# Patient Record
Sex: Female | Born: 1990 | Race: Black or African American | Hispanic: No | Marital: Single | Smoking: Former smoker
Health system: Southern US, Community
[De-identification: ages and names within clinical notes are randomized; demographics above are authoritative.]

## PROBLEM LIST (undated history)

## (undated) ENCOUNTER — Inpatient Hospital Stay (HOSPITAL_COMMUNITY): Payer: Self-pay

## (undated) DIAGNOSIS — F419 Anxiety disorder, unspecified: Secondary | ICD-10-CM

## (undated) DIAGNOSIS — J45909 Unspecified asthma, uncomplicated: Secondary | ICD-10-CM

## (undated) DIAGNOSIS — B999 Unspecified infectious disease: Secondary | ICD-10-CM

## (undated) DIAGNOSIS — J069 Acute upper respiratory infection, unspecified: Secondary | ICD-10-CM

## (undated) DIAGNOSIS — B029 Zoster without complications: Secondary | ICD-10-CM

## (undated) DIAGNOSIS — O24419 Gestational diabetes mellitus in pregnancy, unspecified control: Secondary | ICD-10-CM

## (undated) DIAGNOSIS — F32A Depression, unspecified: Secondary | ICD-10-CM

## (undated) DIAGNOSIS — D649 Anemia, unspecified: Secondary | ICD-10-CM

## (undated) DIAGNOSIS — F329 Major depressive disorder, single episode, unspecified: Secondary | ICD-10-CM

## (undated) HISTORY — PX: HAND TENDON SURGERY: SHX663

## (undated) HISTORY — PX: DILATION AND CURETTAGE OF UTERUS: SHX78

## (undated) HISTORY — PX: WISDOM TOOTH EXTRACTION: SHX21

---
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
## 2013-01-28 ENCOUNTER — Inpatient Hospital Stay (HOSPITAL_COMMUNITY): Payer: Self-pay

## 2013-01-28 ENCOUNTER — Inpatient Hospital Stay (HOSPITAL_COMMUNITY)
Admission: AD | Admit: 2013-01-28 | Discharge: 2013-01-28 | Disposition: A | Discharge status: Home / Self Care | Payer: Self-pay | Source: Ambulatory Visit | Attending: Obstetrics & Gynecology | Admitting: Obstetrics & Gynecology

## 2013-01-28 ENCOUNTER — Encounter (HOSPITAL_COMMUNITY): Payer: Self-pay

## 2013-01-28 DIAGNOSIS — R079 Chest pain, unspecified: Secondary | ICD-10-CM | POA: Insufficient documentation

## 2013-01-28 DIAGNOSIS — R109 Unspecified abdominal pain: Secondary | ICD-10-CM | POA: Insufficient documentation

## 2013-01-28 DIAGNOSIS — M549 Dorsalgia, unspecified: Secondary | ICD-10-CM | POA: Insufficient documentation

## 2013-01-28 HISTORY — DX: Zoster without complications: B02.9

## 2013-01-28 LAB — COMPREHENSIVE METABOLIC PANEL
AST: 12 U/L (ref 0–37)
BUN: 10 mg/dL (ref 6–23)
CO2: 26 mEq/L (ref 19–32)
Calcium: 9.3 mg/dL (ref 8.4–10.5)
Creatinine, Ser: 0.69 mg/dL (ref 0.50–1.10)
GFR calc Af Amer: >90 mL/min (ref >90)
GFR calc non Af Amer: >90 mL/min (ref >90)
Glucose, Bld: 98 mg/dL (ref 70–99)

## 2013-01-28 LAB — LIPASE, BLOOD: Lipase: 35 U/L (ref 11–59)

## 2013-01-28 LAB — AMYLASE: Amylase: 51 U/L (ref 0–105)

## 2013-01-28 LAB — CBC
HCT: 34.8 % — ABNORMAL LOW (ref 36.0–46.0)
MCH: 28.1 pg (ref 26.0–34.0)
MCV: 85.1 fL (ref 78.0–100.0)
Platelets: 204 10*3/uL (ref 150–400)
RBC: 4.09 MIL/uL (ref 3.87–5.11)

## 2013-01-28 MED ORDER — CYCLOBENZAPRINE HCL 10 MG PO TABS: 10.0000 mg | ORAL_TABLET | Freq: Three times a day (TID) | ORAL | Status: DC | PRN

## 2013-01-28 MED ORDER — HYDROMORPHONE HCL PF 2 MG/ML IJ SOLN: 2.0000 mg | Freq: Once | INTRAMUSCULAR | Status: DC

## 2013-01-28 MED ORDER — KETOROLAC TROMETHAMINE 60 MG/2ML IM SOLN
60.0000 mg | Freq: Once | INTRAMUSCULAR | Status: AC
  Administered 2013-01-28: 60 mg via INTRAMUSCULAR
  Filled 2013-01-28: qty 2

## 2013-01-28 NOTE — MAU Note (Signed)
During current visit when asked pt states she is homeless. Recently came here from [place] and has been to shelter in [place]. Told nurse she does not have way to get back to [facility]. Pt works at [employer] on [address] and walked here [employer]. When asked if she has anywhere to go she states does not. Threasa Heads CNM made aware. Will keep pt here until am when can get social work consult.

## 2013-01-28 NOTE — MAU Note (Signed)
Social worker to see patient  Pt discharged home with resources for housing Pt given 2 bus passes and seemed comfortable leaving given the resources.

## 2013-01-28 NOTE — MAU Note (Signed)
Patient states that she has been having upper abdominal pain radiating downward for about a week, has had some N/V as well.

## 2013-01-28 NOTE — MAU Provider Note (Signed)
Attestation of Attending Supervision of Advanced Practitioner (PA/CNM/NP): Evaluation and management procedures were performed by the Advanced Practitioner under my supervision and collaboration.  I have reviewed the Advanced Practitioner's note and chart, and I agree with the management and plan.  Tarrence Enck, MD, FACOG Attending Obstetrician & Gynecologist Faculty Practice, [facility]  

## 2013-01-28 NOTE — MAU Provider Note (Signed)
History     CSN: [id number]  Arrival date and time: 01/28/13 0056   None     Chief Complaint  Patient presents with  . Chest Pain  . Abdominal Pain   HPI Patient reports upper abdominal pain that started 1.5 wks ago.  Pain is reported as an intense dull pain that shoots thru to back.  +nausea and vomiting x 1 today.  No report of fever, body aches, or chills.  Last meal reported was 24 hours ago.  Also inquires if it could possibly be back spasms.      Past Medical History  Diagnosis Date  . Shingles     History reviewed. No pertinent past surgical history.  No family history on file.  History  Substance Use Topics  . Smoking status: Not on file  . Smokeless tobacco: Not on file  . Alcohol Use: No    Allergies: No Known Allergies  No prescriptions prior to admission    Review of Systems  Constitutional: Negative.   Gastrointestinal: Positive for nausea, vomiting and abdominal pain. Negative for diarrhea and constipation.  Genitourinary: Negative.    Physical Exam   Blood pressure 109/58, pulse 78, temperature 98.2 F (36.8 C), resp. rate 18, height 5\' 7"  (1.702 m), weight 78.109 kg (172 lb 3.2 oz), last menstrual period 01/24/2013, SpO2 100.00%.  Physical Exam  Constitutional: She is oriented to person, place, and time. She appears well-developed and well-nourished. No distress.  Appears fatigued; carries bag with lots of personal belongings  HENT:  Head: Normocephalic.  Neck: Normal range of motion. Neck supple.  Cardiovascular: Normal rate, regular rhythm and normal heart sounds.   Respiratory: Effort normal and breath sounds normal.  GI: Soft. Bowel sounds are normal.  Musculoskeletal: Normal range of motion. She exhibits no edema.  Neurological: She is alert and oriented to person, place, and time. She has normal reflexes.  Skin: Skin is warm and dry.    MAU Course  Procedures  Results for orders placed during the hospital encounter of 01/28/13  (from the past 24 hour(s))  CBC     Status: Abnormal   Collection Time    01/28/13  1:56 AM      Result Value Range   WBC 3.9 (*) 4.0 - 10.5 K/uL   RBC 4.09  3.87 - 5.11 MIL/uL   Hemoglobin 11.5 (*) 12.0 - 15.0 g/dL   HCT 40.9 (*) 81.1 - 91.4 %   MCV 85.1  78.0 - 100.0 fL   MCH 28.1  26.0 - 34.0 pg   MCHC 33.0  30.0 - 36.0 g/dL   RDW 78.2  95.6 - 21.3 %   Platelets 204  150 - 400 K/uL  COMPREHENSIVE METABOLIC PANEL     Status: None   Collection Time    01/28/13  1:56 AM      Result Value Range   Sodium 138  135 - 145 mEq/L   Potassium 3.5  3.5 - 5.1 mEq/L   Chloride 103  96 - 112 mEq/L   CO2 26  19 - 32 mEq/L   Glucose, Bld 98  70 - 99 mg/dL   BUN 10  6 - 23 mg/dL   Creatinine, Ser 0.86  0.50 - 1.10 mg/dL   Calcium 9.3  8.4 - 57.8 mg/dL   Total Protein 6.5  6.0 - 8.3 g/dL   Albumin 3.6  3.5 - 5.2 g/dL   AST 12  0 - 37 U/L   ALT 7  0 - 35 U/L   Alkaline Phosphatase 64  39 - 117 U/L   Total Bilirubin 0.3  0.3 - 1.2 mg/dL   GFR calc non Af Amer >90  >90 mL/min   GFR calc Af Amer >90  >90 mL/min  HCG, QUANTITATIVE, PREGNANCY     Status: None   Collection Time    01/28/13  1:56 AM      Result Value Range   hCG, Beta Chain, Quant, S <1  <5 mIU/mL   Abdominal Ultrasound: Normal  Pain relieved with IM Toradol  Assessment and Plan  Back Pain  Plan: DC to home RX Flexeril Follow-up prn  [facility] 01/28/2013, 1:38 AM

## 2013-01-28 NOTE — MAU Note (Signed)
Been having pains in upper stomach and chest for a week. Goes around to back. Feels like something sitting on my chest.

## 2013-01-28 NOTE — MAU Note (Signed)
Social worker called to assess patient

## 2013-01-28 NOTE — Progress Notes (Signed)
Phyllis Phillips CNM in to discuss lab and u/s results and plan of care.

## 2013-01-31 LAB — DRUG SCREEN PANEL (SERUM)

## 2013-02-19 ENCOUNTER — Encounter (HOSPITAL_COMMUNITY): Payer: Self-pay | Admitting: Emergency Medicine

## 2013-02-19 ENCOUNTER — Emergency Department (HOSPITAL_COMMUNITY)
Admission: EM | Admit: 2013-02-19 | Discharge: 2013-02-19 | Disposition: A | Discharge status: Home / Self Care | Payer: Self-pay | Attending: Emergency Medicine | Admitting: Emergency Medicine

## 2013-02-19 ENCOUNTER — Emergency Department (HOSPITAL_COMMUNITY): Payer: Self-pay

## 2013-02-19 DIAGNOSIS — N72 Inflammatory disease of cervix uteri: Secondary | ICD-10-CM

## 2013-02-19 DIAGNOSIS — Y939 Activity, unspecified: Secondary | ICD-10-CM | POA: Insufficient documentation

## 2013-02-19 DIAGNOSIS — N949 Unspecified condition associated with female genital organs and menstrual cycle: Secondary | ICD-10-CM | POA: Insufficient documentation

## 2013-02-19 DIAGNOSIS — S93609A Unspecified sprain of unspecified foot, initial encounter: Secondary | ICD-10-CM | POA: Insufficient documentation

## 2013-02-19 DIAGNOSIS — Z87898 Personal history of other specified conditions: Secondary | ICD-10-CM | POA: Insufficient documentation

## 2013-02-19 DIAGNOSIS — X500XXA Overexertion from strenuous movement or load, initial encounter: Secondary | ICD-10-CM | POA: Insufficient documentation

## 2013-02-19 DIAGNOSIS — A5609 Other chlamydial infection of lower genitourinary tract: Secondary | ICD-10-CM | POA: Insufficient documentation

## 2013-02-19 DIAGNOSIS — N898 Other specified noninflammatory disorders of vagina: Secondary | ICD-10-CM | POA: Insufficient documentation

## 2013-02-19 DIAGNOSIS — S93602A Unspecified sprain of left foot, initial encounter: Secondary | ICD-10-CM

## 2013-02-19 DIAGNOSIS — Y929 Unspecified place or not applicable: Secondary | ICD-10-CM | POA: Insufficient documentation

## 2013-02-19 MED ORDER — AZITHROMYCIN 250 MG PO TABS
1000.0000 mg | ORAL_TABLET | Freq: Once | ORAL | Status: AC
  Administered 2013-02-19: 1000 mg via ORAL
  Filled 2013-02-19: qty 4

## 2013-02-19 MED ORDER — IBUPROFEN 600 MG PO TABS: 600.0000 mg | ORAL_TABLET | Freq: Four times a day (QID) | ORAL | Status: DC | PRN

## 2013-02-19 MED ORDER — CEFTRIAXONE SODIUM 250 MG IJ SOLR
250.0000 mg | Freq: Once | INTRAMUSCULAR | Status: AC
  Administered 2013-02-19: 250 mg via INTRAMUSCULAR
  Filled 2013-02-19: qty 250

## 2013-02-19 NOTE — ED Notes (Signed)
Pt c/o L foot pain after tripping on concrete 2 weeks ago. Pt also would like vagina checked, pt states she had intercourse 4 night ago continues to have pain. Denies bleeding or discharge.

## 2013-02-19 NOTE — ED Provider Notes (Signed)
History     CSN: [id number]  Arrival date & time 02/19/13  0330   First MD Initiated Contact with Patient 02/19/13 0344      Chief Complaint  Patient presents with  . Foot Pain    (Consider location/radiation/quality/duration/timing/severity/associated sxs/prior treatment) HPI Comments: Patient states she tripped over concrete barrier 2 weeks twisting her foot it has been painful since, also had painful intercourse 4 days ago and has ahd vaginal pain since.  States it was so painful that she had to stop, states was using not using condom denies vaginal bleeding or discharge   Patient is a 22 y.o. female presenting with lower extremity pain. The history is provided by the patient.  Foot Pain This is a new problem. The current episode started 1 to 4 weeks ago. The problem occurs constantly. The problem has been unchanged. The symptoms are aggravated by exertion. She has tried acetaminophen for the symptoms. The treatment provided mild relief.    Past Medical History  Diagnosis Date  . Shingles     Past Surgical History  Procedure Laterality Date  . Wisdom tooth extraction      No family history on file.  History  Substance Use Topics  . Smoking status: Never Smoker   . Smokeless tobacco: Not on file  . Alcohol Use: Yes     Comment: social    OB History   Grav Para Term Preterm Abortions TAB SAB Ect Mult Living   2 1   1 1    1       Review of Systems  Allergies  Review of patient's allergies indicates no known allergies.  Home Medications   Current Outpatient Rx  Name  Route  Sig  Dispense  Refill  . acetaminophen (TYLENOL) 500 MG tablet   Oral   Take 500 mg by mouth every 6 (six) hours as needed for pain (pain).           BP 118/88  Pulse 77  Temp(Src) 98.3 F (36.8 C) (Oral)  Resp 16  Ht 5\' 7"  (1.702 m)  Wt 175 lb (79.379 kg)  BMI 27.4 kg/m2  SpO2 100%  LMP 01/24/2013  Physical Exam  Constitutional: She appears well-developed and  well-nourished.  HENT:  Head: Normocephalic.  Eyes: Pupils are equal, round, and reactive to light.  Neck: Normal range of motion. Neck supple.  Abdominal: Soft.  Genitourinary: There is no rash, tenderness or injury on the right labia. There is no rash, tenderness or injury on the left labia. There is erythema and tenderness around the vagina. Vaginal discharge found.  copious whit thick discharge with tender swollen vaginal  walls   Musculoskeletal: She exhibits tenderness. She exhibits no edema.    ED Course  Procedures (including critical care time)  Labs Reviewed  WET PREP, GENITAL - Abnormal; Notable for the following:    Clue Cells Wet Prep HPF POC RARE (*)    WBC, Wet Prep HPF POC FEW (*)    All other components within normal limits  GC/CHLAMYDIA PROBE AMP   Dg Foot Complete Left  02/19/2013   *RADIOLOGY REPORT*  Clinical Data: Twisting injury to the left foot, with pain across the metatarsals.  LEFT FOOT - COMPLETE 3+ VIEW  Comparison: None.  Findings: There is no evidence of fracture or dislocation.  The joint spaces are preserved.  There is no evidence of talar subluxation; the subtalar joint is unremarkable in appearance.  No significant soft tissue abnormalities are seen.  IMPRESSION: No evidence of fracture or dislocation.   Original Report Authenticated By: Tonia Ghent, M.D.     1. Foot sprain, left, initial encounter   2. Cervicitis       MDM   Will place an ACE for support of the foot patient has been instructed to FU at [facility] clinic         Arman Filter, NP 02/19/13 0530

## 2013-02-19 NOTE — ED Provider Notes (Signed)
Medical screening examination/treatment/procedure(s) were performed by non-physician practitioner and as supervising physician I was immediately available for consultation/collaboration.   Lyanne Co, MD 02/19/13 0530

## 2013-02-21 LAB — GC/CHLAMYDIA PROBE AMP
CT Probe RNA: POSITIVE — AB
GC Probe RNA: NEGATIVE

## 2013-02-23 ENCOUNTER — Telehealth (HOSPITAL_COMMUNITY): Payer: Self-pay | Admitting: Emergency Medicine

## 2013-02-23 NOTE — ED Notes (Signed)
+   Chlamydia Patient treated with Rocephin And Zithromax-[facility] faxed 

## 2014-07-29 ENCOUNTER — Encounter (HOSPITAL_COMMUNITY): Payer: Self-pay | Admitting: Emergency Medicine

## 2016-09-27 NOTE — L&D Delivery Note (Signed)
Patient was started on IOL 13 hours ago due to a BPP of 2/8 from routine post-date exam. Patient had reactive NST all day during labor, progressed to complete with pitocin, with moderate meconium stained fluid on SROM, pushed well during 6 contractions and delivered uncomplicated NSVD.   26 y.o. [id number] at 4275w0d delivered a viable female infant in cephalic, OA position. No nuchal cord. Left anterior shoulder delivered with ease. 60 sec delayed cord clamping. Cord clamped x2 and cut. Arterial cord gas collected. Placenta delivered spontaneously intact, with 3VC. Fundus firm on exam with massage and pitocin. Good hemostasis noted.  Anesthesia: Epidural Laceration: 1st degree perineal; right labial Suture: 4-0 Monocryl Good hemostasis noted. EBL: 150cc  Mom and baby recovering in LDR.    Apgars: APGAR (1 MIN): 8   APGAR (5 MINS): 9   Weight: Pending skin to skin  Arterial cord pH: 7.17  Sponge and instrument count were correct x2. Placenta sent to pathology.  Jen MowElizabeth Mumaw, DO OB Fellow Center [facility], [facility] 03/22/2017, 1:22 AM

## 2016-09-28 ENCOUNTER — Encounter (HOSPITAL_COMMUNITY): Payer: Self-pay

## 2016-09-28 ENCOUNTER — Inpatient Hospital Stay (HOSPITAL_COMMUNITY)
Admission: AD | Admit: 2016-09-28 | Discharge: 2016-09-28 | Disposition: A | Discharge status: Home / Self Care | Payer: Medicaid Other | Source: Ambulatory Visit | Attending: Obstetrics & Gynecology | Admitting: Obstetrics & Gynecology

## 2016-09-28 DIAGNOSIS — Z79899 Other long term (current) drug therapy: Secondary | ICD-10-CM | POA: Insufficient documentation

## 2016-09-28 DIAGNOSIS — M545 Low back pain: Secondary | ICD-10-CM | POA: Diagnosis not present

## 2016-09-28 DIAGNOSIS — O99891 Other specified diseases and conditions complicating pregnancy: Secondary | ICD-10-CM

## 2016-09-28 DIAGNOSIS — Z3A16 16 weeks gestation of pregnancy: Secondary | ICD-10-CM | POA: Insufficient documentation

## 2016-09-28 DIAGNOSIS — R102 Pelvic and perineal pain: Secondary | ICD-10-CM | POA: Diagnosis not present

## 2016-09-28 DIAGNOSIS — O26892 Other specified pregnancy related conditions, second trimester: Secondary | ICD-10-CM | POA: Diagnosis not present

## 2016-09-28 DIAGNOSIS — O99512 Diseases of the respiratory system complicating pregnancy, second trimester: Secondary | ICD-10-CM | POA: Diagnosis not present

## 2016-09-28 DIAGNOSIS — R109 Unspecified abdominal pain: Secondary | ICD-10-CM | POA: Diagnosis present

## 2016-09-28 DIAGNOSIS — J45909 Unspecified asthma, uncomplicated: Secondary | ICD-10-CM | POA: Insufficient documentation

## 2016-09-28 DIAGNOSIS — M549 Dorsalgia, unspecified: Secondary | ICD-10-CM

## 2016-09-28 DIAGNOSIS — O9989 Other specified diseases and conditions complicating pregnancy, childbirth and the puerperium: Secondary | ICD-10-CM

## 2016-09-28 DIAGNOSIS — N949 Unspecified condition associated with female genital organs and menstrual cycle: Secondary | ICD-10-CM

## 2016-09-28 HISTORY — DX: Major depressive disorder, single episode, unspecified: F32.9

## 2016-09-28 HISTORY — DX: Depression, unspecified: F32.A

## 2016-09-28 HISTORY — DX: Unspecified asthma, uncomplicated: J45.909

## 2016-09-28 HISTORY — DX: Anxiety disorder, unspecified: F41.9

## 2016-09-28 LAB — OB RESULTS CONSOLE GBS: STREP GROUP B AG: POSITIVE

## 2016-09-28 LAB — POCT PREGNANCY, URINE: Preg Test, Ur: POSITIVE — AB

## 2016-09-28 LAB — CBC
HCT: 30.1 % — ABNORMAL LOW (ref 36.0–46.0)
Hemoglobin: 10.2 g/dL — ABNORMAL LOW (ref 12.0–15.0)
MCH: 27.6 pg (ref 26.0–34.0)
MCHC: 33.9 g/dL (ref 30.0–36.0)
MCV: 81.4 fL (ref 78.0–100.0)
PLATELETS: 231 10*3/uL (ref 150–400)
RBC: 3.7 MIL/uL — AB (ref 3.87–5.11)
RDW: 14.4 % (ref 11.5–15.5)
WBC: 5.7 10*3/uL (ref 4.0–10.5)

## 2016-09-28 LAB — WET PREP, GENITAL
CLUE CELLS WET PREP: NONE SEEN
SPERM: NONE SEEN
TRICH WET PREP: NONE SEEN
YEAST WET PREP: NONE SEEN

## 2016-09-28 LAB — URINALYSIS, ROUTINE W REFLEX MICROSCOPIC
Bilirubin Urine: NEGATIVE
GLUCOSE, UA: NEGATIVE mg/dL
Hgb urine dipstick: NEGATIVE
Ketones, ur: 80 mg/dL — AB
NITRITE: NEGATIVE
Protein, ur: 30 mg/dL — AB
Specific Gravity, Urine: 1.029 (ref 1.005–1.030)
pH: 5 (ref 5.0–8.0)

## 2016-09-28 MED ORDER — CYCLOBENZAPRINE HCL 10 MG PO TABS: 10.0000 mg | ORAL_TABLET | Freq: Three times a day (TID) | ORAL | 0 refills | Status: DC | PRN
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
## 2016-09-28 NOTE — Discharge Instructions (Signed)
Back Pain in Pregnancy Introduction Back pain during pregnancy is common. Back pain may be caused by several factors that are related to changes during your pregnancy. Follow these instructions at home: Managing pain, stiffness, and swelling  If directed, apply ice for sudden (acute) back pain.  Put ice in a plastic bag.  Place a towel between your skin and the bag.  Leave the ice on for 20 minutes, 2-3 times per day.  If directed, apply heat to the affected area before you exercise:  Place a towel between your skin and the heat pack or heating pad.  Leave the heat on for 20-30 minutes.  Remove the heat if your skin turns bright red. This is especially important if you are unable to feel pain, heat, or cold. You may have a greater risk of getting burned. Activity  Exercise as told by your health care provider. Exercising is the best way to prevent or manage back pain.  Listen to your body when lifting. If lifting hurts, ask for help or bend your knees. This uses your leg muscles instead of your back muscles.  Squat down when picking up something from the floor. Do not bend over.  Only use bed rest as told by your health care provider. Bed rest should only be used for the most severe episodes of back pain. Standing, Sitting, and Lying Down  Do not stand in one place for long periods of time.  Use good posture when sitting. Make sure your head rests over your shoulders and is not hanging forward. Use a pillow on your lower back if necessary.  Try sleeping on your side, preferably the left side, with a pillow or two between your legs. If you are sore after a night's rest, your bed may be too soft. A firm mattress may provide more support for your back during pregnancy. General instructions  Do not wear high heels.  Eat a healthy diet. Try to gain weight within your health care provider's recommendations.  Use a maternity girdle, elastic sling, or back brace as told by your  health care provider.  Take over-the-counter and prescription medicines only as told by your health care provider.  Keep all follow-up visits as told by your health care provider. This is important. This includes any visits with any specialists, such as a physical therapist. Contact a health care provider if:  Your back pain interferes with your daily activities.  You have increasing pain in other parts of your body. Get help right away if:  You develop numbness, tingling, weakness, or problems with the use of your arms or legs.  You develop severe back pain that is not controlled with medicine.  You have a sudden change in bowel or bladder control.  You develop shortness of breath, dizziness, or you faint.  You develop nausea, vomiting, or sweating.  You have back pain that is a rhythmic, cramping pain similar to labor pains. Labor pain is usually 1-2 minutes apart, lasts for about 1 minute, and involves a bearing down feeling or pressure in your pelvis.  You have back pain and your water breaks or you have vaginal bleeding.  You have back pain or numbness that travels down your leg.  Your back pain developed after you fell.  You develop pain on one side of your back.  You see blood in your urine.  You develop skin blisters in the area of your back pain. This information is not intended to replace advice given to  you by your health care provider. Make sure you discuss any questions you have with your health care provider. °Document Released: 12/22/2005 Document Revised: 02/19/2016 Document Reviewed: 05/28/2015 °© 2017 Elsevier °Round Ligament Pain °Introduction °The round ligament is a cord of muscle and tissue that helps to support the uterus. It can become a source of pain during pregnancy if it becomes stretched or twisted as the baby grows. The pain usually begins in the second trimester of pregnancy, and it can come and go until the baby is delivered. It is not a serious  problem, and it does not cause harm to the baby. °Round ligament pain is usually a short, sharp, and pinching pain, but it can also be a dull, lingering, and aching pain. The pain is felt in the lower side of the abdomen or in the groin. It usually starts deep in the groin and moves up to the outside of the hip area. Pain can occur with: °· A sudden change in position. °· Rolling over in bed. °· Coughing or sneezing. °· Physical activity. °Follow these instructions at home: °Watch your condition for any changes. Take these steps to help with your pain: °· When the pain starts, relax. Then try: °¨ Sitting down. °¨ Flexing your knees up to your abdomen. °¨ Lying on your side with one pillow under your abdomen and another pillow between your legs. °¨ Sitting in a warm bath for 15-20 minutes or until the pain goes away. °· Take over-the-counter and prescription medicines only as told by your health care provider. °· Move slowly when you sit and stand. °· Avoid long walks if they cause pain. °· Stop or lessen your physical activities if they cause pain. °Contact a health care provider if: °· Your pain does not go away with treatment. °· You feel pain in your back that you did not have before. °· Your medicine is not helping. °Get help right away if: °· You develop a fever or chills. °· You develop uterine contractions. °· You develop vaginal bleeding. °· You develop nausea or vomiting. °· You develop diarrhea. °· You have pain when you urinate. °This information is not intended to replace advice given to you by your health care provider. Make sure you discuss any questions you have with your health care provider. °Document Released: 06/22/2008 Document Revised: 02/19/2016 Document Reviewed: 11/20/2014 °© 2017 Elsevier ° °

## 2016-09-28 NOTE — MAU Note (Signed)
Pt presents complaining of hip pain and cramping in her back and side that started about an hour ago. States some days she has the pain but it goes away and today it's not going away. Tried tylenol but it didn't help. Denies vaginal bleeding or discharge. LMP 06/08/16.

## 2016-09-29 LAB — GC/CHLAMYDIA PROBE AMP (~~LOC~~) NOT AT ARMC
Chlamydia: NEGATIVE
NEISSERIA GONORRHEA: NEGATIVE

## 2016-10-06 NOTE — Congregational Nurse Program (Signed)
Congregational Nurse Program Note  Date of Encounter: 09/29/2016  Past Medical History: Past Medical History:  Diagnosis Date  . Anxiety   . Asthma   . Depression   . Shingles     Encounter Details:     CNP Questionnaire - 10/06/16 1711      Patient Demographics   Is this a new or existing patient? New   Patient is considered a/an Not Applicable   Race African-American/Black     Patient Assistance   Location of Patient Assistance Not Applicable   Patient's financial/insurance status Medicaid   Uninsured Patient ([place] Card/Care Connects) No   Patient referred to apply for the following financial assistance Not Applicable   Food insecurities addressed Not Applicable   Transportation assistance No   Assistance securing medications No   Product/process development scientistducational health offerings Navigating the healthcare system;Safety;Interpersonal relationships     Encounter Details   Primary purpose of visit Navigating the Healthcare System;Spiritual Care/Support Visit   Was an Emergency Department visit averted? Not Applicable   Does patient have a medical provider? No   Patient referred to Establish PCP;Nutritionist;Area Agency   Was a mental health screening completed? (GAINS tool) No   Does patient have dental issues? No   Does patient have vision issues? No   Does your patient have an abnormal blood pressure today? No   Since previous encounter, have you referred patient for abnormal blood pressure that resulted in a new diagnosis or medication change? No   Does your patient have an abnormal blood glucose today? No   Since previous encounter, have you referred patient for abnormal blood glucose that resulted in a new diagnosis or medication change? No   Was there a life-saving intervention made? No     New  Client to  [facility]  And was referred  By  Case manager . Client is  About  [redacted] weeks  Pregnant  And has a 651 year old son ,[place]  States  Due to n domestic  Violence  situation, has no  Family  Here states  Her  Mother  Died  When she was young  ,dad  Came into  Her life  Ata late  Stage. Looking  For a new beginning here. [facility]  For  Muscle  Pains ,had  Ultra sound  And was told  Baby  Was active  And that  She was  [redacted] weeks pregnant .States I cant deal with  This  Pregnancy  I  Have to  Give the  Baby up.Doesnt want to  Know the  Sex  Because she is sure she wants to  Give the baby  Up . Marland Kitchen. Hasn't had  Prenatal care to  This  Point . Nurse will  Make  Contact to  Get her into  Care ,will call n Health  Dept . States she  Has  Medicaid. Will meet  With  Client weekly and connect her with  Services to assure she makes the best decision for her family unit.

## 2016-10-07 NOTE — Congregational Nurse Program (Signed)
Congregational Nurse Program Note  Date of Encounter: 10/06/2016  Past Medical History: Past Medical History:  Diagnosis Date  . Anxiety   . Asthma   . Depression   . Shingles     Encounter Details:     CNP Questionnaire - 10/07/16 0051      Patient Demographics   Is this a new or existing patient? Existing   Patient is considered a/an Not Applicable   Race African-American/Black     Patient Assistance   Location of Patient Assistance Not Applicable   Patient's financial/insurance status Medicaid   Uninsured Patient ([place] Card/Care Connects) No   Patient referred to apply for the following financial assistance Medicaid   Food insecurities addressed Not Applicable   Transportation assistance No   Assistance securing medications No   Educational Programmer, systemshealth offerings Navigating the healthcare system     Encounter Details   Primary purpose of visit Navigating the Healthcare System;Spiritual Care/Support Visit;Education/Health Concerns   Was an Emergency Department visit averted? Not Applicable   Does patient have a medical provider? No   Patient referred to Establish PCP;Area Agency   Was a mental health screening completed? (GAINS tool) No   Does patient have dental issues? No   Does patient have vision issues? No   Does your patient have an abnormal blood pressure today? No   Since previous encounter, have you referred patient for abnormal blood pressure that resulted in a new diagnosis or medication change? No   Does your patient have an abnormal blood glucose today? No   Since previous encounter, have you referred patient for abnormal blood glucose that resulted in a new diagnosis or medication change? No   Was there a life-saving intervention made? No    the case manager and get  clarityMade contact  With  DSS [place]  And  [place] on the  [employer]half  Of  Client and was able to identify case manager  And leave a message to  [employer] [place]  [place]  So  [place] . This  Has taken many  callls a  Waiting on the phone  Line  For both client and nurse. Systems are very frustrating . Marland Kitchen. Left messages  And will follow  Up  To see if if  Follow  Up is needed. Case manager at DSS [place]  Felecia  Elliott. [phone].. Nurse made  Referral  To student SW. Mother will need support  And  Counseling  As today  She  Stated if I could  Sign a paper today  To  Give the  Baby up  I would . Nurse counseled  And was supportive of  Mothers decision and will assure she  Gets  Resources in place to carry out her wishes .Coming down with a cold  Or  Has allergies . Will follow  weekly

## 2016-10-07 NOTE — Congregational Nurse Program (Signed)
Congregational Nurse Program Note  Date of Encounter: 10/05/2016  Past Medical History: Past Medical History:  Diagnosis Date  . Anxiety   . Asthma   . Depression   . Shingles     Encounter Details:     CNP Questionnaire - 10/07/16 0019      Patient Demographics   Is this a new or existing patient? Existing   Patient is considered a/an Not Applicable   Race African-American/Black     Patient Assistance   Location of Patient Assistance Not Applicable   Patient's financial/insurance status Medicaid   Uninsured Patient ([place] Card/Care Connects) No   Patient referred to apply for the following financial assistance Medicaid   Food insecurities addressed Not Applicable   Transportation assistance No   Assistance securing medications No   Educational Programmer, systemshealth offerings Navigating the healthcare system     Encounter Details   Primary purpose of visit Education/Health Concerns;Navigating the Healthcare System;Spiritual Care/Support Visit   Was an Emergency Department visit averted? Not Applicable   Does patient have a medical provider? No   Patient referred to Establish PCP;Area Agency   Was a mental health screening completed? (GAINS tool) No   Does patient have dental issues? No   Does patient have vision issues? No   Does your patient have an abnormal blood pressure today? No   Since previous encounter, have you referred patient for abnormal blood pressure that resulted in a new diagnosis or medication change? No   Does your patient have an abnormal blood glucose today? No   Since previous encounter, have you referred patient for abnormal blood glucose that resulted in a new diagnosis or medication change? No   Was there a life-saving intervention made? No    Nurse called  For  Client to  [place] to get  Medicaid  Card  [place]  Was told  It  Could  Be  Done  At  [place]  [place] , nurse will try to call for  Client. On tomorrow  As offices   Were now  closed Today  Client gives  Nurse more  Information on her  History . States she had had a total of  4 pregnancies , 1 terminated , one  Miscarriage  And now  One  She just  Cant handle now. Client talked  About  her childhood  And how  She was treated during her  Childhood. States  She called the  DSS numbers  But  Got  No where ,very frustrating. Has not called to get her son a PCP yet  Trying to get medicaid  Straight . Still sure she wants to give this  Child  Up . Nurse listened and gave support and counseling regarding making the right decision for her and her family .Nurse will monitor closely . Appointment for her prenatal care has been made .contact with Maternity coordinated   Made left a message for her to call now that appointment has been made.Will follow up with client tomorrow as we try to get  Medicaid card at  [place]SS [place]switched .

## 2016-10-07 NOTE — Congregational Nurse Program (Signed)
Congregational Nurse Program Note  Date of Encounter: 10/05/2016  Past Medical History: Past Medical History:  Diagnosis Date  . Anxiety   . Asthma   . Depression   . Shingles     Encounter Details:     CNP Questionnaire - 10/07/16 0019      Patient Demographics   Is this a new or existing patient? Existing   Patient is considered a/an Not Applicable   Race African-American/Black     Patient Assistance   Location of Patient Assistance Not Applicable   Patient's financial/insurance status Medicaid   Uninsured Patient ([place] Card/Care Connects) No   Patient referred to apply for the following financial assistance Medicaid   Food insecurities addressed Not Applicable   Transportation assistance No   Assistance securing medications No   Educational Programmer, systemshealth offerings Navigating the healthcare system     Encounter Details   Primary purpose of visit Education/Health Concerns;Navigating the Healthcare System;Spiritual Care/Support Visit   Was an Emergency Department visit averted? Not Applicable   Does patient have a medical provider? No   Patient referred to Establish PCP;Area Agency   Was a mental health screening completed? (GAINS tool) No   Does patient have dental issues? No   Does patient have vision issues? No   Does your patient have an abnormal blood pressure today? No   Since previous encounter, have you referred patient for abnormal blood pressure that resulted in a new diagnosis or medication change? No   Does your patient have an abnormal blood glucose today? No   Since previous encounter, have you referred patient for abnormal blood glucose that resulted in a new diagnosis or medication change? No   Was there a life-saving intervention made? No

## 2016-10-11 ENCOUNTER — Encounter (HOSPITAL_COMMUNITY): Payer: Self-pay | Admitting: Emergency Medicine

## 2016-10-11 ENCOUNTER — Emergency Department (HOSPITAL_COMMUNITY)
Admission: EM | Admit: 2016-10-11 | Discharge: 2016-10-11 | Disposition: A | Discharge status: Home / Self Care | Payer: Medicaid Other | Attending: Emergency Medicine | Admitting: Emergency Medicine

## 2016-10-11 ENCOUNTER — Emergency Department (HOSPITAL_COMMUNITY): Payer: Medicaid Other

## 2016-10-11 DIAGNOSIS — R109 Unspecified abdominal pain: Secondary | ICD-10-CM

## 2016-10-11 DIAGNOSIS — J45909 Unspecified asthma, uncomplicated: Secondary | ICD-10-CM | POA: Insufficient documentation

## 2016-10-11 DIAGNOSIS — K219 Gastro-esophageal reflux disease without esophagitis: Secondary | ICD-10-CM

## 2016-10-11 DIAGNOSIS — Z79899 Other long term (current) drug therapy: Secondary | ICD-10-CM | POA: Diagnosis not present

## 2016-10-11 DIAGNOSIS — Z3A18 18 weeks gestation of pregnancy: Secondary | ICD-10-CM | POA: Insufficient documentation

## 2016-10-11 DIAGNOSIS — O210 Mild hyperemesis gravidarum: Secondary | ICD-10-CM | POA: Insufficient documentation

## 2016-10-11 DIAGNOSIS — R197 Diarrhea, unspecified: Secondary | ICD-10-CM

## 2016-10-11 DIAGNOSIS — R112 Nausea with vomiting, unspecified: Secondary | ICD-10-CM

## 2016-10-11 LAB — URINALYSIS, ROUTINE W REFLEX MICROSCOPIC
BILIRUBIN URINE: NEGATIVE
Glucose, UA: NEGATIVE mg/dL
HGB URINE DIPSTICK: NEGATIVE
Ketones, ur: 20 mg/dL — AB
Leukocytes, UA: NEGATIVE
Nitrite: NEGATIVE
Protein, ur: NEGATIVE mg/dL
SPECIFIC GRAVITY, URINE: 1.018 (ref 1.005–1.030)
pH: 7 (ref 5.0–8.0)

## 2016-10-11 LAB — COMPREHENSIVE METABOLIC PANEL
ALBUMIN: 3.1 g/dL — AB (ref 3.5–5.0)
ALK PHOS: 67 U/L (ref 38–126)
ALT: 17 U/L (ref 14–54)
AST: 19 U/L (ref 15–41)
Anion gap: 10 (ref 5–15)
BUN: 11 mg/dL (ref 6–20)
CALCIUM: 8.7 mg/dL — AB (ref 8.9–10.3)
CO2: 21 mmol/L — AB (ref 22–32)
CREATININE: 0.54 mg/dL (ref 0.44–1.00)
Chloride: 108 mmol/L (ref 101–111)
GFR calc Af Amer: >60 mL/min (ref >60)
GFR calc non Af Amer: >60 mL/min (ref >60)
GLUCOSE: 99 mg/dL (ref 65–99)
Potassium: 3.3 mmol/L — ABNORMAL LOW (ref 3.5–5.1)
SODIUM: 139 mmol/L (ref 135–145)
Total Bilirubin: 0.2 mg/dL — ABNORMAL LOW (ref 0.3–1.2)
Total Protein: 6.6 g/dL (ref 6.5–8.1)

## 2016-10-11 LAB — CBC
HCT: 32.3 % — ABNORMAL LOW (ref 36.0–46.0)
HEMOGLOBIN: 10.8 g/dL — AB (ref 12.0–15.0)
MCH: 26.9 pg (ref 26.0–34.0)
MCHC: 33.4 g/dL (ref 30.0–36.0)
MCV: 80.3 fL (ref 78.0–100.0)
PLATELETS: 226 10*3/uL (ref 150–400)
RBC: 4.02 MIL/uL (ref 3.87–5.11)
RDW: 14.1 % (ref 11.5–15.5)
WBC: 6.6 10*3/uL (ref 4.0–10.5)

## 2016-10-11 LAB — I-STAT BETA HCG BLOOD, ED (MC, WL, AP ONLY)

## 2016-10-11 LAB — LIPASE, BLOOD: Lipase: 27 U/L (ref 11–51)

## 2016-10-11 MED ORDER — SODIUM CHLORIDE 0.9 % IV BOLUS (SEPSIS)
1000.0000 mL | Freq: Once | INTRAVENOUS | Status: AC
  Administered 2016-10-11: 1000 mL via INTRAVENOUS

## 2016-10-11 MED ORDER — FAMOTIDINE IN NACL 20-0.9 MG/50ML-% IV SOLN
20.0000 mg | Freq: Once | INTRAVENOUS | Status: AC
  Administered 2016-10-11: 20 mg via INTRAVENOUS
  Filled 2016-10-11: qty 50

## 2016-10-11 MED ORDER — ONDANSETRON HCL 4 MG/2ML IJ SOLN
4.0000 mg | Freq: Once | INTRAMUSCULAR | Status: AC
  Administered 2016-10-11: 4 mg via INTRAVENOUS
  Filled 2016-10-11: qty 2

## 2016-10-11 MED ORDER — HYDROMORPHONE HCL 1 MG/ML IJ SOLN
1.0000 mg | Freq: Once | INTRAMUSCULAR | Status: AC
  Administered 2016-10-11: 1 mg via INTRAVENOUS
  Filled 2016-10-11: qty 1

## 2016-10-11 MED ORDER — ONDANSETRON 4 MG PO TBDP: 4.0000 mg | ORAL_TABLET | Freq: Three times a day (TID) | ORAL | 0 refills | Status: DC | PRN

## 2016-10-11 MED ORDER — ONDANSETRON 4 MG PO TBDP
4.0000 mg | ORAL_TABLET | Freq: Once | ORAL | Status: AC | PRN
  Administered 2016-10-11: 4 mg via ORAL
  Filled 2016-10-11: qty 1

## 2016-10-11 NOTE — ED Notes (Signed)
Pt is still not able to urinate. Running 2nd bag of fluids and pt will try again.

## 2016-10-11 NOTE — ED Notes (Signed)
ED Provider at bedside. 

## 2016-10-11 NOTE — ED Notes (Signed)
Gave pt water and crackers for PO challenge. 

## 2016-10-11 NOTE — ED Notes (Signed)
US at bedside

## 2016-10-11 NOTE — ED Notes (Signed)
Bed: ZO10WA10 Expected date:  Expected time:  Means of arrival:  Comments: 26 yo F-NVD

## 2016-10-11 NOTE — ED Provider Notes (Signed)
[facility]-EMERGENCY DEPT Provider Note   CSN: [id number] Arrival date & time: 10/11/16  0441     History   Chief Complaint Chief Complaint  Patient presents with  . Emesis  . Diarrhea  . Abdominal Pain    HPI Phyllis Phillips is a 26 y.o. female.  Patient presents from [employer], currently pregnant fourth pregnancy, history of miscarriage, no ectopic history presents with suprapubic tenderness intermittent spasming. No vaginal discharge or bleeding. No kidney stone history. No fevers or systemic symptoms.    Emesis   Associated symptoms include abdominal pain and diarrhea. Pertinent negatives include no arthralgias, no chills, no cough and no fever.  Diarrhea   Associated symptoms include abdominal pain and vomiting. Pertinent negatives include no chills, no arthralgias and no cough.  Abdominal Pain   Associated symptoms include diarrhea and vomiting. Pertinent negatives include fever, dysuria, hematuria and arthralgias.    Past Medical History:  Diagnosis Date  . Anxiety   . Asthma   . Depression   . Shingles     There are no active problems to display for this patient.   Past Surgical History:  Procedure Laterality Date  . HAND TENDON SURGERY    . WISDOM TOOTH EXTRACTION      OB History    Gravida Para Term Preterm AB Living   4 1     2 1    SAB TAB Ectopic Multiple Live Births   1 1     1        Home Medications    Prior to Admission medications   Medication Sig Start Date End Date Taking? Authorizing Provider  acetaminophen (TYLENOL) 500 MG tablet Take 500-1,000 mg by mouth every 6 (six) hours as needed for mild pain, moderate pain or headache.    Yes Historical Provider, MD  albuterol (PROVENTIL HFA;VENTOLIN HFA) 108 (90 Base) MCG/ACT inhaler Inhale 2 puffs into the lungs every 6 (six) hours as needed for wheezing or shortness of breath.   Yes Historical Provider, MD  FLUoxetine (PROZAC) 20 MG capsule Take 40 mg by mouth daily.    Yes Historical  Provider, MD    Family History No family history on file.  Social History Social History  Substance Use Topics  . Smoking status: Never Smoker  . Smokeless tobacco: Never Used  . Alcohol use Yes     Comment: social     Allergies   Patient has no known allergies.   Review of Systems Review of Systems  Constitutional: Negative for chills and fever.  HENT: Negative for ear pain and sore throat.   Eyes: Negative for pain and visual disturbance.  Respiratory: Negative for cough and shortness of breath.   Cardiovascular: Negative for chest pain and palpitations.  Gastrointestinal: Positive for abdominal pain, diarrhea and vomiting.  Genitourinary: Negative for dysuria and hematuria.  Musculoskeletal: Negative for arthralgias and back pain.  Skin: Negative for color change and rash.  Neurological: Negative for seizures and syncope.  All other systems reviewed and are negative.    Physical Exam Updated Vital Signs BP (!) 94/47   Pulse 73   Temp 98.3 F (36.8 C) (Oral)   Resp 20   LMP 06/08/2016 (Within Weeks)   SpO2 97%   Physical Exam  Constitutional: She appears well-developed and well-nourished. No distress.  HENT:  Head: Normocephalic and atraumatic.  Eyes: Conjunctivae are normal.  Neck: Neck supple.  Cardiovascular: Normal rate.   Pulmonary/Chest: Effort normal.  Abdominal: Soft. There is tenderness (suprapubic).  Musculoskeletal: She exhibits no edema.  Neurological: She is alert.  Skin: Skin is warm and dry.  Psychiatric: She has a normal mood and affect.  Nursing note and vitals reviewed.    ED Treatments / Results  Labs (all labs ordered are listed, but only abnormal results are displayed) Labs Reviewed  COMPREHENSIVE METABOLIC PANEL - Abnormal; Notable for the following:       Result Value   Potassium 3.3 (*)    CO2 21 (*)    Calcium 8.7 (*)    Albumin 3.1 (*)    Total Bilirubin 0.2 (*)    All other components within normal limits  CBC -  Abnormal; Notable for the following:    Hemoglobin 10.8 (*)    HCT 32.3 (*)    All other components within normal limits  I-STAT BETA HCG BLOOD, ED ([facility], [facility], [facility] ONLY) - Abnormal; Notable for the following:    I-stat hCG, quantitative >2,000.0 (*)    All other components within normal limits  LIPASE, BLOOD  URINALYSIS, ROUTINE W REFLEX MICROSCOPIC    EKG  EKG Interpretation None       Radiology No results found.  Procedures Procedures (including critical care time) EMERGENCY DEPARTMENT US PREGNANCY "Study: Limited Ultrasound of the Pelvis for Pregnancy"  INDICATIONS:Pregnancy(required) Multiple views of the uterus and pelvic cavity were obtained in real-time with a multi-frequency probe.  APPROACH:Transabdominal   PERFORMED BY: Myself  IMAGES ARCHIVED?: Yes  LIMITATIONS: Body habitus  PREGNANCY FREE FLUID: None PREGNANCY FINDINGS: Intrauterine gestational sac noted, Fetal pole present and Fetal heart activity seen  GESTATIONAL AGE, ESTIMATE: 18 wk  FETAL HEART RATE: 130s  CPT Codes:  586-611-173676815-26 (transabdominal OB)  768-26-52 (transvaginal OB, Reduced level of service for incomplete exam)     Medications Ordered in ED Medications  ondansetron (ZOFRAN-ODT) disintegrating tablet 4 mg (4 mg Oral Given 10/11/16 0555)  HYDROmorphone (DILAUDID) injection 1 mg (1 mg Intravenous Given 10/11/16 0555)  sodium chloride 0.9 % bolus 1,000 mL (1,000 mLs Intravenous New Bag/Given 10/11/16 0555)     Initial Impression / Assessment and Plan / ED Course  I have reviewed the triage vital signs and the nursing notes.  Pertinent labs & imaging results that were available during my care of the patient were reviewed by me and considered in my medical decision making (see chart for details).  Clinical Course    Pregnant patient presents with suprapubic pain and spasming. Discussed possibility for urine infection, urinalysis pending. No history of kidney stones, no flank pain.  Discussed may also be kidney stone that is passing through the bladder. Patient overall well-appearing improved on reassessment. Abdominal exam otherwise benign no guarding. No indication for emergent CT scan especially with pregnancy status. Bedside ultrasound showed live intrauterine pregnancy 18 weeks. Patient will need very close follow-up with OB/GYN, patient's care be signed at follow-up urinalysis and reassess.  Results and differential diagnosis were discussed with the patient/parent/guardian.   Medications  ondansetron (ZOFRAN-ODT) disintegrating tablet 4 mg (4 mg Oral Given 10/11/16 0555)  HYDROmorphone (DILAUDID) injection 1 mg (1 mg Intravenous Given 10/11/16 0555)  sodium chloride 0.9 % bolus 1,000 mL (1,000 mLs Intravenous New Bag/Given 10/11/16 0555)    Vitals:   10/11/16 0454 10/11/16 0630 10/11/16 0700  BP: 117/58 (!) 103/51 (!) 94/47  Pulse: 90 78 73  Resp: 20    Temp: 98.3 F (36.8 C)    TempSrc: Oral    SpO2: 100% 92% 97%    Final diagnoses:  Nausea vomiting and diarrhea  [redacted] weeks gestation of pregnancy     Final Clinical Impressions(s) / ED Diagnoses   Final diagnoses:  Nausea vomiting and diarrhea  [redacted] weeks gestation of pregnancy    New Prescriptions New Prescriptions   No medications on file     Blane Ohara, MD 10/11/16 [phone]

## 2016-10-11 NOTE — Discharge Instructions (Signed)
Follow-up with OB doctor. If you were given medicines take as directed.  If you are on coumadin or contraceptives realize their levels and effectiveness is altered by many different medicines.  If you have any reaction (rash, tongues swelling, other) to the medicines stop taking and see a physician.    If your blood pressure was elevated in the ER make sure you follow up for management with a primary doctor or return for chest pain, shortness of breath or stroke symptoms.  Please follow up as directed and return to the ER or see a physician for new or worsening symptoms.  Thank you. Vitals:   10/11/16 0454 10/11/16 0630 10/11/16 0700  BP: 117/58 (!) 103/51 (!) 94/47  Pulse: 90 78 73  Resp: 20    Temp: 98.3 F (36.8 C)    TempSrc: Oral    SpO2: 100% 92% 97%

## 2016-10-11 NOTE — ED Triage Notes (Addendum)
[facility]  Reports that around midnight she started having N/V/D.  Reports lower abd pain that radiates to groin, pressure with urination.  Pt states that she's [redacted] wks pregnant but denies vag pain/discharge/cramping. Vs: 124/60, 61 bpm, 18RR

## 2016-10-11 NOTE — ED Provider Notes (Addendum)
  Physical Exam  BP (!) 98/54 Comment: pt is laying on side resting  Pulse 77   Temp 98.3 F (36.8 C) (Oral)   Resp 15   LMP 06/08/2016 (Within Weeks)   SpO2 100%   Physical Exam  ED Course  Procedures  MDM  Pt is pregnant. She is having lower abd spasms. POCUS shows IUP.  UA is pending, but it appears that pt has UTI / cystitis clinically. PO challenge completed.    Derwood KaplanAnkit Hooper Petteway, MD 10/11/16 [phone]    Results from the ER workup discussed with the patient face to face and all questions answered to the best of my ability.    Derwood KaplanAnkit Ornella Coderre, MD 10/11/16 1253

## 2016-10-11 NOTE — Progress Notes (Signed)
CSW provided bus pass for patient to DC.   Stacy GardnerErin Sheyli Horwitz, LCSWA Clinical Social Worker [phone]

## 2016-10-13 LAB — URINE CULTURE: Culture: 10000 — AB

## 2016-10-13 NOTE — ED Notes (Signed)
Lab called with result of 10,000 colonies of group B strep.  Reviewed with Haywood LassoK. Campos, MD.  No new orders.

## 2016-10-14 ENCOUNTER — Telehealth (HOSPITAL_BASED_OUTPATIENT_CLINIC_OR_DEPARTMENT_OTHER): Payer: Self-pay

## 2016-10-14 NOTE — Telephone Encounter (Signed)
Post ED Visit - Positive Culture Follow-up: Successful Patient Follow-Up  Culture assessed and recommendations reviewed by: []  Enzo BiNathan Batchelder, Pharm.D. []  Celedonio MiyamotoJeremy Frens, Pharm.D., BCPS []  Garvin FilaMike Maccia, Pharm.D. []  Georgina PillionElizabeth Martin, Pharm.D., BCPS []  Northview[place]h Pham, [place].D., BCPS, AAHIVP []  Estella HuskMichelle Turner, Pharm.D., BCPS, AAHIVP []  Tennis Mustassie Stewart, [address]Ph[address].D. []  Sherle Poeob Vincent, [address]Estella Husk. X  Joseph Arminger, Pharm.D.  Positive urine culture, 10,000 colonies -> Group B Strep.  [x]  Patient discharged without antimicrobial prescription and treatment is now indicated []  Organism is resistant to prescribed ED discharge antimicrobial []  Patient with positive blood cultures  Changes discussed with ED provider: Trixie DredgeEmily West PA New antibiotic prescription "Amoxil 500 mg po BID x 5 days" Called to [facility] [phone] and given to RPh  Contacted patient, date 10/14/16, time 1227 Pt informed.  Rx called to [facility] [phone].   Arvid RightClark, Robynne Roat Dorn 10/14/2016, 12:24 PM

## 2016-10-14 NOTE — Progress Notes (Signed)
ED Antimicrobial Stewardship Positive Culture Follow Up   Phyllis Phillips is an 26 y.o. female who [facility] with a chief complaint of  Chief Complaint  Patient presents with  . Emesis  . Diarrhea  . Abdominal Pain    Recent Results (from the past 720 hour(s))  Wet prep, genital     Status: Abnormal   Collection Time: 09/28/16  8:34 PM  Result Value Ref Range Status   Yeast Wet Prep HPF POC NONE SEEN NONE SEEN Final   Trich, Wet Prep NONE SEEN NONE SEEN Final   Clue Cells Wet Prep HPF POC NONE SEEN NONE SEEN Final   WBC, Wet Prep HPF POC FEW (A) NONE SEEN Final    Comment: MODERATE BACTERIA SEEN   Sperm NONE SEEN  Final  Urine culture     Status: Abnormal   Collection Time: 10/11/16  9:10 AM  Result Value Ref Range Status   Specimen Description URINE, CLEAN CATCH  Final   Special Requests NONE  Final   Culture (A)  Final    10,000 COLONIES/mL GROUP B STREP(S.AGALACTIAE)ISOLATED MULTIPLE SPECIES PRESENT, SUGGEST RECOLLECTION CRITICAL RESULT CALLED TO, READ BACK BY AND VERIFIED WITH: RN Quinn AxeJ HAMILTON [phone] MLM Performed at [facility], [address] [place], [place] [address]    Report Status 10/13/2016 FINAL  Final    [x]  Patient discharged originally without antimicrobial agent and treatment is now indicated  New antibiotic prescription: Amoxicillin 500mg  capsules - Take one capsule by mouth twice daily for 5 days   ED Provider: Trixie DredgeEmily West, PA-C  Alfredo BachJoseph Oceania Noori, BS, PharmD Clinical Pharmacy Resident [phone] (Pager) 10/14/2016 9:33 AM

## 2016-10-18 NOTE — Congregational Nurse Program (Signed)
Congregational Nurse Program Note  Date of Encounter: 10/12/2016  Past Medical History: Past Medical History:  Diagnosis Date  . Anxiety   . Asthma   . Depression   . Shingles     Encounter Details:     CNP Questionnaire - 10/18/16 1850      Patient Demographics   Is this a new or existing patient? Existing   Patient is considered a/an Not Applicable   Race African-American/Black     Patient Assistance   Location of Patient Assistance Not Applicable   Patient's financial/insurance status Medicaid   Uninsured Patient ([place] Card/Care Connects) No   Patient referred to apply for the following financial assistance Medicaid   Food insecurities addressed Not Applicable   Transportation assistance No   Assistance securing medications Yes   Type of Patent attorneyAssistance [facility]   Educational health offerings Navigating the healthcare system;Medications;Interpersonal relationships     Encounter Details   Primary purpose of visit Acute Illness/Condition Visit;Navigating the Healthcare System;Spiritual Care/Support Visit;Education/Health Concerns   Was an Emergency Department visit averted? No   Does patient have a medical provider? No   Patient referred to Area Agency;Establish PCP   Was a mental health screening completed? (GAINS tool) No   Does patient have dental issues? No   Does patient have vision issues? No   Does your patient have an abnormal blood pressure today? No   Since previous encounter, have you referred patient for abnormal blood pressure that resulted in a new diagnosis or medication change? No   Does your patient have an abnormal blood glucose today? No   Since previous encounter, have you referred patient for abnormal blood glucose that resulted in a new diagnosis or medication change? No   Was there a life-saving intervention made? No     Client in today  States she and son had to go to emergency room not feeling  Bad. States she was dehydrated  And was  given fluids  And  Zofran for N/V. Son was given child's  Ibuprofen. Nurse will help with medications since we are still working [place]. States she went to  [facility] today  And talked with her case Administrator, artsmanager's [place] get [place] and [place]. This has been very frustrating as we together have talked with both counties trying to work this out for [employer].  Client has also been [place] other [place] and several calls where made to get medications .[facility]  [facility] . Referral sent . Supervisor  At  [facility] [place] [facility] de[facility]  Returned call to gather information regarding up coming on 11-01-16. Client was given the opportunity to talk with  case management and start her appointment journey.Her pregnancy case manager is BeechwoodMonica  [phone] cell [phone]. They will work with  Client to support her decision to give the baby up  For  Adoption and assure that is what she really wants to do.  Also talked about [facility] at [facility] application process ,will check on this. . Client also reports she worked at a [employer] company and was diagnosed with COPD??  Nurse checked with  [facility]  To see if Prozac was okay to take during pregnancy ,ok . Nurse referred to SW for counseling appointment kept..Follow weekly

## 2016-11-03 ENCOUNTER — Telehealth: Payer: Self-pay

## 2016-11-03 NOTE — Telephone Encounter (Signed)
TC from case manager at  [facility]  that is  assigned to client as she is pregnant and had her first appointment 11-01-16. Client did keep appointment and will return for her first complete visit on 11-04-16. [facility] but  Nurse had called to assure client kept  Appointment and was called  By case manager confirming that her appointment was kept. Will work to get her placed at  [facility].

## 2016-11-29 ENCOUNTER — Telehealth: Payer: Self-pay

## 2016-11-29 NOTE — Telephone Encounter (Signed)
TC to case Production designer, theatre/television/filmmanager .Clients  Next appointment   Is  12-02-16. Client being seen and referrals to appropriate agencies  f on going or care. Nurse has  Mail  For  Client and was given a number to contact  Client  ,unable to  Reach  Client .

## 2016-12-24 ENCOUNTER — Encounter (HOSPITAL_COMMUNITY): Payer: Self-pay | Admitting: Emergency Medicine

## 2016-12-24 ENCOUNTER — Emergency Department (HOSPITAL_COMMUNITY): Payer: Medicaid Other

## 2016-12-24 ENCOUNTER — Inpatient Hospital Stay (HOSPITAL_COMMUNITY)
Admission: EM | Admit: 2016-12-24 | Discharge: 2016-12-25 | Disposition: A | Discharge status: Home / Self Care | Payer: Medicaid Other | Attending: Obstetrics and Gynecology | Admitting: Obstetrics and Gynecology

## 2016-12-24 DIAGNOSIS — O26899 Other specified pregnancy related conditions, unspecified trimester: Secondary | ICD-10-CM

## 2016-12-24 DIAGNOSIS — R109 Unspecified abdominal pain: Secondary | ICD-10-CM

## 2016-12-24 DIAGNOSIS — Z79899 Other long term (current) drug therapy: Secondary | ICD-10-CM | POA: Insufficient documentation

## 2016-12-24 DIAGNOSIS — N94818 Other vulvodynia: Secondary | ICD-10-CM

## 2016-12-24 DIAGNOSIS — Z3A28 28 weeks gestation of pregnancy: Secondary | ICD-10-CM | POA: Insufficient documentation

## 2016-12-24 LAB — COMPREHENSIVE METABOLIC PANEL
ALT: 14 U/L (ref 14–54)
ANION GAP: 9 (ref 5–15)
AST: 17 U/L (ref 15–41)
Albumin: 2.6 g/dL — ABNORMAL LOW (ref 3.5–5.0)
Alkaline Phosphatase: 105 U/L (ref 38–126)
BUN: 8 mg/dL (ref 6–20)
CALCIUM: 9 mg/dL (ref 8.9–10.3)
CO2: 21 mmol/L — ABNORMAL LOW (ref 22–32)
Chloride: 105 mmol/L (ref 101–111)
Creatinine, Ser: 0.53 mg/dL (ref 0.44–1.00)
GFR calc Af Amer: >60 mL/min (ref >60)
Glucose, Bld: 120 mg/dL — ABNORMAL HIGH (ref 65–99)
POTASSIUM: 3.7 mmol/L (ref 3.5–5.1)
Sodium: 135 mmol/L (ref 135–145)
Total Bilirubin: 0.2 mg/dL — ABNORMAL LOW (ref 0.3–1.2)
Total Protein: 6.4 g/dL — ABNORMAL LOW (ref 6.5–8.1)

## 2016-12-24 LAB — RAPID URINE DRUG SCREEN, HOSP PERFORMED
Amphetamines: NOT DETECTED
BENZODIAZEPINES: NOT DETECTED
Barbiturates: NOT DETECTED
COCAINE: NOT DETECTED
Opiates: NOT DETECTED
Tetrahydrocannabinol: NOT DETECTED

## 2016-12-24 LAB — URINALYSIS, ROUTINE W REFLEX MICROSCOPIC
Bilirubin Urine: NEGATIVE
GLUCOSE, UA: 50 mg/dL — AB
HGB URINE DIPSTICK: NEGATIVE
Ketones, ur: 5 mg/dL — AB
Leukocytes, UA: NEGATIVE
Nitrite: NEGATIVE
Protein, ur: NEGATIVE mg/dL
SPECIFIC GRAVITY, URINE: 1.025 (ref 1.005–1.030)
pH: 6 (ref 5.0–8.0)

## 2016-12-24 LAB — CBC
HEMATOCRIT: 29.7 % — AB (ref 36.0–46.0)
Hemoglobin: 9.3 g/dL — ABNORMAL LOW (ref 12.0–15.0)
MCH: 25.5 pg — ABNORMAL LOW (ref 26.0–34.0)
MCHC: 31.3 g/dL (ref 30.0–36.0)
MCV: 81.6 fL (ref 78.0–100.0)
Platelets: 308 10*3/uL (ref 150–400)
RBC: 3.64 MIL/uL — ABNORMAL LOW (ref 3.87–5.11)
RDW: 14.8 % (ref 11.5–15.5)
WBC: 8 10*3/uL (ref 4.0–10.5)

## 2016-12-24 LAB — ABO/RH: ABO/RH(D): A POS

## 2016-12-24 NOTE — ED Notes (Signed)
Patient arrives via front door. Currently "7 or 8 months pregnant" per patient. Has had "some pre-natal care". Complaining low pelvic/vaginal pain and describes it as "ripping and tearing". MD Irick at bedside and performed vaginal exam. Noted cervix to be high and closed. No evidence of rupture of membranes or vaginal bleeding.

## 2016-12-24 NOTE — Progress Notes (Signed)
Dr Emelda Fear updated on patient. Dr Emelda Fear spoke with ER provider and patient to be transferred to MAU for further evaluation.

## 2016-12-24 NOTE — Progress Notes (Signed)
Olegario Messier, RN, MAU updated on patient and pending transfer

## 2016-12-24 NOTE — Progress Notes (Signed)
Called to see patient [redacted]weeks gestation, G4 P1,  with abd pain. No reports of vag bleeding or leaking of fluid. Vag exam: 1, thick, -3, vertetx presentation.  Bedside US being preformed on arrival. Pt c/o low abd pain and pressure. UDS and U/A obtained per I&O cath. EFM applied and assessing. Pt reports sudden pain starating about 6pm. C/O shooting pain down leg and into perineum for last few days. Now suddenly worse and constant in nature.

## 2016-12-24 NOTE — ED Notes (Signed)
Called OB

## 2016-12-24 NOTE — Progress Notes (Signed)
Updated Dr. Genevie Ann on patient status. Given results of  U/A. Notified that ER provider ordering Limited OB US to be done. If u/s clear, pt ok to be OB cleared.

## 2016-12-24 NOTE — ED Notes (Signed)
Patient G4P1

## 2016-12-25 ENCOUNTER — Encounter (HOSPITAL_COMMUNITY): Payer: Self-pay

## 2016-12-25 LAB — WET PREP, GENITAL
CLUE CELLS WET PREP: NONE SEEN
SPERM: NONE SEEN
TRICH WET PREP: NONE SEEN
Yeast Wet Prep HPF POC: NONE SEEN

## 2016-12-25 MED ORDER — FLUCONAZOLE 150 MG PO TABS: 150.0000 mg | ORAL_TABLET | Freq: Once | ORAL | 1 refills | Status: AC

## 2016-12-25 MED ORDER — FLUCONAZOLE 150 MG PO TABS: 150.0000 mg | ORAL_TABLET | Freq: Once | ORAL | Status: DC

## 2016-12-25 NOTE — Progress Notes (Signed)
Pt resting quietly, eyes closed.  Awaiting carelink for transport. EFM in place, contnued assessment.t

## 2016-12-25 NOTE — MAU Note (Signed)
Patient transferred from [facility] ED with complaints of vaginal pressure and lower back pain 7/10.   States she feels like "baby's head is in between my legs."  Episode began Thursday around lunchtime and pain is constant.  Describes as a ripping sensation.  No vb or d/c.  Reports good FM.  No hx of preterm labor.

## 2016-12-25 NOTE — ED Provider Notes (Signed)
[facility]EM[facility]ENCY DEPT Provider Note   CSN: [id number] Arrival date & time: 12/24/16  2137     History   Chief Complaint Chief Complaint  Patient presents with  . Contractions  . Vaginal Pain    HPI Phyllis Phillips is a 26 y.o. female.  HPI 26 year old G40P1 female, approximately [redacted] weeks pregnant, who presents with severe lower abdominal pain. Pain began yesterday. Was initially intermittent but not occurring at regular intervals. Has now been constant for the last hour. Described as a sharp, stabbing sensation over the suprapubic area. Patient endorses dysuria but denies frequency. Denies vaginal discharge or gush of fluid. No vaginal bleeding. She has been feeling fetal movement. She [facility]. Denies fevers or chills. Reports 2 prior spontaneous abortions and one live birth via vaginal delivery. Denies nausea, vomiting, chest pain, shortness of breath, diarrhea, or constipation. No aggravating or alleviating factors for her pain. Endorses a pressure sensation in her perineum.  Past Medical History:  Diagnosis Date  . Anxiety   . Asthma   . Depression   . Shingles     There are no active problems to display for this patient.   Past Surgical History:  Procedure Laterality Date  . HAND TENDON SURGERY    . WISDOM TOOTH EXTRACTION      OB History    Gravida Para Term Preterm AB Living   SAB TAB Ectopic Multiple Live Births   Home Medications    Prior to Admission medications   Medication Sig Start Date End Date Taking? Authorizing Provider  acetaminophen (TYLENOL) 500 MG tablet Take 500-1,000 mg by mouth every 6 (six) hours as needed for mild pain, moderate pain or headache.     Historical Provider, MD  albuterol (PROVENTIL HFA;VENTOLIN HFA) 108 (90 Base) MCG/ACT inhaler Inhale 2 puffs into the lungs every 6 (six) hours as needed for wheezing or shortness of breath.    Historical  Provider, MD  FLUoxetine (PROZAC) 20 MG capsule Take 40 mg by mouth daily.     Historical Provider, MD  ondansetron (ZOFRAN ODT) 4 MG disintegrating tablet Take 1 tablet (4 mg total) by mouth every 8 (eight) hours as needed for nausea or vomiting. 10/11/16   Derwood Kaplan, MD    Family History History reviewed. No pertinent family history.  Social History Social History  Substance Use Topics  . Smoking status: Never Smoker  . Smokeless tobacco: Never Used  . Alcohol use Yes     Comment: social     Allergies   Patient has no known allergies.   Review of Systems Review of Systems  Constitutional: Negative for chills and fever.  HENT: Negative for congestion.   Eyes: Negative for visual disturbance.  Respiratory: Negative for cough and shortness of breath.   Cardiovascular: Negative for chest pain.  Gastrointestinal: Positive for abdominal pain. Negative for blood in stool, constipation, diarrhea, nausea and vomiting.  Genitourinary: Positive for dysuria and pelvic pain. Negative for flank pain, frequency, genital sores, hematuria, vaginal bleeding and vaginal discharge.  Musculoskeletal: Negative for arthralgias, back pain and myalgias.  Skin: Negative for rash.  Neurological: Negative for dizziness, light-headedness and headaches.  Psychiatric/Behavioral: Negative for agitation, behavioral problems and confusion.     Physical Exam Updated Vital Signs BP 104/70   Pulse 85   Temp 98.1 F (36.7 C) (  Oral)   Ht  (1.676 m)   Wt 104.3 kg   LMP 06/08/2016 (Within Weeks)   SpO2 99%   BMI 37.12 kg/m   Physical Exam  Constitutional: She is oriented to person, place, and time. She appears well-developed and well-nourished.  HENT:  Head: Normocephalic and atraumatic.  Eyes: Conjunctivae are normal.  Neck: Normal range of motion. Neck supple.  Cardiovascular: Normal rate, regular rhythm, normal heart sounds and intact distal pulses.   No murmur heard. Pulmonary/Chest:  Effort normal and breath sounds normal. No respiratory distress. She has no wheezes.  Abdominal: There is no tenderness.  Gravid, abdominal/pelvic pain not reproduced with palpation  Genitourinary:  Genitourinary Comments: External vaginal exam normal. Cervix posterior, closed, thick, and high.   Musculoskeletal: She exhibits no edema.  Neurological: She is alert and oriented to person, place, and time. She exhibits normal muscle tone.  Skin: Skin is warm and dry.  Psychiatric: She has a normal mood and affect.  Nursing note and vitals reviewed.    ED Treatments / Results  Labs (all labs ordered are listed, but only abnormal results are displayed) Labs Reviewed  URINALYSIS, ROUTINE W REFLEX MICROSCOPIC - Abnormal; Notable for the following:       Result Value   Glucose, UA 50 (*)    Ketones, ur 5 (*)    All other components within normal limits  CBC - Abnormal; Notable for the following:    RBC 3.64 (*)    Hemoglobin 9.3 (*)    HCT 29.7 (*)    MCH 25.5 (*)    All other components within normal limits  COMPREHENSIVE METABOLIC PANEL - Abnormal; Notable for the following:    CO2 21 (*)    Glucose, Bld 120 (*)    Total Protein 6.4 (*)    Albumin 2.6 (*)    Total Bilirubin 0.2 (*)    All other components within normal limits  RAPID URINE DRUG SCREEN, HOSP PERFORMED  ABO/RH    EKG  EKG Interpretation None       Radiology US Ob Limited  Result Date: 12/24/2016 CLINICAL DATA:  Abdominal pain EXAM: LIMITED OBSTETRIC ULTRASOUND FINDINGS: Number of Fetuses: 1 Heart Rate:  121 bpm Movement: Yes Presentation: Cephalic Placental Location: Posterior Previa: No Amniotic Fluid (Subjective):  Within normal limits. BPD:  7.17cm 28w  5d MATERNAL FINDINGS: Cervix:  Appears closed. Uterus/Adnexae:  No abnormality visualized. IMPRESSION: 28 week 5 day single live intrauterine gestation. No evidence of placental abruption or previa. Cephalic presentation of the gestation is noted. This exam  is performed on an emergent basis and does not comprehensively evaluate fetal size, dating, or anatomy; follow-up complete OB US should be considered if further fetal assessment is warranted. Electronically Signed   By: Tollie Eth M.D.   On: 12/24/2016 23:25    Procedures Procedures (including critical care time)  Medications Ordered in ED Medications - No data to display   Initial Impression / Assessment and Plan / ED Course  I have reviewed the triage vital signs and the nursing notes.  Pertinent labs & imaging results that were available during my care of the patient were reviewed by me and considered in my medical decision making (see chart for details).     Patient appears to be in pain. Reports pressure and a sharp pain over her perineum and lower pelvis. Normotensive. Sterile cervical check performed, and cervix is closed, thick, and high. Fetus is head down. Patient hooked up to fetal  monitoring. Fetal heart rate between 140 and 160. No contractions seen on tocometer.  Patient's abdominal and pelvic pain is not reproduced with palpation. She denies nausea or vomiting, has no leukocytosis, and I have a low suspicion for acute cholecystitis or appendicitis. She endorses burning with urination, but UA is not consistent with UTI. She denies vaginal discharge, and there is no cervical motion tenderness on exam to suggest PID or sexually transmitted illness.  Limited OB ultrasound performed that shows a 28 week 5 day live intrauterine gestation with no evidence of placental abruption or previa.  Given severe persistent pain and lack of OB follow-up, will transfer the patient to [facility] for further management.  Care of patient overseen by my attending, Dr. Rosalia Hammers.  Final Clinical Impressions(s) / ED Diagnoses   Final diagnoses:  Abdominal pain in pregnancy, antepartum    New Prescriptions New Prescriptions   No medications on file     Charlie Pitter, MD 12/25/16  4098    Margarita Grizzle, MD 12/28/16 1191

## 2016-12-25 NOTE — Discharge Instructions (Signed)

## 2016-12-25 NOTE — MAU Provider Note (Signed)
History     CSN: [id number]  Arrival date and time: 12/24/16 2137   First Provider Initiated Contact with Patient 12/25/16 0158      Chief Complaint  Patient presents with  . Contractions  . Vaginal Pain   HPI Ms. Phyllis Phillips is a 26 y.o. [id number] at [redacted]w[redacted]d who presents to MAU today after transfer from [facility] ED, with complaint of vulvar pressure sx x 2 days , like the baby is coming out. Thorough workup at [facility] ED, including u/s showing closed cervix and high presenting part and long cervix. The pain is at the introitus. Prenatal care at [facility] has been uneventful. Pt had one term delivery.  No preterm issues in past. No bleeeding or srom, no increase in d.c Pain is specifically at introitus, not increased or decreased by bowel function, last BM was yesterday  Stool has been harder during pregnancy.  OB History    Gravida Para Term Preterm AB Living   SAB TAB Ectopic Multiple Live Births   Past Medical History:  Diagnosis Date  . Anxiety   . Asthma   . Depression   . Shingles     Past Surgical History:  Procedure Laterality Date  . HAND TENDON SURGERY    . WISDOM TOOTH EXTRACTION      History reviewed. No pertinent family history.  Social History  Substance Use Topics  . Smoking status: Never Smoker  . Smokeless tobacco: Never Used  . Alcohol use Yes     Comment: social    Allergies: No Known Allergies  Prescriptions Prior to Admission  Medication Sig Dispense Refill Last Dose  . acetaminophen (TYLENOL) 500 MG tablet Take 500-1,000 mg by mouth every 6 (six) hours as needed for mild pain, moderate pain or headache.    Past Month at Unknown time  . albuterol (PROVENTIL HFA;VENTOLIN HFA) 108 (90 Base) MCG/ACT inhaler Inhale 2 puffs into the lungs every 6 (six) hours as needed for wheezing or shortness of breath.   Past Month at Unknown time  . FLUoxetine (PROZAC) 20 MG capsule Take 40 mg by mouth daily.    Past Month at Unknown  time  . ondansetron (ZOFRAN ODT) 4 MG disintegrating tablet Take 1 tablet (4 mg total) by mouth every 8 (eight) hours as needed for nausea or vomiting. 20 tablet 0     Review of Systems Physical Exam   Blood pressure 109/64, pulse 85, temperature 98 F (36.7 C), temperature source Oral, resp. rate 20, height  (1.676 m), weight 226 lb (102.5 kg), last menstrual period 06/08/2016, SpO2 100 %.  Physical Exam  Constitutional: She is oriented to person, place, and time. She appears well-nourished.  Mild discomfort  HENT:  Head: Normocephalic.  Eyes: Pupils are equal, round, and reactive to light.  Respiratory: Effort normal.  GI: Soft.  Genitourinary: Vagina normal.  Genitourinary Comments: Introitus: no cysts, local swelling . D/c is white generous, wet prep and koh collected.  Musculoskeletal: Normal range of motion.  Neurological: She is alert and oriented to person, place, and time.  Skin: Skin is warm and dry.  Psychiatric: She has a normal mood and affect. Her behavior is normal. Thought content normal.    Results for orders placed or performed during the hospital encounter of 12/24/16 (from the past 24 hour(s))  CBC     Status: Abnormal  Collection Time: 12/24/16  9:45 PM  Result Value Ref Range   WBC 8.0 4.0 - 10.5 K/uL   RBC 3.64 (L) 3.87 - 5.11 MIL/uL   Hemoglobin 9.3 (L) 12.0 - 15.0 g/dL   HCT 96.0 (L) 45.4 - 09.8 %   MCV 81.6 78.0 - 100.0 fL   MCH 25.5 (L) 26.0 - 34.0 pg   MCHC 31.3 30.0 - 36.0 g/dL   RDW 11.9 14.7 - 82.9 %   Platelets 308 150 - 400 K/uL  Comprehensive metabolic panel     Status: Abnormal   Collection Time: 12/24/16  9:45 PM  Result Value Ref Range   Sodium 135 135 - 145 mmol/L   Potassium 3.7 3.5 - 5.1 mmol/L   Chloride 105 101 - 111 mmol/L   CO2 21 (L) 22 - 32 mmol/L   Glucose, Bld 120 (H) 65 - 99 mg/dL   BUN 8 6 - 20 mg/dL   Creatinine, Ser 5.62 0.44 - 1.00 mg/dL   Calcium 9.0 8.9 - 13.0 mg/dL   Total Protein 6.4 (L) 6.5 - 8.1 g/dL    Albumin 2.6 (L) 3.5 - 5.0 g/dL   AST 17 15 - 41 U/L   ALT 14 14 - 54 U/L   Alkaline Phosphatase 105 38 - 126 U/L   Total Bilirubin 0.2 (L) 0.3 - 1.2 mg/dL   GFR calc non Af Amer >60 >60 mL/min   GFR calc Af Amer >60 >60 mL/min   Anion gap 9 5 - 15  ABO/Rh     Status: None   Collection Time: 12/24/16  9:45 PM  Result Value Ref Range   ABO/RH(D) A POS    No rh immune globuloin NOT A RH IMMUNE GLOBULIN CANDIDATE, PT RH POSITIVE   Urine rapid drug screen (hosp performed)     Status: None   Collection Time: 12/24/16  9:56 PM  Result Value Ref Range   Opiates NONE DETECTED NONE DETECTED   Cocaine NONE DETECTED NONE DETECTED   Benzodiazepines NONE DETECTED NONE DETECTED   Amphetamines NONE DETECTED NONE DETECTED   Tetrahydrocannabinol NONE DETECTED NONE DETECTED   Barbiturates NONE DETECTED NONE DETECTED  Urinalysis, Routine w reflex microscopic     Status: Abnormal   Collection Time: 12/24/16  9:56 PM  Result Value Ref Range   Color, Urine YELLOW YELLOW   APPearance CLEAR CLEAR   Specific Gravity, Urine 1.025 1.005 - 1.030   pH 6.0 5.0 - 8.0   Glucose, UA 50 (A) NEGATIVE mg/dL   Hgb urine dipstick NEGATIVE NEGATIVE   Bilirubin Urine NEGATIVE NEGATIVE   Ketones, ur 5 (A) NEGATIVE mg/dL   Protein, ur NEGATIVE NEGATIVE mg/dL   Nitrite NEGATIVE NEGATIVE   Leukocytes, UA NEGATIVE NEGATIVE    MAU Course  Procedures None   MDM Reviewed labs and [place] from [facility] Fetal monitoring strips reviewed.  Assessment and Plan  A:Essentially normal exam  P:Will tx empirically for yeast    Short interval f/u at [facility]

## 2017-03-06 ENCOUNTER — Inpatient Hospital Stay (HOSPITAL_COMMUNITY)
Admission: AD | Admit: 2017-03-06 | Discharge: 2017-03-06 | Disposition: A | Discharge status: Home / Self Care | Payer: Medicaid Other | Source: Ambulatory Visit | Attending: Obstetrics & Gynecology | Admitting: Obstetrics & Gynecology

## 2017-03-06 ENCOUNTER — Encounter (HOSPITAL_COMMUNITY): Payer: Self-pay

## 2017-03-06 DIAGNOSIS — R109 Unspecified abdominal pain: Secondary | ICD-10-CM | POA: Insufficient documentation

## 2017-03-06 DIAGNOSIS — R300 Dysuria: Secondary | ICD-10-CM | POA: Diagnosis not present

## 2017-03-06 DIAGNOSIS — O9989 Other specified diseases and conditions complicating pregnancy, childbirth and the puerperium: Secondary | ICD-10-CM | POA: Insufficient documentation

## 2017-03-06 DIAGNOSIS — O26893 Other specified pregnancy related conditions, third trimester: Secondary | ICD-10-CM | POA: Diagnosis not present

## 2017-03-06 DIAGNOSIS — R197 Diarrhea, unspecified: Secondary | ICD-10-CM | POA: Diagnosis present

## 2017-03-06 DIAGNOSIS — Z3A38 38 weeks gestation of pregnancy: Secondary | ICD-10-CM | POA: Insufficient documentation

## 2017-03-06 DIAGNOSIS — O99891 Other specified diseases and conditions complicating pregnancy: Secondary | ICD-10-CM

## 2017-03-06 DIAGNOSIS — M549 Dorsalgia, unspecified: Secondary | ICD-10-CM | POA: Insufficient documentation

## 2017-03-06 HISTORY — DX: Acute upper respiratory infection, unspecified: J06.9

## 2017-03-06 LAB — URINALYSIS, ROUTINE W REFLEX MICROSCOPIC
Bilirubin Urine: NEGATIVE
Glucose, UA: NEGATIVE mg/dL
Hgb urine dipstick: NEGATIVE
KETONES UR: NEGATIVE mg/dL
LEUKOCYTES UA: NEGATIVE
Nitrite: NEGATIVE
PH: 6 (ref 5.0–8.0)
Protein, ur: NEGATIVE mg/dL
SPECIFIC GRAVITY, URINE: 1.015 (ref 1.005–1.030)

## 2017-03-06 NOTE — Discharge Instructions (Signed)

## 2017-03-06 NOTE — MAU Note (Signed)
Patient presents with diarrhea x 5 days, dysuria started last night around 6:00 pm, abdominal pain and low back pain.

## 2017-03-06 NOTE — MAU Provider Note (Signed)
History   G4P1021 @ 38.5 wks in with diarrhea x 5 days, unsure of how many times per day she goes. Also dysuria since last night. Denies ROM or vag bleeding.  CSN: [id number]  Arrival date & time 03/06/17  0920   None     Chief Complaint  Patient presents with  . Abdominal Pain  . Back Pain  . Dysuria  . Diarrhea    HPI  Past Medical History:  Diagnosis Date  . Anxiety   . Asthma   . Depression   . Recurrent upper respiratory infection (URI)   . Shingles     Past Surgical History:  Procedure Laterality Date  . HAND TENDON SURGERY    . WISDOM TOOTH EXTRACTION      History reviewed. No pertinent family history.  Social History  Substance Use Topics  . Smoking status: Never Smoker  . Smokeless tobacco: Never Used  . Alcohol use Yes     Comment: social    OB History    Gravida Para Term Preterm AB Living   4 1 1   2 1    SAB TAB Ectopic Multiple Live Births   1 1     1       Review of Systems  Constitutional: Negative.   HENT: Negative.   Eyes: Negative.   Respiratory: Negative.   Cardiovascular: Negative.   Gastrointestinal: Positive for diarrhea.  Endocrine: Negative.   Genitourinary: Positive for dysuria.  Musculoskeletal: Positive for back pain.  Skin: Negative.   Allergic/Immunologic: Negative.   Neurological: Negative.   Hematological: Negative.   Psychiatric/Behavioral: Negative.     Allergies  Latex  Home Medications    BP 121/74   Pulse (!) 101   Temp 97.4 F (36.3 C) (Oral)   Resp 18   Ht 5\' 6"  (1.676 m)   Wt 236 lb (107 kg)   LMP 06/08/2016 (Within Weeks)   BMI 38.09 kg/m   Physical Exam  Constitutional: She is oriented to person, place, and time. She appears well-developed and well-nourished.  HENT:  Head: Normocephalic.  Eyes: Pupils are equal, round, and reactive to light.  Neck: Normal range of motion.  Cardiovascular: Normal rate, regular rhythm, normal heart sounds and intact distal pulses.   Pulmonary/Chest:  Effort normal and breath sounds normal.  Abdominal: Soft. Bowel sounds are normal.  Genitourinary: Vagina normal and uterus normal.  Musculoskeletal: Normal range of motion.  Neurological: She is alert and oriented to person, place, and time. She has normal reflexes.  Skin: Skin is warm and dry.  Psychiatric: She has a normal mood and affect. Her behavior is normal. Judgment and thought content normal.    MAU Course  Procedures (including critical care time)  Labs Reviewed  URINALYSIS, ROUTINE W REFLEX MICROSCOPIC - Abnormal; Notable for the following:       Result Value   APPearance HAZY (*)    All other components within normal limits  CULTURE, OB URINE   No results found.   1. Back pain affecting pregnancy in third trimester   2. Dysuria       MDM  [id number] @ 38.5 wks in with diarrhea x 5 days, unsure of how many times per day she goes. Also dysuria since last night. Denies ROM or vag bleeding. VSS u/a WNL. FHR pattern reassuring, no contractions. Will d/c home

## 2017-03-07 LAB — CULTURE, OB URINE: Culture: <10000 — AB

## 2017-03-18 ENCOUNTER — Other Ambulatory Visit (HOSPITAL_COMMUNITY): Payer: Self-pay | Admitting: Nurse Practitioner

## 2017-03-18 DIAGNOSIS — O480 Post-term pregnancy: Secondary | ICD-10-CM

## 2017-03-21 ENCOUNTER — Inpatient Hospital Stay (HOSPITAL_COMMUNITY): Payer: Medicaid Other | Admitting: Anesthesiology

## 2017-03-21 ENCOUNTER — Inpatient Hospital Stay (HOSPITAL_COMMUNITY)
Admission: AD | Admit: 2017-03-21 | Discharge: 2017-03-24 | DRG: 775 | Disposition: A | Discharge status: Home / Self Care | Payer: Medicaid Other | Source: Ambulatory Visit | Attending: Obstetrics and Gynecology | Admitting: Obstetrics and Gynecology

## 2017-03-21 ENCOUNTER — Encounter (HOSPITAL_COMMUNITY): Payer: Self-pay | Admitting: Obstetrics and Gynecology

## 2017-03-21 ENCOUNTER — Ambulatory Visit (HOSPITAL_COMMUNITY)
Admission: RE | Admit: 2017-03-21 | Discharge: 2017-03-21 | Disposition: A | Discharge status: Home / Self Care | Payer: Medicaid Other | Source: Ambulatory Visit | Attending: Nurse Practitioner | Admitting: Nurse Practitioner

## 2017-03-21 DIAGNOSIS — Z3A41 41 weeks gestation of pregnancy: Secondary | ICD-10-CM | POA: Diagnosis not present

## 2017-03-21 DIAGNOSIS — O9952 Diseases of the respiratory system complicating childbirth: Secondary | ICD-10-CM | POA: Diagnosis present

## 2017-03-21 DIAGNOSIS — Z3A40 40 weeks gestation of pregnancy: Secondary | ICD-10-CM | POA: Insufficient documentation

## 2017-03-21 DIAGNOSIS — Z6838 Body mass index (BMI) 38.0-38.9, adult: Secondary | ICD-10-CM

## 2017-03-21 DIAGNOSIS — O99824 Streptococcus B carrier state complicating childbirth: Secondary | ICD-10-CM | POA: Diagnosis present

## 2017-03-21 DIAGNOSIS — J45909 Unspecified asthma, uncomplicated: Secondary | ICD-10-CM | POA: Diagnosis present

## 2017-03-21 DIAGNOSIS — Z3689 Encounter for other specified antenatal screening: Secondary | ICD-10-CM

## 2017-03-21 DIAGNOSIS — O368130 Decreased fetal movements, third trimester, not applicable or unspecified: Secondary | ICD-10-CM | POA: Diagnosis present

## 2017-03-21 DIAGNOSIS — O99214 Obesity complicating childbirth: Secondary | ICD-10-CM | POA: Diagnosis present

## 2017-03-21 DIAGNOSIS — O480 Post-term pregnancy: Secondary | ICD-10-CM | POA: Diagnosis not present

## 2017-03-21 DIAGNOSIS — Z91040 Latex allergy status: Secondary | ICD-10-CM

## 2017-03-21 LAB — TYPE AND SCREEN
ABO/RH(D): A POS
ANTIBODY SCREEN: NEGATIVE

## 2017-03-21 LAB — CBC
HEMATOCRIT: 31 % — AB (ref 36.0–46.0)
HEMOGLOBIN: 9.4 g/dL — AB (ref 12.0–15.0)
MCH: 23.7 pg — ABNORMAL LOW (ref 26.0–34.0)
MCHC: 30.3 g/dL (ref 30.0–36.0)
MCV: 78.3 fL (ref 78.0–100.0)
Platelets: 279 10*3/uL (ref 150–400)
RBC: 3.96 MIL/uL (ref 3.87–5.11)
RDW: 17.3 % — AB (ref 11.5–15.5)
WBC: 6.6 10*3/uL (ref 4.0–10.5)

## 2017-03-21 LAB — ABO/RH: ABO/RH(D): A POS

## 2017-03-21 LAB — OB RESULTS CONSOLE GC/CHLAMYDIA
CHLAMYDIA, DNA PROBE: NEGATIVE
GC PROBE AMP, GENITAL: NEGATIVE

## 2017-03-21 LAB — OB RESULTS CONSOLE ABO/RH: RH Type: POSITIVE

## 2017-03-21 LAB — OB RESULTS CONSOLE HEPATITIS B SURFACE ANTIGEN: Hepatitis B Surface Ag: NEGATIVE

## 2017-03-21 LAB — OB RESULTS CONSOLE HIV ANTIBODY (ROUTINE TESTING): HIV: NONREACTIVE

## 2017-03-21 LAB — OB RESULTS CONSOLE RPR: RPR: NONREACTIVE

## 2017-03-21 LAB — OB RESULTS CONSOLE RUBELLA ANTIBODY, IGM: Rubella: IMMUNE

## 2017-03-21 LAB — OB RESULTS CONSOLE ANTIBODY SCREEN: Antibody Screen: NEGATIVE

## 2017-03-21 MED ORDER — PHENYLEPHRINE 40 MCG/ML (10ML) SYRINGE FOR IV PUSH (FOR BLOOD PRESSURE SUPPORT)
80.0000 ug | PREFILLED_SYRINGE | INTRAVENOUS | Status: DC | PRN
  Administered 2017-03-21: 80 ug via INTRAVENOUS
  Filled 2017-03-21: qty 5

## 2017-03-21 MED ORDER — ONDANSETRON HCL 4 MG/2ML IJ SOLN: 4.0000 mg | Freq: Four times a day (QID) | INTRAMUSCULAR | Status: DC | PRN

## 2017-03-21 MED ORDER — SOD CITRATE-CITRIC ACID 500-334 MG/5ML PO SOLN: 30.0000 mL | ORAL | Status: DC | PRN

## 2017-03-21 MED ORDER — OXYCODONE-ACETAMINOPHEN 5-325 MG PO TABS: 2.0000 | ORAL_TABLET | ORAL | Status: DC | PRN

## 2017-03-21 MED ORDER — PENICILLIN G POT IN DEXTROSE 60000 UNIT/ML IV SOLN
3.0000 10*6.[IU] | INTRAVENOUS | Status: DC
  Filled 2017-03-21 (×3): qty 50

## 2017-03-21 MED ORDER — FENTANYL 2.5 MCG/ML BUPIVACAINE 1/10 % EPIDURAL INFUSION (WH - ANES)
14.0000 mL/h | INTRAMUSCULAR | Status: DC | PRN
  Administered 2017-03-21 – 2017-03-22 (×2): 14 mL/h via EPIDURAL
  Filled 2017-03-21 (×2): qty 100

## 2017-03-21 MED ORDER — OXYTOCIN BOLUS FROM INFUSION
500.0000 mL | Freq: Once | INTRAVENOUS | Status: AC
Start: 2017-03-21 — End: 2017-03-22
  Administered 2017-03-22: 500 mL via INTRAVENOUS

## 2017-03-21 MED ORDER — OXYTOCIN 40 UNITS IN LACTATED RINGERS INFUSION - SIMPLE MED
1.0000 m[IU]/min | INTRAVENOUS | Status: DC
  Administered 2017-03-21: 2 m[IU]/min via INTRAVENOUS
  Filled 2017-03-21: qty 1000

## 2017-03-21 MED ORDER — OXYCODONE-ACETAMINOPHEN 5-325 MG PO TABS: 1.0000 | ORAL_TABLET | ORAL | Status: DC | PRN

## 2017-03-21 MED ORDER — FENTANYL CITRATE (PF) 100 MCG/2ML IJ SOLN
100.0000 ug | INTRAMUSCULAR | Status: DC | PRN
  Administered 2017-03-21 (×2): 100 ug via INTRAVENOUS
  Filled 2017-03-21 (×2): qty 2

## 2017-03-21 MED ORDER — LIDOCAINE HCL (PF) 1 % IJ SOLN
30.0000 mL | INTRAMUSCULAR | Status: DC | PRN
  Filled 2017-03-21: qty 30

## 2017-03-21 MED ORDER — TERBUTALINE SULFATE 1 MG/ML IJ SOLN
0.2500 mg | Freq: Once | INTRAMUSCULAR | Status: DC | PRN
  Filled 2017-03-21: qty 1

## 2017-03-21 MED ORDER — PHENYLEPHRINE 40 MCG/ML (10ML) SYRINGE FOR IV PUSH (FOR BLOOD PRESSURE SUPPORT)
80.0000 ug | PREFILLED_SYRINGE | INTRAVENOUS | Status: DC | PRN
  Filled 2017-03-21: qty 10
  Filled 2017-03-21: qty 5

## 2017-03-21 MED ORDER — LACTATED RINGERS IV SOLN
INTRAVENOUS | Status: DC
  Administered 2017-03-21 (×2): via INTRAVENOUS

## 2017-03-21 MED ORDER — OXYTOCIN 40 UNITS IN LACTATED RINGERS INFUSION - SIMPLE MED: 2.5000 [IU]/h | INTRAVENOUS | Status: DC

## 2017-03-21 MED ORDER — FLEET ENEMA 7-19 GM/118ML RE ENEM: 1.0000 | ENEMA | RECTAL | Status: DC | PRN

## 2017-03-21 MED ORDER — PENICILLIN G POT IN DEXTROSE 60000 UNIT/ML IV SOLN
3.0000 10*6.[IU] | INTRAVENOUS | Status: DC
  Administered 2017-03-21: 3 10*6.[IU] via INTRAVENOUS
  Filled 2017-03-21 (×5): qty 50

## 2017-03-21 MED ORDER — PENICILLIN G POTASSIUM 5000000 UNITS IJ SOLR
5.0000 10*6.[IU] | Freq: Once | INTRAVENOUS | Status: DC
  Filled 2017-03-21: qty 5

## 2017-03-21 MED ORDER — EPHEDRINE 5 MG/ML INJ
10.0000 mg | INTRAVENOUS | Status: DC | PRN
Start: 2017-03-21 — End: 2017-03-22
  Filled 2017-03-21: qty 2

## 2017-03-21 MED ORDER — LIDOCAINE HCL (PF) 1 % IJ SOLN
INTRAMUSCULAR | Status: DC | PRN
  Administered 2017-03-21 (×2): 5 mL via EPIDURAL

## 2017-03-21 MED ORDER — LACTATED RINGERS IV SOLN: 500.0000 mL | Freq: Once | INTRAVENOUS | Status: DC

## 2017-03-21 MED ORDER — PENICILLIN G POTASSIUM 5000000 UNITS IJ SOLR
5.0000 10*6.[IU] | Freq: Once | INTRAVENOUS | Status: AC
  Administered 2017-03-21: 5 10*6.[IU] via INTRAVENOUS
  Filled 2017-03-21: qty 5

## 2017-03-21 MED ORDER — DIPHENHYDRAMINE HCL 50 MG/ML IJ SOLN: 12.5000 mg | INTRAMUSCULAR | Status: DC | PRN

## 2017-03-21 MED ORDER — EPHEDRINE 5 MG/ML INJ
10.0000 mg | INTRAVENOUS | Status: DC | PRN
  Filled 2017-03-21: qty 2

## 2017-03-21 MED ORDER — LACTATED RINGERS IV SOLN: 500.0000 mL | INTRAVENOUS | Status: DC | PRN

## 2017-03-21 MED ORDER — ACETAMINOPHEN 325 MG PO TABS: 650.0000 mg | ORAL_TABLET | ORAL | Status: DC | PRN

## 2017-03-21 NOTE — Anesthesia Preprocedure Evaluation (Addendum)
Anesthesia Evaluation  Patient identified by MRN, date of birth, ID band Patient awake    Reviewed: Allergy & Precautions, NPO status , Patient's Chart, lab work & pertinent test results  History of Anesthesia Complications Negative for: history of anesthetic complications  Airway Mallampati: II  TM Distance: >3 FB Neck ROM: Full    Dental no notable dental hx. (+) Dental Advisory Given   Pulmonary asthma ,    Pulmonary exam normal        Cardiovascular negative cardio ROS Normal cardiovascular exam     Neuro/Psych PSYCHIATRIC DISORDERS Anxiety Depression negative neurological ROS     GI/Hepatic negative GI ROS, Neg liver ROS,   Endo/Other  Morbid obesity  Renal/GU negative Renal ROS  negative genitourinary   Musculoskeletal negative musculoskeletal ROS (+)   Abdominal   Peds negative pediatric ROS (+)  Hematology negative hematology ROS (+)   Anesthesia Other Findings   Reproductive/Obstetrics negative OB ROS                            Anesthesia Physical Anesthesia Plan  ASA: II  Anesthesia Plan: Epidural   Post-op Pain Management:    Induction:   PONV Risk Score and Plan:   Airway Management Planned: Natural Airway  Additional Equipment:   Intra-op Plan:   Post-operative Plan:   Informed Consent: I have reviewed the patients History and Physical, chart, labs and discussed the procedure including the risks, benefits and alternatives for the proposed anesthesia with the patient or authorized representative who has indicated his/her understanding and acceptance.   Dental advisory given  Plan Discussed with: Anesthesiologist  Anesthesia Plan Comments:        Anesthesia Quick Evaluation

## 2017-03-21 NOTE — Anesthesia Procedure Notes (Signed)
Epidural Patient location during procedure: OB Start time: 03/21/2017 7:19 PM End time: 03/21/2017 7:30 PM  Staffing Anesthesiologist: Heather RobertsSINGER, Radwan Cowley Performed: anesthesiologist   Preanesthetic Checklist Completed: patient identified, site marked, pre-op evaluation, timeout performed, IV checked, risks and benefits discussed and monitors and equipment checked  Epidural Patient position: sitting Prep: DuraPrep Patient monitoring: heart rate, cardiac monitor, continuous pulse ox and blood pressure Approach: midline Location: L2-L3 Injection technique: LOR saline  Needle:  Needle type: Tuohy  Needle gauge: 17 G Needle length: 9 cm Needle insertion depth: 8 cm Catheter size: 20 Guage Catheter at skin depth: 12 cm Test dose: negative and Other  Assessment Events: blood not aspirated, injection not painful, no injection resistance and negative IV test  Additional Notes Informed consent obtained prior to proceeding including risk of failure, 1% risk of PDPH, risk of minor discomfort and bruising.  Discussed rare but serious complications including epidural abscess, permanent nerve injury, epidural hematoma.  Discussed alternatives to epidural analgesia and patient desires to proceed.  Timeout performed pre-procedure verifying patient name, procedure, and platelet count.  Patient tolerated procedure well.

## 2017-03-21 NOTE — Progress Notes (Signed)
Labor Progress Note  Phyllis Phillips is a 26 y.o. G4P1021 at 6969w6d admitted for induction of labor due to decreased fetal movement.  S: Patient reports she is doing well and doesn't have any complaints at the moment.  She has noticed some persistent lower abdominal pressure, but states it is not very painful.  She also has started feeling contractions and thinks they are getting more frequent.  She is unsure how much time is between her contractions at this time.  She denies any back pain, presence of fluid, or vaginal bleeding.   O:  BP 122/72   Pulse 83   Temp 98.3 F (36.8 C) (Oral)   Resp 16   Ht 5\' 6"  (1.676 m)   Wt 108.9 kg (240 lb)   LMP 06/08/2016 (Within Weeks)   BMI 38.74 kg/m   No intake/output data recorded.  FHT:  FHR: 140 bpm, variability: moderate,  accelerations:  Present,  decelerations:  Absent UC:   regular, every 2 minutes    Pitocin IV infusion @ 12 mu/min (Last dose at 2:40 PM, 03/21/17)     Labs: Lab Results  Component Value Date   WBC 6.6 03/21/2017   HGB 9.4 (L) 03/21/2017   HCT 31.0 (L) 03/21/2017   MCV 78.3 03/21/2017   PLT 279 03/21/2017    Assessment / Plan: 26 y.o. [id number] 4969w6d here for decreased fetal movement. Induction of labor due to decreased fetal movement,  progressing well on pitocin   GBS Positive - Penicillin G 5 million units IV given at 12:00 PM.  3 million unit doses scheduled for 4:00 PM and 8:00 PM  Labor: Progressing on Pitocin, will continue to increase then AROM Fetal Wellbeing:  Category I Pain Control:  Epidural Anticipated MOD:  NSVD  Expectant management   Shelbie ProctorMatt Wallman, PA-Student
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
## 2017-03-21 NOTE — Anesthesia Pain Management Evaluation Note (Signed)
  CRNA Pain Management Visit Note  Patient: Phyllis Phillips, 26 y.o., female  "Hello [facility] am [facility]. We have an anesthesia team available at all times to provide care throughout the hospital, including epidural management and anesthesia for C-section. I don't know your plan for the delivery whether it a natural birth, water birth, IV sedation, nitrous supplementation, doula or epidural, but we want to meet your pain goals."   1.Was your pain managed to your expectations on prior hospitalizations?   Yes   2.What is your expectation for pain management during this hospitalization?     Epidural  3.How can we help you reach that goal?   Record the patient's initial score and the patient's pain goal.   Pain: 0  Pain Goal: 7 [facility] wants you to be able to say your pain was always managed very well.  Laban EmperorMalinova,Loranzo Desha Hristova 03/21/2017

## 2017-03-21 NOTE — H&P (Signed)
LABOR AND DELIVERY ADMISSION HISTORY AND PHYSICAL NOTE  Phyllis Phillips is a 26 y.o. female [phone] with IUP at 126w6d by LMP presenting for decreased fetal movement. Initially with non-reactive NST 2/8 (2 for fluid). Tracing improved in MAU and patient noted increase in FM. She reports no LOF, no VB, no blurry vision headaches or peripheral edema, no RUQ pain.  She plans on breast and bottle feeding. She request IUD for birth control.  Prenatal History/Complications:  Past Medical History: Past Medical History:  Diagnosis Date  . Anxiety   . Asthma   . Depression   . Recurrent upper respiratory infection (URI)   . Shingles     Past Surgical History: Past Surgical History:  Procedure Laterality Date  . HAND TENDON SURGERY    . WISDOM TOOTH EXTRACTION      Obstetrical History: OB History    Gravida Para Term Preterm AB Living   4 1 1   2 1    SAB TAB Ectopic Multiple Live Births   1 1     1       Social History: Social History   Social History  . Marital status: Single    Spouse name: N/A  . Number of children: N/A  . Years of education: N/A   Social History Main Topics  . Smoking status: Never Smoker  . Smokeless tobacco: Never Used  . Alcohol use Yes     Comment: social  . Drug use: Yes    Types: Marijuana  . Sexual activity: Yes    Birth control/ protection: Condom   Other Topics Concern  . None   Social History Narrative  . None    Family History: No family history on file.  Allergies: Allergies  Allergen Reactions  . Latex Other (See Comments)    Causes burning.    Prescriptions Prior to Admission  Medication Sig Dispense Refill Last Dose  . acetaminophen (TYLENOL) 500 MG tablet Take 500-1,000 mg by mouth every 6 (six) hours as needed for mild pain, moderate pain or headache.    Past Week at Unknown time  . albuterol (PROVENTIL HFA;VENTOLIN HFA) 108 (90 Base) MCG/ACT inhaler Inhale 2 puffs into the lungs every 6 (six) hours as needed for wheezing  or shortness of breath.   Rescue  . cyclobenzaprine (FLEXERIL) 5 MG tablet Take 5 mg by mouth 2 (two) times daily as needed for muscle spasms.   03/05/2017 at Unknown time  . famotidine (PEPCID) 20 MG tablet Take 20 mg by mouth at bedtime.   03/05/2017 at Unknown time  . ondansetron (ZOFRAN ODT) 4 MG disintegrating tablet Take 1 tablet (4 mg total) by mouth every 8 (eight) hours as needed for nausea or vomiting. 20 tablet 0 Past Month at Unknown time  . Prenatal Vit-Fe Fumarate-FA (PRENATAL MULTIVITAMIN) TABS tablet Take 1 tablet by mouth daily at 12 noon.   03/05/2017 at Unknown time     Review of Systems   All systems reviewed and negative except as stated in HPI  Physical Exam Last menstrual period 06/08/2016. General appearance: alert, cooperative and appears stated age  Presentation: cephalic confirmed on U/S Fetal monitoring: 150 bmp, mod var, accels, no decels Uterine activity: irregular q2-726m     Prenatal labs: ABO, Rh: --/--/A POS (03/30 2145) Antibody:  neg Rubella: immune RPR:   negative HBsAg:   negative HIV:   nonreactive GBS: Positive (01/02 0000)  1 hr Glucola: WNL Genetic screening:  CF nl Anatomy US: unavailable  Prenatal Transfer  Tool  Maternal Diabetes: No Genetic Screening: Normal Maternal Ultrasounds/Referrals: Normal Fetal Ultrasounds or other Referrals:  None Maternal Substance Abuse:  Yes:  Type: Marijuana - with negative UDS 5/25 Significant Maternal Medications:  None Significant Maternal Lab Results: Lab values include: Group B Strep positive  No results found for this or any previous visit (from the past 24 hour(s)).  There are no active problems to display for this patient.   Assessment: Phyllis Phillips is a 26 y.o. G4P1021 at [redacted]w[redacted]d here for decreased fetal movement, with BPP 2/8 (2 for fluid) but with reactive NST Category I in MAU so proceeding with CST  #Labor: initiate pitocin and watch FHT to see if baby tolerates  contractions #Pain: Epidural on request #FWB: BPP 2/8 (2 points for fluid) - however noted to have reactive NST Category I in MAU so will proceed with CST on labor and delivery for induction #ID:  GBS pos #MOF: breast and bottle #MOC:IUD vs nexplanon #Circ:  N/A  Howard Pouch, MD PGY-1 [name] 03/21/2017, 10:46 AM  OB FELLOW HISTORY AND PHYSICAL ATTESTATION  I have seen and examined this patient; I agree with above documentation in the resident's note.    Jen Mow, DO [place] Fellow 03/21/2017

## 2017-03-21 NOTE — Progress Notes (Signed)
S: Patient seen & examined for progress of labor. 2.5/70%/-2. Attempted foley however it came out right away with very soft cervix. Cat 1 tracing. Continue to inc pit per protocol.   Continue expectant management.  Howard PouchLauren Delania Ferg, MD PGY-1 [facility]

## 2017-03-21 NOTE — Progress Notes (Signed)
Patient ID: Phyllis Phillips, female   DOB: 11-29-1990, 26 y.o.   MRN: [id number]  S: Patient seen & examined for progress of labor. Patient comfortable with epidural.    O:  Vitals:   03/21/17 2132 03/21/17 2200 03/21/17 2202 03/21/17 2238  BP: 133/81  94/77 124/81  Pulse: 95  (!) 107 82  Resp:      Temp:  98.3 F (36.8 C)    TempSrc:  Oral    SpO2:      Weight:      Height:        Dilation: 4.5 Effacement (%): 70 Station: -2 Presentation: Vertex Exam by:: amwalker,rn   FHT: 140bpm, mod var, +accels, no decels TOCO: q2-533min  SROM prior to arrival to room, moderate meconium stained fluid  A/P: SROM - mod meconium Continue pitocin Continue expectant management Anticipate SVD

## 2017-03-21 NOTE — Progress Notes (Addendum)
G4P1 @ 40.[redacted] wksga. [facility]. External monitors applied.   Provider at bs assessing pt. Pt being admitted for PD and 2/10 BPP.  FHR 145, 15x15 accels and lots of movements noted on tracing. Reactive and reassuring FHTracing. Pt also states feeling movements since being here in triage.   1057: Report given to birthing charge nurse. Room assigned to 170.   1102: Pt to birthing by birthing RN via wheelchair.

## 2017-03-21 NOTE — Progress Notes (Addendum)
S: Patient seen & examined for progress of labor. Admitted for BPP 2/8 but now with reassuring FHT. Patient feeling some pressure with contractions now on pitocin.   Dilation: Closed Effacement (%): Thick Station: -3 Presentation: Vertex Exam by:: Dr Mosetta PuttFeng   FHT: 150 bpm, mod var, +accels, no decels TOCO: q782min   A/P: Labor:  4h after previous check, plan to attempt speculum foley and start PCN Pain: epidural on request FWB: Category I Tracing   Continue expectant management Anticipate SVD  Phyllis PouchLauren Beola Vasallo, MD PGY-1 [facility]

## 2017-03-22 ENCOUNTER — Encounter (HOSPITAL_COMMUNITY): Payer: Self-pay | Admitting: *Deleted

## 2017-03-22 DIAGNOSIS — Z3A41 41 weeks gestation of pregnancy: Secondary | ICD-10-CM

## 2017-03-22 DIAGNOSIS — O480 Post-term pregnancy: Secondary | ICD-10-CM

## 2017-03-22 DIAGNOSIS — O99824 Streptococcus B carrier state complicating childbirth: Secondary | ICD-10-CM

## 2017-03-22 LAB — RPR: RPR: NONREACTIVE

## 2017-03-22 MED ORDER — WITCH HAZEL-GLYCERIN EX PADS: 1.0000 "application " | MEDICATED_PAD | CUTANEOUS | Status: DC | PRN

## 2017-03-22 MED ORDER — ZOLPIDEM TARTRATE 5 MG PO TABS: 5.0000 mg | ORAL_TABLET | Freq: Every evening | ORAL | Status: DC | PRN

## 2017-03-22 MED ORDER — ONDANSETRON HCL 4 MG/2ML IJ SOLN: 4.0000 mg | INTRAMUSCULAR | Status: DC | PRN

## 2017-03-22 MED ORDER — DIBUCAINE 1 % RE OINT: 1.0000 "application " | TOPICAL_OINTMENT | RECTAL | Status: DC | PRN

## 2017-03-22 MED ORDER — COCONUT OIL OIL
1.0000 "application " | TOPICAL_OIL | Status: DC | PRN
  Administered 2017-03-22: 1 via TOPICAL
  Filled 2017-03-22: qty 120

## 2017-03-22 MED ORDER — ONDANSETRON HCL 4 MG PO TABS: 4.0000 mg | ORAL_TABLET | ORAL | Status: DC | PRN

## 2017-03-22 MED ORDER — BENZOCAINE-MENTHOL 20-0.5 % EX AERO
1.0000 "application " | INHALATION_SPRAY | CUTANEOUS | Status: DC | PRN
  Filled 2017-03-22: qty 56

## 2017-03-22 MED ORDER — SODIUM CHLORIDE 0.9% FLUSH: 3.0000 mL | INTRAVENOUS | Status: DC | PRN

## 2017-03-22 MED ORDER — DIPHENHYDRAMINE HCL 25 MG PO CAPS: 25.0000 mg | ORAL_CAPSULE | Freq: Four times a day (QID) | ORAL | Status: DC | PRN

## 2017-03-22 MED ORDER — SODIUM CHLORIDE 0.9% FLUSH: 3.0000 mL | Freq: Two times a day (BID) | INTRAVENOUS | Status: DC

## 2017-03-22 MED ORDER — OXYTOCIN 40 UNITS IN LACTATED RINGERS INFUSION - SIMPLE MED: 2.5000 [IU]/h | INTRAVENOUS | Status: DC | PRN

## 2017-03-22 MED ORDER — SENNOSIDES-DOCUSATE SODIUM 8.6-50 MG PO TABS
2.0000 | ORAL_TABLET | ORAL | Status: DC
  Administered 2017-03-22 – 2017-03-23 (×2): 2 via ORAL
  Filled 2017-03-22: qty 2

## 2017-03-22 MED ORDER — TETANUS-DIPHTH-ACELL PERTUSSIS 5-2.5-18.5 LF-MCG/0.5 IM SUSP: 0.5000 mL | Freq: Once | INTRAMUSCULAR | Status: DC

## 2017-03-22 MED ORDER — PRENATAL MULTIVITAMIN CH
1.0000 | ORAL_TABLET | Freq: Every day | ORAL | Status: DC
  Administered 2017-03-22 – 2017-03-24 (×3): 1 via ORAL
  Filled 2017-03-22 (×3): qty 1

## 2017-03-22 MED ORDER — SODIUM CHLORIDE 0.9 % IV SOLN: 250.0000 mL | INTRAVENOUS | Status: DC | PRN

## 2017-03-22 MED ORDER — SIMETHICONE 80 MG PO CHEW: 80.0000 mg | CHEWABLE_TABLET | ORAL | Status: DC | PRN

## 2017-03-22 MED ORDER — ACETAMINOPHEN 325 MG PO TABS: 650.0000 mg | ORAL_TABLET | ORAL | Status: DC | PRN

## 2017-03-22 MED ORDER — IBUPROFEN 600 MG PO TABS
600.0000 mg | ORAL_TABLET | Freq: Four times a day (QID) | ORAL | Status: DC
  Administered 2017-03-22 – 2017-03-24 (×10): 600 mg via ORAL
  Filled 2017-03-22 (×9): qty 1

## 2017-03-22 NOTE — Progress Notes (Signed)
Offered follow up support to Ms Corliss BlackerMcNeill.  She was emotional, but was trying very hard to hold back her tears and her emotions because her son was in the room with her.  I brought her a prayer shawl and let her know that we were here for continued emotional and spiritual support.  Chaplain Dyanne CarrelKaty Onesty Clair, Bcc Pager, [phone] 4:16 PM    03/22/17 1600  Clinical Encounter Type  Visited With Patient and family together  Visit Type Spiritual support  Referral From Chaplain

## 2017-03-22 NOTE — Lactation Note (Signed)
This note was copied from a baby'Phillips chart. Lactation Consultation Note  [name] Date: 03/22/2017 Reason for consult: Initial assessment Breastfeeding consultation services and support information given to patient.  This is [name] second baby and she breastfed her first baby for 8 months.  Baby showing early feeding cues.  Mom can hand express good amounts of colostrum into baby'Phillips mouth.  Assisted with postioning baby in football hold.  Baby not interested in latching.  Instructed mom to watch for more cues.  Mom states baby has been latching well.  Encouraged to call out for assist prn.  Maternal Data Has patient been taught Hand Expression?: Yes Does the patient have breastfeeding experience prior to this delivery?: Yes  Feeding Feeding Type: Breast Fed Length of feed: 25 min  LATCH Score/Interventions Latch: Too sleepy or reluctant, no latch achieved, no sucking elicited. [name]: Skin to skin;Teach feeding cues;Waking techniques [name]: Breast compression;Breast massage;Assist with latch;Adjust position  Audible Swallowing: None [name]: Hand expression;Alternate breast massage  Type of Nipple: Everted at rest and after stimulation  Comfort (Breast/Nipple): Soft / non-tender     Hold (Positioning): Assistance needed to correctly position infant at breast and maintain latch. [name]: Breastfeeding basics reviewed;Support Pillows;Position options;Skin to skin  LATCH Score: 5  Lactation Tools Discussed/Used     Consult Status Consult Status: Follow-up Date: 03/23/17 Follow-up type: In-patient    [name], Phyllis Phillips 03/22/2017, 12:28 PM

## 2017-03-22 NOTE — Anesthesia Postprocedure Evaluation (Signed)
Anesthesia Post Note  Patient: Phyllis Phillips  Procedure(s) Performed: * No procedures listed *     Patient location during evaluation: Mother Baby Anesthesia Type: Epidural Level of consciousness: awake and alert Pain management: pain level controlled Vital Signs Assessment: post-procedure vital signs reviewed and stable Respiratory status: spontaneous breathing, nonlabored ventilation and respiratory function stable Cardiovascular status: stable Postop Assessment: no headache, no backache and epidural receding Anesthetic complications: no    Last Vitals:  Vitals:   03/22/17 0250 03/22/17 0350  BP: 128/74 127/69  Pulse: 85 83  Resp: 20 20  Temp: 37 C 37.1 C    Last Pain:  Vitals:   03/22/17 0532  TempSrc:   PainSc: 2    Pain Goal:                 Junious SilkGILBERT,Nasario Czerniak

## 2017-03-23 MED ORDER — SENNOSIDES-DOCUSATE SODIUM 8.6-50 MG PO TABS: 2.0000 | ORAL_TABLET | Freq: Every evening | ORAL | 0 refills | Status: DC | PRN

## 2017-03-23 MED ORDER — PRENATAL MULTIVITAMIN CH: 1.0000 | ORAL_TABLET | Freq: Every day | ORAL | 0 refills | Status: DC

## 2017-03-23 MED ORDER — IBUPROFEN 600 MG PO TABS: 600.0000 mg | ORAL_TABLET | Freq: Four times a day (QID) | ORAL | 0 refills | Status: DC

## 2017-03-23 NOTE — Progress Notes (Signed)
POSTPARTUM PROGRESS NOTE  Post Partum Day #2  Subjective:  Phyllis Phillips is a 26 y.o. [id number] 8242w0d s/p SVD.  No acute events overnight.  Pt denies problems with ambulating, voiding or po intake.  She denies nausea or vomiting.  Pain is well controlled.  She has had flatus. She has not had bowel movement.  Lochia Minimal.   Objective: Blood pressure 139/76, pulse 71, temperature 97.7 F (36.5 C), temperature source Oral, resp. rate 20, height 5\' 6"  (1.676 m), weight 108.9 kg (240 lb), last menstrual period 06/08/2016, SpO2 100 %, unknown if currently breastfeeding.  Physical Exam:  General: alert, cooperative and no distress Lochia:normal flow Uterine Fundus: firm DVT Evaluation: No calf swelling or tenderness Extremities: no LE edema   Recent Labs  03/21/17 1115  HGB 9.4*  HCT 31.0*    Assessment/Plan:  ASSESSMENT: Phyllis Phillips is a 26 y.o. L2G4010G4P2021 6642w0d s/p SVD  Plan for DC home tomorrow.   LOS: 2 days   Howard PouchLauren Feng, MD PGY-1 [facility]  03/23/2017, 7:32 AM   OB FELLOW POSTPARTUM PROGRESS NOTE ATTESTATION  I confirm that I have verified the information documented in the resident's note and that I have also personally reperformed the physical exam and all medical decision making activities.     Ernestina PennaNicholas Schenk, MD 9:31 AM

## 2017-03-23 NOTE — Discharge Instructions (Signed)

## 2017-03-23 NOTE — Clinical Social Work Maternal (Signed)
CLINICAL SOCIAL WORK MATERNAL/CHILD NOTE  Patient Details  Name: Phyllis Phillips MRN: [id number] Date of Birth: [birth year]  Date:  03/23/2017  Clinical Social Worker Initiating Note:  Phyllis Phillips Date/ Time Initiated:  03/23/17/1056     Child's Name:  Phyllis Phillips   Legal Guardian:  Mother (Per MOB, FOB will not be involved)   Need for Interpreter:  None   Date of Referral:  03/22/17     Reason for Referral:  Behavioral Health Issues, including SI , Current Substance Use/Substance Use During Pregnancy  (hx of THC use and MH hx)   Referral Source:  [facility]   Address:  [address] [place] [address]  Phone number:  [phone]   Household Members:  Self, Roommate, Minor Children (MOB's oldes child is Phyllis Phillips 04/01/10)   Natural Supports (not living in the home):  Immediate Family, Friends   Professional Supports: Case Manager/Social Worker (MOB's OBCM is Nature conservation officer [phone])   Employment: Unemployed   Type of Work:     Education:      Pensions consultant:  [employer]   Other Resources:  Theatre stage manager Considerations Which May Impact Care:  None reported  Strengths:  Ability to meet basic needs , Engineer, materials , Home prepared for child , Understanding of illness   Risk Factors/Current Problems:  Substance Use , Mental Health Concerns    Cognitive State:  Alert , Able to Concentrate , Linear Thinking    Mood/Affect:  Tearful , Calm , Relaxed , Interested , Comfortable    CSW Assessment: CSW met with MOB to complete an assessment for MH hx and hx of SA.  When CSW arrived, MOB was resting in bed and infant was asleep in bassinet. MOB was soft-spoken, reserved, but receptive to meeting with CSW. MOB was also attentive to infant's needs and responded appropriately to infant's cues. MOB reported that MOB pregnancy was not planned and FOB was not going to be involved.  When CSW inquired about FOB, MOB  refused to give any detailed information and communicated that "I don't want to talk about that".  CSW asked about MOB's support and MOB reported that MOB has a best friend, roommate, and aunt that will provide support.CSW offered MOB resources for other community supports and MOB declined.  However, MOB agreed to continue to receive service with MOB's OBCM, Nature conservation officer.    CSW inquired about MOB's MH hx and MOB acknowledged a hx of anxiety and depression. MOB reported medication management until MOB's pregnancy confirmation.  MOB stated that MOB received [place] services in [place], [place], but has not received any services since relocating to [place].  CSW offered MOB resources for outpatient [place] agency and MOB declined.  MOB reported that MOB has received a wealth of resources for the [employer] and is aware of local agencies that provide [facility] services. CSW educated MOB about PPD. CSW informed MOB of possible supports and interventions to decrease PPD.  CSW also encouraged MOB to seek medical attention if needed for increased signs and symptoms for PPD. CSW also provided MOB with a PPD checklist an encouraged MOB to utilize it weekly; MOB agreed.   MOB did not present with any acute mental health symptoms, and presents with insight and self-awareness related to her mental health needs. MOB denied SI, HI, and DV.   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  MOB communicated MOB's last use was 10/2016  and reported that MOB used to assist MOB with coping with MOB's anxiety and depression. MOB recognized that smoking marijuana was not an appropriate coping mechanism and was able to communicate other natural interventions.  CSW informed MOB of the hospital's SA policy and MOB was understanding.  CSW made MOB aware that the infant's UDS was negative and CSW will continue to monitor infant's CDS.  MOB understood that CSW will make a CPS report to [facility] CPS  if infant's UDS is positive without an explanation. CSW offered MOB SA resources and MOB declined.   CSW provided SIDS education and MOB reported having all necessary items for infant.    CSW contact Health Dept. Clinical Social Worker( Kim Hertzing), and requested a consultation during MOB's 6 week follow-up. MOB also left MOB's OBCM a voicemail message and requested a call back regarding follow-up with  MOB.  There are no barriers to d/c.    CSW Plan/Description:  Information/Referral to [employer] , [employer] , No Further Intervention Required/No Barriers to Discharge (CSW will follow infant's CDS and will make a report to [place] if warranted. )    Callahan Wild D BOYD-GILYARD, LCSW 03/23/2017, 11:06 AM

## 2017-03-23 NOTE — Progress Notes (Signed)
Pediatrician keeping infant overnight. Discharge summary in notes but no discharge order written yet. Notified Phyllis PouchLauren Feng, MD, who stated that mother could stay a patient as well. No discharge orders written for today. Phyllis Phillips, Phyllis Phillips

## 2017-03-23 NOTE — Lactation Note (Addendum)
This note was copied from a baby's chart. Lactation Consultation Note  Patient Name: Girl Jeoffrey Massedracy Javid KXFGH'WToday's Date: 03/23/2017 Reason for consult: Follow-up assessment;Breast/nipple pain (no stool in 35 hrs.)  Baby 35 hrs old, and has not had a stool, >10 voids since birth.  Baby has been exclusively breastfeeding baby on cue, >8 feedings last 24 hrs. Mom with large, heavy breasts, and semi-flat nipples.  Asked to see baby latched and feeding as Mom complaining of soreness.  Mom assisted with use of pillows for support.  Demonstrated hand expression, and transitional milk expressed and sprayed onto bed. Baby latches easily, but Mom sore at initial latch.  Encouraged Mom to support her breast and use alternate breast compression during feeding to increase swallowing.  Swallows identified.  As Mom relaxed, baby swallowed more.  Mom complaining of uterine cramping.  Talked about post breastfeeding pumping and feeding baby her EBM back to baby.  Baby fed 3 ml of formula this am, but baby would not take any more.   Set up DEBP at bedside and educated Mom on pumping, cleaning and storage of milk.   To ask for help prn. Lactation to follow up 6/28.   Consult Status Consult Status: Follow-up Date: 03/24/17 Follow-up type: In-patient    Judee ClaraSmith, Mardi Cannady E 03/23/2017, 12:32 PM

## 2017-03-23 NOTE — Plan of Care (Signed)
Problem: Pain Management: Goal: General experience of comfort will improve and pain level will decrease Discussed importance of ambulation and correct posture to decrease back discomfort and soreness. Provided patient with scheduled medication and discussed additional medication as needed. Patient states that ibuprofen seems to be working well for now.

## 2017-03-23 NOTE — Discharge Summary (Signed)
OB Discharge Summary     [name] DOB: Nov 09, 1990 MRN: [id number]  Date of admission: 03/21/2017 Delivering MD: Jen Mow [facility]   Date of discharge: 03/23/2017  Admitting diagnosis: POSTDATES Intrauterine pregnancy: [redacted]w[redacted]d     Secondary diagnosis:  Active Problems:   Normal labor  Additional problems:  Patient Active Problem List   Diagnosis Date Noted  . Normal labor 03/21/2017   BPP 2/8     Discharge diagnosis: Term Pregnancy Delivered                                   Augmentation: Pitocin  Complications: None  Hospital course:  Onset of Labor With Vaginal Delivery     26 y.o. yo [id number] at [redacted]w[redacted]d was admitted on 03/21/2017. Patient had an uncomplicated labor course as follows:  Membrane Rupture Time/Date: 2:20 AM ,03/21/2017   Intrapartum Procedures: Episiotomy: None [1]                                         Lacerations:  1st degree [2];Perineal [11];Labial [10]  Patient had a delivery of a Viable infant. 03/22/2017  Information for the patient's newborn:  Elfrida, Pixley [[id number]]  Delivery Method: Vaginal, Spontaneous Delivery (Filed from Delivery Summary)    Pateint had an uncomplicated postpartum course.  She is ambulating, tolerating a regular diet, passing flatus, and urinating well. Patient is discharged home in stable condition on 03/23/17.   Physical exam  Vitals:   03/22/17 0800 03/22/17 1608 03/22/17 1818 03/23/17 0550  BP: 116/69 128/67 (!) 132/55 139/76  Pulse: 80  92 71  Resp: 20 18 20 20   Temp: 98.2 F (36.8 C)  98.6 F (37 C) 97.7 F (36.5 C)  TempSrc:   Oral Oral  SpO2: 98%  100%   Weight:      Height:       General: alert, cooperative and no distress Lochia: appropriate Uterine Fundus: firm DVT Evaluation: No evidence of DVT seen on physical exam. Labs: Lab Results  Component Value Date   WBC 6.6 03/21/2017   HGB 9.4 (L) 03/21/2017   HCT 31.0 (L) 03/21/2017   MCV 78.3 03/21/2017   PLT 279  03/21/2017   CMP Latest Ref Rng & Units 12/24/2016  Glucose 65 - 99 mg/dL 130(Q)  BUN 6 - 20 mg/dL 8  Creatinine 6.57 - 8.46 mg/dL 9.62  Sodium 952 - 841 mmol/L 135  Potassium 3.5 - 5.1 mmol/L 3.7  Chloride 101 - 111 mmol/L 105  CO2 22 - 32 mmol/L 21(L)  Calcium 8.9 - 10.3 mg/dL 9.0  Total Protein 6.5 - 8.1 g/dL 6.4(L)  Total Bilirubin 0.3 - 1.2 mg/dL 3.2(G)  Alkaline Phos 38 - 126 U/L 105  AST 15 - 41 U/L 17  ALT 14 - 54 U/L 14    Discharge instruction: per After Visit Summary and "Baby and Me Booklet".  After visit meds:  Allergies as of 03/23/2017      Reactions   Latex Other (See Comments)   Causes burning.      Medication List    STOP taking these medications   ondansetron 4 MG disintegrating tablet Commonly known as:  ZOFRAN ODT     TAKE these medications   albuterol 108 (90 Base) MCG/ACT inhaler Commonly known as:  PROVENTIL HFA;VENTOLIN  HFA Inhale 2 puffs into the lungs every 6 (six) hours as needed for wheezing or shortness of breath.   cyclobenzaprine 5 MG tablet Commonly known as:  FLEXERIL Take 5 mg by mouth 2 (two) times daily as needed for muscle spasms.   ibuprofen 600 MG tablet Commonly known as:  ADVIL,MOTRIN Take 1 tablet (600 mg total) by mouth every 6 (six) hours.   prenatal multivitamin Tabs tablet Take 1 tablet by mouth daily at 12 noon.   senna-docusate 8.6-50 MG tablet Commonly known as:  Senokot-S Take 2 tablets by mouth at bedtime as needed for mild constipation.       Diet: routine diet  Activity: Advance as tolerated. Pelvic rest for 6 weeks.   Outpatient follow up:6 weeks Follow up Appt:No future appointments. Follow up Visit:No Follow-up on file.  Postpartum contraception: IUD Mirena  Newborn Data: Live born female  Birth Weight: 9 lb 5 oz (4224 g) APGAR: 8, 9  Baby Feeding: Bottle and Breast Disposition:home with mother   03/23/2017 Howard PouchLauren Feng, MD  OB FELLOW DISCHARGE ATTESTATION  I confirm that I have  verified the information documented in the resident's note and that I have also personally reperformed the physical exam and all medical decision making activities.     Ernestina PennaNicholas Schenk, MD 9:44 AM

## 2017-03-24 NOTE — Progress Notes (Signed)
Notified Cleone Slimaroline Neill, midwife student, about Phyllis Phillips Postnatal Depression Scale score of 21. Suggested frequent follow up for patient after discharge. Social work has already seen patient; however, consult was placed again after score was entered in computer. Rayfield CitizenCaroline to discuss and plan with doctors on call. Earl Galasborne, Linda HedgesStefanie Monroe NorthHudsp[place]

## 2017-03-24 NOTE — Lactation Note (Signed)
This note was copied from a baby's chart. Lactation Consultation Note  Patient Name: Girl Jeoffrey Massedracy Piet FAOZH'YToday's Date: 03/24/2017 Reason for consult: Follow-up assessment   Mom c/o nipple soreness. Her R nipple has a compression stripe on tip & a small crack at base of nipple. Infant observed latching; she is transferring milk (especially w/breast compressions), but Mom uncomfortable during latch. I assisted with flanging of lips and lowering mandible. Mom still with some discomfort. Mom is somewhat leaning over baby to nurse. I recommended that she sit more upright & use pillows to raise infant to level of nipple with next feeding. Specifics of an asymmetric latch shown via [employer] website animation.   Mom knows that if nipple soreness becomes too much, she can pump & give her EBM via bottle. Mom has a hand pump for home & has parts from her DEBP, if she is eligible for a pump from [facility]. Shells were provided to protect nipples from friction. Mom already has coconut oil. The website www.breastfeedingrose.org was also mentioned to mom. Mom also [facility] the [facility] in her phone to call us once she's home.   Lurline HareRichey, Jazzmyne Rasnick [facility] 03/24/2017, 11:15 AM

## 2017-03-24 NOTE — Progress Notes (Signed)
CSW acknowledged second consult and met with MOB in room 122.  With MOB's permission, CSW asked MOB's room guest to leave in order to meet with MOB in private.  MOB's mood appeared to have increased from yesterday (03/23/17) as evidence by increase smiles and eye to eye contact displayed by MOB.  CSW inquired about MOB's emotions and concerns regarding MOB's score (21) on the [place] Postnatal Screen.  MOB acknowledged consistent feelings of sadness and feelings of being overwhelmed. However, MOB was not receptive to outpatient resources.  CSW assessed for safety and MOB denied SI, HI, and DV. MOB had insight and awareness about her mental health needs and agreed to seek help after d/c.  CSW left a message for MOB's OBCM Landscape architect) providing her with MOB's Edinburgh score. CSW requested a call back.   Laurey Arrow, MSW, LCSW Clinical Social Work [phone]

## 2017-03-24 NOTE — Plan of Care (Signed)
Problem: Education: Goal: Knowledge of condition will improve Discharge education, safety and follow up reviewed with patient and family. Patient verbalizes understanding of information.

## 2017-03-24 NOTE — Discharge Summary (Signed)
OB Discharge Summary     [name] DOB: May 30, 1991 MRN: [id number]  Date of admission: 03/21/2017 Delivering MD: Jen Mow [facility]   Date of discharge: 03/24/2017  Admitting diagnosis: POSTDATES Intrauterine pregnancy: [redacted]w[redacted]d     Secondary diagnosis:  Active Problems:   Normal labor  Additional problems: n/a     Discharge diagnosis: Term Pregnancy Delivered                                                                                                Post partum procedures:n/a  Augmentation: Pitocin and Foley Balloon  Complications: None  Hospital course:  Onset of Labor With Vaginal Delivery     26 y.o. yo [id number] at [redacted]w[redacted]d was admitted in Latent Labor on 03/21/2017. Patient had an uncomplicated labor course as follows:  Membrane Rupture Time/Date: 2:20 AM ,03/21/2017   Intrapartum Procedures: Episiotomy: None [1]                                         Lacerations:  1st degree [2];Perineal [11];Labial [10]  Patient had a delivery of a Viable infant. 03/22/2017  Information for the patient's newborn:  Phyllis Phillips [[id number]]  Delivery Method: Vaginal, Spontaneous Delivery (Filed from Delivery Summary)    Pateint had an uncomplicated postpartum course.  She is ambulating, tolerating a regular diet, passing flatus, and urinating well. Patient is discharged home in stable condition on 03/24/17.   Physical exam  Vitals:   03/22/17 1608 03/22/17 1818 03/23/17 0550 03/23/17 1900  BP: 128/67 (!) 132/55 139/76 126/69  Pulse:  92 71 79  Resp: 18 20 20 20   Temp:  98.6 F (37 C) 97.7 F (36.5 C) 98 F (36.7 C)  TempSrc:  Oral Oral Oral  SpO2:  100%    Weight:      Height:       General: alert, cooperative and no distress Lochia: appropriate Uterine Fundus: firm Incision: N/A DVT Evaluation: No evidence of DVT seen on physical exam. Labs: Lab Results  Component Value Date   WBC 6.6 03/21/2017   HGB 9.4 (L) 03/21/2017   HCT 31.0 (L)  03/21/2017   MCV 78.3 03/21/2017   PLT 279 03/21/2017   CMP Latest Ref Rng & Units 12/24/2016  Glucose 65 - 99 mg/dL 725(D)  BUN 6 - 20 mg/dL 8  Creatinine 6.64 - 4.03 mg/dL 4.74  Sodium 259 - 563 mmol/L 135  Potassium 3.5 - 5.1 mmol/L 3.7  Chloride 101 - 111 mmol/L 105  CO2 22 - 32 mmol/L 21(L)  Calcium 8.9 - 10.3 mg/dL 9.0  Total Protein 6.5 - 8.1 g/dL 6.4(L)  Total Bilirubin 0.3 - 1.2 mg/dL 8.7(F)  Alkaline Phos 38 - 126 U/L 105  AST 15 - 41 U/L 17  ALT 14 - 54 U/L 14    Discharge instruction: per After Visit Summary and "Baby and Me Booklet".  After visit meds:  Allergies as of 03/24/2017      Reactions   Latex  Other (See Comments)   Causes burning.      Medication List    STOP taking these medications   ondansetron 4 MG disintegrating tablet Commonly known as:  ZOFRAN ODT     TAKE these medications   albuterol 108 (90 Base) MCG/ACT inhaler Commonly known as:  PROVENTIL HFA;VENTOLIN HFA Inhale 2 puffs into the lungs every 6 (six) hours as needed for wheezing or shortness of breath.   cyclobenzaprine 5 MG tablet Commonly known as:  FLEXERIL Take 5 mg by mouth 2 (two) times daily as needed for muscle spasms.   ibuprofen 600 MG tablet Commonly known as:  ADVIL,MOTRIN Take 1 tablet (600 mg total) by mouth every 6 (six) hours.   prenatal multivitamin Tabs tablet Take 1 tablet by mouth daily at 12 noon.   senna-docusate 8.6-50 MG tablet Commonly known as:  Senokot-S Take 2 tablets by mouth at bedtime as needed for mild constipation.       Diet: routine diet  Activity: Advance as tolerated. Pelvic rest for 6 weeks.   Outpatient follow up:6 weeks Follow up Appt:No future appointments. Follow up Visit:No Follow-up on file.  Postpartum contraception: Nexplanon  Newborn Data: Live born female  Birth Weight: 9 lb 5 oz (4224 g) APGAR: 8, 9  Baby Feeding: Bottle Disposition:home with mother   03/24/2017 Cleone Slimaroline Neill, Student-MidWife   I confirm  that I have verified the information documented in the nurse midwife student's note and that I have also personally reperformed the physical exam and all medical decision making activities.   Thressa ShellerHeather Hogan  5:54 AM 03/24/17

## 2017-03-31 ENCOUNTER — Institutional Professional Consult (permissible substitution): Payer: Medicaid Other

## 2017-03-31 NOTE — BH Specialist Note (Deleted)
Integrated Behavioral Health Initial Visit  MRN: [id number] Name: Jeoffrey Massedracy Seefeldt   Session Start time: *** Session End time: *** Total time: {IBH Total Time:21014050}  Type of Service: Integrated Behavioral Health- Individual/Family Interpretor:No. Interpretor Name and Language: n/a   Warm Hand Off Completed.       SUBJECTIVE: Jeoffrey Massedracy Riggins is a 26 y.o. female accompanied by {Persons; PED relatives w/patient:19415}. Patient was referred by Dr Vergie LivingPickens for depression Patient reports the following symptoms/concerns: *** Duration of problem: ***; Severity of problem: {Mild/Moderate/Severe:20260}  OBJECTIVE: Mood: {BHH MOOD:22306} and Affect: {BHH AFFECT:22307} Risk of harm to self or others: {CHL AMB BH Suicide Current Mental Status:21022748}   LIFE CONTEXT: Family and Social: *** School/Work: *** Self-Care: *** Life Changes: ***  GOALS ADDRESSED: Patient will reduce symptoms of: {IBH Symptoms:21014056} and increase knowledge and/or ability of: {IBH Patient Tools:21014057} and also: {IBH Goals:21014053}   INTERVENTIONS: {IBH Interventions:21014054}  Standardized Assessments completed: GAD-7 and PHQ 9  ASSESSMENT: Patient currently experiencing ***. Patient may benefit from ***.  PLAN: 1. Follow up with behavioral health clinician on : *** 2. Behavioral recommendations: *** 3. Referral(s): {IBH Referrals:21014055} 4. "From scale of 1-10, how likely are you to follow plan?": ***  Jamie C McMannes, LCSWA

## 2017-04-25 ENCOUNTER — Ambulatory Visit: Payer: Medicaid Other | Admitting: Obstetrics and Gynecology

## 2017-04-25 NOTE — Progress Notes (Signed)
Chart opened in error

## 2017-10-03 ENCOUNTER — Encounter (HOSPITAL_COMMUNITY): Payer: Self-pay | Admitting: Emergency Medicine

## 2017-10-03 ENCOUNTER — Emergency Department (HOSPITAL_COMMUNITY)
Admission: EM | Admit: 2017-10-03 | Discharge: 2017-10-03 | Disposition: A | Discharge status: Home / Self Care | Payer: Medicaid Other | Attending: Emergency Medicine | Admitting: Emergency Medicine

## 2017-10-03 ENCOUNTER — Other Ambulatory Visit: Payer: Self-pay

## 2017-10-03 DIAGNOSIS — K1370 Unspecified lesions of oral mucosa: Secondary | ICD-10-CM | POA: Diagnosis present

## 2017-10-03 DIAGNOSIS — J45909 Unspecified asthma, uncomplicated: Secondary | ICD-10-CM | POA: Insufficient documentation

## 2017-10-03 DIAGNOSIS — Z91040 Latex allergy status: Secondary | ICD-10-CM | POA: Diagnosis not present

## 2017-10-03 DIAGNOSIS — Z79899 Other long term (current) drug therapy: Secondary | ICD-10-CM | POA: Diagnosis not present

## 2017-10-03 DIAGNOSIS — K121 Other forms of stomatitis: Secondary | ICD-10-CM | POA: Insufficient documentation

## 2017-10-03 NOTE — ED Provider Notes (Signed)
[facility] EMERGENCY DEPARTMENT Provider Note   CSN: [id number] Arrival date & time: 10/03/17  4098     History   Chief Complaint Chief Complaint  Patient presents with  . Mouth Lesions    HPI Phyllis Phillips is a 27 y.o. female.  The history is provided by the patient. No language interpreter was used.  Mouth Lesions  Location:  Upper lip Severity:  Mild Duration:  1 week Chronicity:  New Context: not a change in diet   Relieved by:  Nothing Worsened by:  Nothing Ineffective treatments:  None tried Associated symptoms: no congestion and no fever     Past Medical History:  Diagnosis Date  . Anxiety   . Asthma    last used a month ago  . Depression   . Recurrent upper respiratory infection (URI)   . Shingles     Patient Active Problem List   Diagnosis Date Noted  . Normal labor 03/21/2017    Past Surgical History:  Procedure Laterality Date  . HAND TENDON SURGERY    . WISDOM TOOTH EXTRACTION      OB History    Gravida Para Term Preterm AB Living   4 2 2   2 1    SAB TAB Ectopic Multiple Live Births   1 1   0 1       Home Medications    Prior to Admission medications   Medication Sig Start Date End Date Taking? Authorizing Provider  albuterol (PROVENTIL HFA;VENTOLIN HFA) 108 (90 Base) MCG/ACT inhaler Inhale 2 puffs into the lungs every 6 (six) hours as needed for wheezing or shortness of breath.    [provider]  cyclobenzaprine (FLEXERIL) 5 MG tablet Take 5 mg by mouth 2 (two) times daily as needed for muscle spasms.    [provider]  ibuprofen (ADVIL,MOTRIN) 600 MG tablet Take 1 tablet (600 mg total) by mouth every 6 (six) hours. 03/23/17   Howard Pouch, MD  Prenatal Vit-Fe Fumarate-FA (PRENATAL MULTIVITAMIN) TABS tablet Take 1 tablet by mouth daily at 12 noon. 03/23/17   Howard Pouch, MD  senna-docusate (SENOKOT-S) 8.6-50 MG tablet Take 2 tablets by mouth at bedtime as needed for mild constipation. 03/23/17    Howard Pouch, MD    Family History Family History  Problem Relation Age of Onset  . Cancer Mother   . Early death Mother   . Arthritis Father   . Cancer Father   . Diabetes Father   . Hypertension Father   . Varicose Veins Father     Social History Social History   Tobacco Use  . Smoking status: Never Smoker  . Smokeless tobacco: Never Used  Substance Use Topics  . Alcohol use: Yes    Comment: social  . Drug use: Yes    Types: Marijuana     Allergies   Latex   Review of Systems Review of Systems  Constitutional: Negative for fever.  HENT: Positive for mouth sores. Negative for congestion.   All other systems reviewed and are negative.    Physical Exam Updated Vital Signs BP 127/78 (BP Location: Right Arm)   Pulse 65   Temp 98 F (36.7 C) (Oral)   Resp 16   LMP 09/27/2017   SpO2 100%   Physical Exam  Constitutional: She appears well-developed and well-nourished.  HENT:  Head: Normocephalic.  Right Ear: External ear normal.  Left Ear: External ear normal.  Mouth/Throat: Oropharynx is clear and moist.  Eyes: Pupils are  equal, round, and reactive to light.  Neck: Normal range of motion.  Cardiovascular: Normal rate.  Pulmonary/Chest: Effort normal.  Neurological: She is alert.  Skin: Skin is warm.  Psychiatric: She has a normal mood and affect.  Nursing note and vitals reviewed.    ED Treatments / Results  Labs (all labs ordered are listed, but only abnormal results are displayed) Labs Reviewed - No data to display  EKG  EKG Interpretation None      MDM Number of Diagnoses or Management Options Oral ulcer:  Radiology No results found.  Procedures Procedures (including critical care time)  Medications Ordered in ED Medications - No data to display   Initial Impression / Assessment and Plan / ED Course  I have reviewed the triage vital signs and the nursing notes.  Pertinent labs & imaging results that were available during my  care of the patient were reviewed by me and considered in my medical decision making (see chart for details).     MDM  Pt counseled on ulcers and need for follow up.    Final Clinical Impressions(s) / ED Diagnoses   Final diagnoses:  Oral ulcer    ED Discharge Orders    None    An After Visit Summary was printed and given to the patient.    Elson AreasSofia, Lucrezia Dehne K, [place] JerseyPA-C 10/03/17 1701    Linwood DibblesKnapp, Jon, MD 10/04/17 [phone]

## 2017-10-03 NOTE — Discharge Instructions (Signed)
Return if any problems.

## 2017-10-03 NOTE — ED Triage Notes (Signed)
Pt has small ulcer on right side of bottom lip. Pt states she bit her lip 1 week ago but sore will not heal.

## 2017-12-21 ENCOUNTER — Emergency Department (HOSPITAL_COMMUNITY)
Admission: EM | Admit: 2017-12-21 | Discharge: 2017-12-21 | Disposition: A | Discharge status: Home / Self Care | Payer: Medicaid Other | Attending: Emergency Medicine | Admitting: Emergency Medicine

## 2017-12-21 ENCOUNTER — Encounter (HOSPITAL_COMMUNITY): Payer: Self-pay

## 2017-12-21 ENCOUNTER — Other Ambulatory Visit: Payer: Self-pay

## 2017-12-21 DIAGNOSIS — J45909 Unspecified asthma, uncomplicated: Secondary | ICD-10-CM | POA: Insufficient documentation

## 2017-12-21 DIAGNOSIS — Z79899 Other long term (current) drug therapy: Secondary | ICD-10-CM | POA: Insufficient documentation

## 2017-12-21 DIAGNOSIS — Z91040 Latex allergy status: Secondary | ICD-10-CM | POA: Insufficient documentation

## 2017-12-21 DIAGNOSIS — N3001 Acute cystitis with hematuria: Secondary | ICD-10-CM

## 2017-12-21 DIAGNOSIS — R319 Hematuria, unspecified: Secondary | ICD-10-CM | POA: Diagnosis present

## 2017-12-21 LAB — URINALYSIS, ROUTINE W REFLEX MICROSCOPIC
BILIRUBIN URINE: NEGATIVE
Glucose, UA: NEGATIVE mg/dL
HGB URINE DIPSTICK: NEGATIVE
KETONES UR: NEGATIVE mg/dL
NITRITE: POSITIVE — AB
PROTEIN: NEGATIVE mg/dL
Specific Gravity, Urine: 1.025 (ref 1.005–1.030)
pH: 6 (ref 5.0–8.0)

## 2017-12-21 LAB — POC URINE PREG, ED: Preg Test, Ur: NEGATIVE

## 2017-12-21 MED ORDER — NORETHIN-ETH ESTRAD TRIPHASIC 0.5/0.75/1-35 MG-MCG PO TABS: 1.0000 | ORAL_TABLET | Freq: Every day | ORAL | 11 refills | Status: DC

## 2017-12-21 MED ORDER — CEPHALEXIN 500 MG PO CAPS: 500.0000 mg | ORAL_CAPSULE | Freq: Four times a day (QID) | ORAL | 0 refills | Status: DC

## 2017-12-21 NOTE — ED Triage Notes (Signed)
Patient complains of dysuria x 1 month with urinary frequency and urgency. Also developed flank pain with same

## 2017-12-21 NOTE — ED Provider Notes (Signed)
[facility] Provider Note   CSN: [id number] Arrival date & time: 12/21/17  0805     History   Chief Complaint No chief complaint on file.   HPI Phyllis Phillips is a 27 y.o. female.  HPI 27 year old female complaining of frequency of urination, urgency of urination, and dysuria for the past 3 weeks.  She states she had a termination of a pregnancy a month ago and this started after that.  She reports urinary tract infections in the past but not recently.  She denies any abnormal vaginal discharge but she has not had a normal menstrual cycle since the termination.  She is not not using birth control currently.  She denies fever, chills, nausea, or vomiting.   Past Medical History:  Diagnosis Date  . Anxiety   . Asthma    last used a month ago  . Depression   . Recurrent upper respiratory infection (URI)   . Shingles     Patient Active Problem List   Diagnosis Date Noted  . Normal labor 03/21/2017    Past Surgical History:  Procedure Laterality Date  . HAND TENDON SURGERY    . WISDOM TOOTH EXTRACTION       OB History    Gravida  4   Para  2   Term  2   Preterm      AB  2   Living  1     SAB  1   TAB  1   Ectopic      Multiple  0   Live Births  1            Home Medications    Prior to Admission medications   Medication Sig Start Date End Date Taking? Authorizing Provider  albuterol (PROVENTIL HFA;VENTOLIN HFA) 108 (90 Base) MCG/ACT inhaler Inhale 2 puffs into the lungs every 6 (six) hours as needed for wheezing or shortness of breath.    [provider]  cyclobenzaprine (FLEXERIL) 5 MG tablet Take 5 mg by mouth 2 (two) times daily as needed for muscle spasms.    [provider]  ibuprofen (ADVIL,MOTRIN) 600 MG tablet Take 1 tablet (600 mg total) by mouth every 6 (six) hours. 03/23/17   Howard PouchFeng, Lauren, MD  Prenatal Vit-Fe Fumarate-FA (PRENATAL MULTIVITAMIN) TABS tablet Take 1 tablet by mouth  daily at 12 noon. 03/23/17   Howard PouchFeng, Lauren, MD  senna-docusate (SENOKOT-S) 8.6-50 MG tablet Take 2 tablets by mouth at bedtime as needed for mild constipation. 03/23/17   Howard PouchFeng, Lauren, MD    Family History Family History  Problem Relation Age of Onset  . Cancer Mother   . Early death Mother   . Arthritis Father   . Cancer Father   . Diabetes Father   . Hypertension Father   . Varicose Veins Father     Social History Social History   Tobacco Use  . Smoking status: Never Smoker  . Smokeless tobacco: Never Used  Substance Use Topics  . Alcohol use: Yes    Comment: social  . Drug use: Yes    Types: Marijuana     Allergies   Latex   Review of Systems Review of Systems  All other systems reviewed and are negative.    Physical Exam Updated Vital Signs BP 126/78 (BP Location: Right Arm)   Pulse 66   Temp 97.7 F (36.5 C) (Oral)   Resp 15   SpO2 100%   Physical Exam  Constitutional: She  is oriented to person, place, and time. She appears well-developed and well-nourished.  HENT:  Head: Normocephalic and atraumatic.  Right Ear: External ear normal.  Left Ear: External ear normal.  Nose: Nose normal.  Eyes: Pupils are equal, round, and reactive to light.  Cardiovascular: Normal rate.  Pulmonary/Chest: Effort normal.  Abdominal: Soft.  Musculoskeletal: Normal range of motion. She exhibits no tenderness.  No CVA tenderness  Neurological: She is alert and oriented to person, place, and time.  Skin: Skin is warm. Capillary refill takes less than 2 seconds.  Psychiatric: She has a normal mood and affect.  Nursing note and vitals reviewed.    ED Treatments / Results  Labs (all labs ordered are listed, but only abnormal results are displayed) Labs Reviewed  URINALYSIS, ROUTINE W REFLEX MICROSCOPIC  POC URINE PREG, ED    EKG None  Radiology No results found.  Procedures Procedures (including critical care time)  Medications Ordered in ED Medications -  No data to display   Initial Impression / Assessment and Plan / ED Course  I have reviewed the triage vital signs and the nursing notes.  Pertinent labs & imaging results that were available during my care of the patient were reviewed by me and considered in my medical decision making (see chart for details).    1- uti- tx with keflex 2- will rx bcp- patient advised re need for follow up  Final Clinical Impressions(s) / ED Diagnoses   Final diagnoses:  Acute cystitis with hematuria    ED Discharge Orders    None       Margarita Grizzle, MD 12/21/17 [phone]

## 2018-05-31 ENCOUNTER — Encounter (HOSPITAL_COMMUNITY): Payer: Self-pay | Admitting: *Deleted

## 2018-05-31 ENCOUNTER — Other Ambulatory Visit: Payer: Self-pay

## 2018-05-31 ENCOUNTER — Inpatient Hospital Stay (HOSPITAL_COMMUNITY)
Admission: AD | Admit: 2018-05-31 | Discharge: 2018-05-31 | Disposition: A | Discharge status: Home / Self Care | Payer: Medicaid Other | Source: Ambulatory Visit | Attending: Obstetrics and Gynecology | Admitting: Obstetrics and Gynecology

## 2018-05-31 ENCOUNTER — Inpatient Hospital Stay (HOSPITAL_COMMUNITY): Payer: Medicaid Other

## 2018-05-31 DIAGNOSIS — R109 Unspecified abdominal pain: Secondary | ICD-10-CM | POA: Diagnosis not present

## 2018-05-31 DIAGNOSIS — Z3491 Encounter for supervision of normal pregnancy, unspecified, first trimester: Secondary | ICD-10-CM

## 2018-05-31 DIAGNOSIS — M549 Dorsalgia, unspecified: Secondary | ICD-10-CM | POA: Diagnosis not present

## 2018-05-31 DIAGNOSIS — O26891 Other specified pregnancy related conditions, first trimester: Secondary | ICD-10-CM | POA: Diagnosis not present

## 2018-05-31 DIAGNOSIS — R509 Fever, unspecified: Secondary | ICD-10-CM | POA: Insufficient documentation

## 2018-05-31 DIAGNOSIS — Z3A01 Less than 8 weeks gestation of pregnancy: Secondary | ICD-10-CM | POA: Insufficient documentation

## 2018-05-31 HISTORY — DX: Unspecified infectious disease: B99.9

## 2018-05-31 HISTORY — DX: Anemia, unspecified: D64.9

## 2018-05-31 LAB — COMPREHENSIVE METABOLIC PANEL
ALT: 12 U/L (ref 0–44)
AST: 19 U/L (ref 15–41)
Albumin: 3.2 g/dL — ABNORMAL LOW (ref 3.5–5.0)
Alkaline Phosphatase: 64 U/L (ref 38–126)
Anion gap: 12 (ref 5–15)
BUN: 12 mg/dL (ref 6–20)
CO2: 17 mmol/L — ABNORMAL LOW (ref 22–32)
Calcium: 8.7 mg/dL — ABNORMAL LOW (ref 8.9–10.3)
Chloride: 102 mmol/L (ref 98–111)
Creatinine, Ser: 0.8 mg/dL (ref 0.44–1.00)
GFR calc Af Amer: >60 mL/min (ref >60)
GFR calc non Af Amer: >60 mL/min (ref >60)
Glucose, Bld: 113 mg/dL — ABNORMAL HIGH (ref 70–99)
Potassium: 3.9 mmol/L (ref 3.5–5.1)
Sodium: 131 mmol/L — ABNORMAL LOW (ref 135–145)
Total Bilirubin: 0.7 mg/dL (ref 0.3–1.2)
Total Protein: 6.1 g/dL — ABNORMAL LOW (ref 6.5–8.1)

## 2018-05-31 LAB — CBC WITH DIFFERENTIAL/PLATELET
Basophils Absolute: 0 10*3/uL (ref 0.0–0.1)
Basophils Relative: 0 %
Eosinophils Absolute: 0 10*3/uL (ref 0.0–0.7)
Eosinophils Relative: 0 %
HCT: 36.5 % (ref 36.0–46.0)
Hemoglobin: 12 g/dL (ref 12.0–15.0)
Lymphocytes Relative: 12 %
Lymphs Abs: 1.4 10*3/uL (ref 0.7–4.0)
MCH: 27.8 pg (ref 26.0–34.0)
MCHC: 32.9 g/dL (ref 30.0–36.0)
MCV: 84.5 fL (ref 78.0–100.0)
Monocytes Absolute: 0.4 10*3/uL (ref 0.1–1.0)
Monocytes Relative: 3 %
Neutro Abs: 9.8 10*3/uL — ABNORMAL HIGH (ref 1.7–7.7)
Neutrophils Relative %: 85 %
Platelets: 220 10*3/uL (ref 150–400)
RBC: 4.32 MIL/uL (ref 3.87–5.11)
RDW: 14 % (ref 11.5–15.5)
WBC: 11.6 10*3/uL — ABNORMAL HIGH (ref 4.0–10.5)

## 2018-05-31 LAB — TYPE AND SCREEN
ABO/RH(D): A POS
Antibody Screen: NEGATIVE

## 2018-05-31 LAB — URINALYSIS, ROUTINE W REFLEX MICROSCOPIC
Bilirubin Urine: NEGATIVE
Glucose, UA: NEGATIVE mg/dL
Hgb urine dipstick: NEGATIVE
Ketones, ur: 5 mg/dL — AB
Leukocytes, UA: NEGATIVE
Nitrite: NEGATIVE
Protein, ur: NEGATIVE mg/dL
Specific Gravity, Urine: 1.026 (ref 1.005–1.030)
pH: 5 (ref 5.0–8.0)

## 2018-05-31 LAB — WET PREP, GENITAL
Clue Cells Wet Prep HPF POC: NONE SEEN
Sperm: NONE SEEN
Trich, Wet Prep: NONE SEEN
Yeast Wet Prep HPF POC: NONE SEEN

## 2018-05-31 LAB — POCT PREGNANCY, URINE: Preg Test, Ur: POSITIVE — AB

## 2018-05-31 LAB — GROUP A STREP BY PCR: Group A Strep by PCR: DETECTED — AB

## 2018-05-31 LAB — HCG, QUANTITATIVE, PREGNANCY: hCG, Beta Chain, Quant, S: 17263 m[IU]/mL — ABNORMAL HIGH (ref <5)

## 2018-05-31 MED ORDER — MENTHOL 3 MG MT LOZG
1.0000 | LOZENGE | OROMUCOSAL | Status: DC | PRN
  Administered 2018-05-31: 3 mg via ORAL
  Filled 2018-05-31: qty 9

## 2018-05-31 MED ORDER — ACETAMINOPHEN 500 MG PO TABS
1000.0000 mg | ORAL_TABLET | Freq: Once | ORAL | Status: AC
  Administered 2018-05-31: 1000 mg via ORAL
  Filled 2018-05-31: qty 2

## 2018-05-31 MED ORDER — PROMETHAZINE HCL 12.5 MG PO TABS: 12.5000 mg | ORAL_TABLET | Freq: Four times a day (QID) | ORAL | 1 refills | Status: DC | PRN

## 2018-05-31 MED ORDER — LACTATED RINGERS IV BOLUS
1000.0000 mL | Freq: Once | INTRAVENOUS | Status: AC
  Administered 2018-05-31: 1000 mL via INTRAVENOUS

## 2018-05-31 MED ORDER — PROMETHAZINE HCL 25 MG/ML IJ SOLN
12.5000 mg | Freq: Once | INTRAMUSCULAR | Status: AC
  Administered 2018-05-31: 12.5 mg via INTRAVENOUS
  Filled 2018-05-31: qty 1

## 2018-05-31 NOTE — MAU Note (Signed)
Pt shaking and unsteady, assisted to rm 7 via wc.  Providers Arita Miss and Aundria Rud) notified of arrival, v/s and complaints.

## 2018-05-31 NOTE — MAU Note (Signed)
Dizzy, throat hurts, stomach is cramping and has a fever she can't get rid of.  Took Motrin like 2 hrs ago and it is still high.  This all started yesterday. Found out she was preg yesterday, +HPT.

## 2018-05-31 NOTE — MAU Note (Signed)
Spoke with [place], alerting to order that is in, requesting bedside due to pt status

## 2018-05-31 NOTE — MAU Provider Note (Signed)
History     CSN: [id number]  Arrival date and time: 05/31/18 1558   First Provider Initiated Contact with Patient 05/31/18 1626      Chief Complaint  Patient presents with  . Abdominal Pain  . Back Pain  . Fever  . Nausea  . Possible Pregnancy   Phyllis Phillips is a 27 y.o. G6P2 at [redacted]w[redacted]d by LMP who presents to MAU with complains of abdominal pain, back pain, nausea and fever.  Abdominal Pain  This is a new problem. The current episode started yesterday. The onset quality is sudden. The problem occurs intermittently. The problem has been gradually worsening. The pain is located in the suprapubic region. The pain is at a severity of 6/10. The quality of the pain is aching, cramping and sharp. The abdominal pain radiates to the back. Associated symptoms include a fever, nausea and vomiting. Pertinent negatives include no constipation or diarrhea. The pain is aggravated by palpation and vomiting. The pain is relieved by nothing. Treatments tried: Motrin. The treatment provided no relief.  Back Pain  This is a new problem. The current episode started yesterday. The problem occurs constantly. The problem is unchanged. The quality of the pain is described as cramping. The pain is at a severity of 6/10. The pain is the same all the time. The symptoms are aggravated by position. Associated symptoms include abdominal pain and a fever. Risk factors include pregnancy. She has tried NSAIDs for the symptoms. The treatment provided no relief.  Fever   This is a new problem. The current episode started today. The maximum temperature noted was 100 to 100.9 F. The temperature was taken using an oral thermometer. Associated symptoms include abdominal pain, nausea, a sore throat and vomiting. Pertinent negatives include no congestion or diarrhea. She has tried NSAIDs for the symptoms. The treatment provided no relief.  Risk factors: no recent sickness, no recent travel and no sick contacts    [redacted]w[redacted]d by LMP  OB  History    Gravida  6   Para  2   Term  2   Preterm      AB  3   Living  1     SAB  1   TAB  2   Ectopic      Multiple  0   Live Births  1           Past Medical History:  Diagnosis Date  . Anemia   . Anxiety   . Asthma    last used a month ago  . Depression   . Infection    UTI  . Recurrent upper respiratory infection (URI)   . Shingles     Past Surgical History:  Procedure Laterality Date  . DILATION AND CURETTAGE OF UTERUS    . HAND TENDON SURGERY    . WISDOM TOOTH EXTRACTION      Family History  Problem Relation Age of Onset  . Cancer Mother   . Early death Mother   . Arthritis Father   . Diabetes Father   . Hypertension Father   . Varicose Veins Father     Social History   Tobacco Use  . Smoking status: Former Games developer  . Smokeless tobacco: Never Used  Substance Use Topics  . Alcohol use: Yes    Comment: rarely  . Drug use: Not Currently    Types: Marijuana    Comment: "long time ago"    Allergies:  Allergies  Allergen Reactions  . Latex  Other (See Comments)    Causes burning.    Medications Prior to Admission  Medication Sig Dispense Refill Last Dose  . albuterol (PROVENTIL HFA;VENTOLIN HFA) 108 (90 Base) MCG/ACT inhaler Inhale 2 puffs into the lungs every 6 (six) hours as needed for wheezing or shortness of breath.   rescue  . cephALEXin (KEFLEX) 500 MG capsule Take 1 capsule (500 mg total) by mouth 4 (four) times daily. 20 capsule 0   . cyclobenzaprine (FLEXERIL) 5 MG tablet Take 5 mg by mouth 2 (two) times daily as needed for muscle spasms.   03/20/2017 at Unknown time  . ibuprofen (ADVIL,MOTRIN) 600 MG tablet Take 1 tablet (600 mg total) by mouth every 6 (six) hours. 30 tablet 0   . norethindrone-ethinyl estradiol (ORTHO-NOVUM 7/7/7, 28,) 0.5/0.75/1-35 MG-MCG tablet Take 1 tablet by mouth daily. 1 Package 11   . Prenatal Vit-Fe Fumarate-FA (PRENATAL MULTIVITAMIN) TABS tablet Take 1 tablet by mouth daily at 12 noon. 30 tablet  0   . senna-docusate (SENOKOT-S) 8.6-50 MG tablet Take 2 tablets by mouth at bedtime as needed for mild constipation. 30 tablet 0     Review of Systems  Constitutional: Positive for fever. Negative for chills.  HENT: Positive for sore throat. Negative for congestion, sinus pressure, sinus pain and sneezing.   Respiratory: Negative.   Cardiovascular: Negative.   Gastrointestinal: Positive for abdominal pain, nausea and vomiting. Negative for constipation and diarrhea.  Genitourinary: Negative.   Musculoskeletal: Positive for back pain.  Neurological: Negative.    Physical Exam   Blood pressure 111/74, pulse 89, temperature 100.2 F (37.9 C), temperature source Oral, resp. rate 18, height 5\' 6"  (1.676 m), weight 90 kg, last menstrual period 05/11/2018, SpO2 100 %, unknown if currently breastfeeding.  Physical Exam  Nursing note and vitals reviewed. Constitutional: She is oriented to person, place, and time. She appears well-developed and well-nourished. She appears distressed.  HENT:  Head: Normocephalic.  Cardiovascular: Normal rate, regular rhythm and normal heart sounds.  Respiratory: Effort normal and breath sounds normal. No respiratory distress. She has no wheezes.  GI: Bowel sounds are normal. She exhibits no distension and no mass. There is tenderness. There is guarding. There is no rebound.  Genitourinary: No bleeding in the vagina. No vaginal discharge found.  Genitourinary Comments: Vaginal swabs obtained for culture, patient declined pelvic exam due to pain  Musculoskeletal: Normal range of motion. She exhibits no edema.  Neurological: She is alert and oriented to person, place, and time.  Skin: Skin is warm and dry.  Psychiatric: She has a normal mood and affect. Her behavior is normal. Thought content normal.    MAU Course  Procedures  MDM Orders Placed This Encounter  Procedures  . Wet prep, genital  . Group A Strep by PCR  . Culture, OB Urine  . US OB  Transvaginal  . CBC with Differential/Platelet  . hCG, quantitative, pregnancy  . Comprehensive metabolic panel  . Urinalysis, Routine w reflex microscopic  . HIV antibody (routine testing) (NOT for [facility])  . Pregnancy, urine POC  . Type and screen [facility]  . Insert peripheral IV   Meds ordered this encounter  Medications  . lactated ringers bolus 1,000 mL  . promethazine (PHENERGAN) injection 12.5 mg  . acetaminophen (TYLENOL) tablet 1,000 mg  . promethazine (PHENERGAN) 12.5 MG tablet    Sig: Take 1 tablet (12.5 mg total) by mouth every 6 (six) hours as needed for nausea or vomiting.    Dispense:  30 tablet    Refill:  1    Order Specific Question:   Supervising Provider    Answer:   Conan Bowens [[id number]]  . menthol-cetylpyridinium (CEPACOL) lozenge 3 mg   Patient presents to MAU distressed and writhing in pain. Ectopic vs IUP workup.    Treatments in MAU included IV LR bolus, phenergan 12.5mg  IV, Tylenol 1000mg  PO and cepacol lozenge. Patient reports slight decrease in abdominal pain and back pain with Tylenol. Patient drove to MAU and unable to be given Flexeril for pain- patient has Flexeril prescribed for home use.   Patient able to tolerate PO meds and water prior to discharge home- no vomiting occurrences while in MAU.   Labs reviewed:  Wet prep- negative  GC/C and HIV -pending  UPT- positive  CBC with diff- WNL  CMP- elevated glucose otherwise negative  Urine culture pending  Rapid strep- pending   Results for orders placed or performed during the hospital encounter of 05/31/18 (from the past 24 hour(s))  CBC with Differential/Platelet     Status: Abnormal   Collection Time: 05/31/18  4:20 PM  Result Value Ref Range   WBC 11.6 (H) 4.0 - 10.5 K/uL   RBC 4.32 3.87 - 5.11 MIL/uL   Hemoglobin 12.0 12.0 - 15.0 g/dL   HCT 14.7 82.9 - 56.2 %   MCV 84.5 78.0 - 100.0 fL   MCH 27.8 26.0 - 34.0 pg   MCHC 32.9 30.0 - 36.0 g/dL   RDW 13.0 86.5 -  78.4 %   Platelets 220 150 - 400 K/uL   Neutrophils Relative % 85 %   Neutro Abs 9.8 (H) 1.7 - 7.7 K/uL   Lymphocytes Relative 12 %   Lymphs Abs 1.4 0.7 - 4.0 K/uL   Monocytes Relative 3 %   Monocytes Absolute 0.4 0.1 - 1.0 K/uL   Eosinophils Relative 0 %   Eosinophils Absolute 0.0 0.0 - 0.7 K/uL   Basophils Relative 0 %   Basophils Absolute 0.0 0.0 - 0.1 K/uL  hCG, quantitative, pregnancy     Status: Abnormal   Collection Time: 05/31/18  4:20 PM  Result Value Ref Range   hCG, Beta Chain, Quant, S 17,263 (H) <5 mIU/mL  Comprehensive metabolic panel     Status: Abnormal   Collection Time: 05/31/18  4:20 PM  Result Value Ref Range   Sodium 131 (L) 135 - 145 mmol/L   Potassium 3.9 3.5 - 5.1 mmol/L   Chloride 102 98 - 111 mmol/L   CO2 17 (L) 22 - 32 mmol/L   Glucose, Bld 113 (H) 70 - 99 mg/dL   BUN 12 6 - 20 mg/dL   Creatinine, Ser 6.96 0.44 - 1.00 mg/dL   Calcium 8.7 (L) 8.9 - 10.3 mg/dL   Total Protein 6.1 (L) 6.5 - 8.1 g/dL   Albumin 3.2 (L) 3.5 - 5.0 g/dL   AST 19 15 - 41 U/L   ALT 12 0 - 44 U/L   Alkaline Phosphatase 64 38 - 126 U/L   Total Bilirubin 0.7 0.3 - 1.2 mg/dL   GFR calc non Af Amer >60 >60 mL/min   GFR calc Af Amer >60 >60 mL/min   Anion gap 12 5 - 15  Urinalysis, Routine w reflex microscopic     Status: Abnormal   Collection Time: 05/31/18  4:22 PM  Result Value Ref Range   Color, Urine YELLOW YELLOW   APPearance HAZY (A) CLEAR   Specific Gravity, Urine 1.026 1.005 -  1.030   pH 5.0 5.0 - 8.0   Glucose, UA NEGATIVE NEGATIVE mg/dL   Hgb urine dipstick NEGATIVE NEGATIVE   Bilirubin Urine NEGATIVE NEGATIVE   Ketones, ur 5 (A) NEGATIVE mg/dL   Protein, ur NEGATIVE NEGATIVE mg/dL   Nitrite NEGATIVE NEGATIVE   Leukocytes, UA NEGATIVE NEGATIVE  Wet prep, genital     Status: Abnormal   Collection Time: 05/31/18  4:36 PM  Result Value Ref Range   Yeast Wet Prep HPF POC NONE SEEN NONE SEEN   Trich, Wet Prep NONE SEEN NONE SEEN   Clue Cells Wet Prep HPF POC  NONE SEEN NONE SEEN   WBC, Wet Prep HPF POC FEW (A) NONE SEEN   Sperm NONE SEEN   Type and screen [facility] [place]     Status: None   Collection Time: 05/31/18  4:36 PM  Result Value Ref Range   ABO/RH(D) A POS    Antibody Screen NEG    Sample Expiration      06/03/2018 Performed at [facility], [address], [place], [place] [address]   Pregnancy, urine POC     Status: Abnormal   Collection Time: 05/31/18  4:36 PM  Result Value Ref Range   Preg Test, Ur POSITIVE (A) NEGATIVE   US Ob Transvaginal  Result Date: 05/31/2018 CLINICAL DATA:  Abdominal pain during first trimester of pregnancy EXAM: TRANSVAGINAL OB ULTRASOUND TECHNIQUE: Transvaginal ultrasound was performed for complete evaluation of the gestation as well as the maternal uterus, adnexal regions, and pelvic cul-de-sac. COMPARISON:  None for this gestation FINDINGS: Intrauterine gestational sac: Present, single Yolk sac:  Present Embryo:  Present Cardiac Activity: Present Heart Rate: 116 bpm CRL:   2.8 mm   5 w 5 d                  [place] EDC: 01/26/2019 Subchorionic hemorrhage:  None visualized. Maternal uterus/adnexae: LEFT ovary normal size and morphology, 2.9 x 1.9 x 1.9 cm. RIGHT ovary normal size and morphology, 4.1 x 2.1 x 2.4 cm. No adnexal masses or free pelvic fluid. IMPRESSION: Single live intrauterine gestation at 5 weeks 5 days EGA by crown-rump length as above. No acute abnormalities. Electronically Signed   By: Ulyses Southward M.D.   On: 05/31/2018 17:58   Consult with Dr Despina Hidden with assessment and management of patient due to continue temperature after Tylenol. Okay to discharge home. Most likely virus that is causing fever with normal CBC and CMP. Strep culture collected.   [place] and lab results reviewed with patient. US shows normal IUP measuring 5 weeks a 5 days, due date changed to match [place] dating. Patient reports feeling slightly better but complains of continued throat soreness- strep culture pending.  Educated and discussed reasons to return to MAU. Will call patient with results of Strep once it returns if positive. Patient verbalizes understanding. Rx for Phenergan sent to pharmacy of choice.   Assessment and Plan   1. Normal IUP (intrauterine pregnancy) on prenatal ultrasound, first trimester   2. Abdominal pain during pregnancy in first trimester   3. [redacted] weeks gestation of pregnancy   4. Back pain during pregnancy in first trimester   5. Fever in adult    Discharge home  Discussed reasons to return to MAU  Strep culture pending- will call patient with positive results and manage accordingly  List of [place] providers given to patient  Safe medications in pregnancy discussed- list given  Rx for Phenergan sent to  pharmacy of choice   Allergies as of 05/31/2018      Reactions   Latex Other (See Comments)   Causes burning.      Medication List    STOP taking these medications   cephALEXin 500 MG capsule Commonly known as:  KEFLEX   ibuprofen 600 MG tablet Commonly known as:  ADVIL,MOTRIN   norethindrone-ethinyl estradiol 0.5/0.75/1-35 MG-MCG tablet Commonly known as:  CYCLAFEM,ALYACEN   senna-docusate 8.6-50 MG tablet Commonly known as:  Senokot-S     TAKE these medications   albuterol 108 (90 Base) MCG/ACT inhaler Commonly known as:  PROVENTIL HFA;VENTOLIN HFA Inhale 2 puffs into the lungs every 6 (six) hours as needed for wheezing or shortness of breath.   cyclobenzaprine 5 MG tablet Commonly known as:  FLEXERIL Take 5 mg by mouth 2 (two) times daily as needed for muscle spasms.   prenatal multivitamin Tabs tablet Take 1 tablet by mouth daily at 12 noon.   promethazine 12.5 MG tablet Commonly known as:  PHENERGAN Take 1 tablet (12.5 mg total) by mouth every 6 (six) hours as needed for nausea or vomiting.       Sharyon Cable CNM 05/31/2018, 10:23 PM

## 2018-06-01 LAB — HIV ANTIBODY (ROUTINE TESTING W REFLEX): HIV Screen 4th Generation wRfx: NONREACTIVE

## 2018-06-01 LAB — GC/CHLAMYDIA PROBE AMP (~~LOC~~) NOT AT ARMC
Chlamydia: NEGATIVE
Neisseria Gonorrhea: NEGATIVE

## 2018-06-02 LAB — CULTURE, OB URINE: Culture: 30000 — AB

## 2018-06-03 ENCOUNTER — Encounter: Payer: Self-pay | Admitting: Student

## 2018-06-03 ENCOUNTER — Telehealth: Payer: Self-pay | Admitting: Student

## 2018-06-03 ENCOUNTER — Other Ambulatory Visit: Payer: Self-pay | Admitting: Student

## 2018-06-03 DIAGNOSIS — R8271 Bacteriuria: Secondary | ICD-10-CM

## 2018-06-03 MED ORDER — AMOXICILLIN 875 MG PO TABS: 875.0000 mg | ORAL_TABLET | Freq: Two times a day (BID) | ORAL | 0 refills | Status: AC

## 2018-06-03 NOTE — Telephone Encounter (Signed)
Talked to patient and informed her of her GBS infection and antibiotics waiting for her at the pharmacy. Reviewed importance of taking her antibiotics and finishing the course.

## 2018-07-05 ENCOUNTER — Telehealth: Payer: Self-pay | Admitting: Clinical

## 2018-07-05 ENCOUNTER — Ambulatory Visit (INDEPENDENT_AMBULATORY_CARE_PROVIDER_SITE_OTHER): Payer: Medicaid Other | Admitting: Clinical

## 2018-07-05 ENCOUNTER — Ambulatory Visit (INDEPENDENT_AMBULATORY_CARE_PROVIDER_SITE_OTHER): Payer: Medicaid Other | Admitting: Advanced Practice Midwife

## 2018-07-05 ENCOUNTER — Encounter: Payer: Self-pay | Admitting: Advanced Practice Midwife

## 2018-07-05 DIAGNOSIS — F332 Major depressive disorder, recurrent severe without psychotic features: Secondary | ICD-10-CM

## 2018-07-05 DIAGNOSIS — Z658 Other specified problems related to psychosocial circumstances: Secondary | ICD-10-CM | POA: Diagnosis not present

## 2018-07-05 DIAGNOSIS — Z3480 Encounter for supervision of other normal pregnancy, unspecified trimester: Secondary | ICD-10-CM | POA: Insufficient documentation

## 2018-07-05 LAB — POCT URINALYSIS DIP (DEVICE)
BILIRUBIN URINE: NEGATIVE
GLUCOSE, UA: NEGATIVE mg/dL
Hgb urine dipstick: NEGATIVE
Leukocytes, UA: NEGATIVE
Nitrite: NEGATIVE
PROTEIN: NEGATIVE mg/dL
SPECIFIC GRAVITY, URINE: 1.02 (ref 1.005–1.030)
Urobilinogen, UA: 0.2 mg/dL (ref 0.0–1.0)
pH: 7 (ref 5.0–8.0)

## 2018-07-05 NOTE — Progress Notes (Signed)
  Subjective:    Phyllis Phillips is being seen today for her first obstetrical visit.  This is not a planned pregnancy. Pregnancy is not desired. Pt is tearful. States she can barely take care of the kids that she has. She is at [redacted]w[redacted]d gestation by 5.5 wk [place]. Her obstetrical history is significant for macrosomia and obesity. Relationship with FOB: FOB not involved. Patient does not intend to breast feed. Pregnancy history fully reviewed.  Patient reports no complaints.  Review of Systems:   Review of Systems  Gastrointestinal: Negative for abdominal pain, nausea and vomiting.  Genitourinary: Negative for vaginal bleeding and vaginal discharge.  Psychiatric/Behavioral: Positive for dysphoric mood.    Objective:     BP 121/67   Pulse 68   Wt 198 lb 4.8 oz (89.9 kg)   LMP 05/11/2018   BMI 32.01 kg/m  Physical Exam  Nursing note and vitals reviewed. Constitutional: She is oriented to person, place, and time. She appears well-developed and well-nourished. No distress.  HENT:  Head: Normocephalic.  Eyes: No scleral icterus.  Cardiovascular: Normal rate.  Respiratory: Effort normal. No respiratory distress.  GI: Soft. She exhibits no distension. There is no tenderness.  Genitourinary:  Genitourinary Comments: Deferred due to recent exam  Musculoskeletal: She exhibits no edema.  Neurological: She is alert and oriented to person, place, and time.  Skin: Skin is warm and dry.  Psychiatric: She does not exhibit a depressed mood.    Maternal Exam:  Abdomen: Patient reports no abdominal tenderness. Fundal height is 10 cm.       Fetal Exam Fetal Monitor Review: Mode: ultrasound.   Baseline rate: 150.         Assessment:    Pregnancy: [id number] Patient Active Problem List   Diagnosis Date Noted  . Supervision of other normal pregnancy, antepartum 07/05/2018  . GBS bacteriuria 06/03/2018       Plan:  1. Supervision of other normal pregnancy, antepartum  - Culture, OB  Urine - Cystic fibrosis gene test - Cytology - PAP - Genetic Screening - Hemoglobinopathy Evaluation - Obstetric Panel, Including HIV - SMN1 COPY NUMBER ANALYSIS (SMA Carrier Screen) - [place] MFM OB COMP + 14 WK; Future  2. Undesired Pregnancy. Planning adoption. - Integrated Behavioral Health referral - Support given.    Initial labs drawn. Prenatal vitamins. Problem list reviewed and updated. Panorama: requested. Role of ultrasound in pregnancy discussed; fetal survey: requested. Amniocentesis discussed: not indicated. Follow up in 4 weeks. Wants salpingectomy for contraception. Explained that it cannot be done immediately PP.    Dorathy Kinsman 07/05/2018

## 2018-07-05 NOTE — BH Specialist Note (Signed)
Integrated Behavioral Health Initial Visit  MRN: [id number] Name: Phyllis Phillips  Number of Integrated Behavioral Health Clinician visits:: 1/6 Session Start time: 11:21  Session End time: 11:42 Total time: 20 minutes  Type of Service: Integrated Behavioral Health- Individual/Family Interpretor:No. Interpretor Name and Language: n/a   Warm Hand Off Completed.       SUBJECTIVE: Phyllis Phillips is a 27 y.o. female accompanied by n/a Patient was referred by Dorathy Kinsman, CNM for depression and anxiety. Patient reports the following symptoms/concerns: Pt primary symptoms today are depression, fatigue, anhedonia, sleeping all day, lack of appetite, lack of concentration, hopelessness, anxiety, worry, restlessness, irritability and fear. Pt copes with her overwhelming feelings by sleeping. Pt was previously on several BH medications (Abilify, Prozac, etc.)Pt has not switched PCP to local area; her long-term goal is to travel to all 50 states and to other countries.  Duration of problem: Increase in current pregnancy; Severity of problem: severe  OBJECTIVE: Mood: Depressed and Hopeless and Affect: Depressed Risk of harm to self or others: No plan to harm self or others  LIFE CONTEXT: Family and Social: Pt lives with her two children; inadequate social support School/Work: Unemployed Self-Care: - Life Changes: Current pregnancy; financial stress  GOALS ADDRESSED: Patient will: 1. Reduce symptoms of: anxiety, depression and stress 2. Increase knowledge and/or ability of: stress reduction  3. Demonstrate ability to: Increase healthy adjustment to current life circumstances and Increase adequate support systems for patient/family  INTERVENTIONS: Interventions utilized: Motivational Interviewing, Supportive Counseling, Psychoeducation and/or Health Education and Link to [employer]  Standardized Assessments completed: GAD-7 and PHQ 9  ASSESSMENT: Patient currently experiencing  Major depressive disorder, recurrent, severe, and Psychosocial stressors.   Patient may benefit from psychoeducation and brief therapeutic interventions regarding coping with symptoms of depression and anxiety .  PLAN: 1. Follow up with behavioral health clinician on : One month, or earlier, at patient or provider request 2. Behavioral recommendations:  -Go to scheduled appointment this week at [facility] -Continue taking prenatal vitamin, as recommended by medical provider -Walk-in clinic at either [facility] or [facility], to establish care prior to the next medical appointment -Call Medicaid worker this week to change PCP to [facility] area -Read educational materials regarding coping with symptoms of depression and anxiety  3. Referral(s): Integrated Art gallery manager (In Clinic), [facility] (LME/Outside Clinic) and [employer]:  [employer], Transportation 4. "From scale of 1-10, how likely are you to follow plan?": 6  Rae Lips, LCSW  Depression screen [facility] 2/9 07/05/2018  Decreased Interest 3  Down, Depressed, Hopeless 3  PHQ - 2 Score 6  Altered sleeping 3  Tired, decreased energy 3  Change in appetite 3  Feeling bad or failure about yourself  3  Trouble concentrating 3  Moving slowly or fidgety/restless 1  Suicidal thoughts 0  PHQ-9 Score 22   GAD 7 : Generalized Anxiety Score 07/05/2018  Nervous, Anxious, on Edge 3  Control/stop worrying 3  Worry too much - different things 3  Trouble relaxing 3  Restless 3  Easily annoyed or irritable 3  Afraid - awful might happen 3  Total GAD 7 Score 21

## 2018-07-05 NOTE — Telephone Encounter (Signed)
error 

## 2018-07-06 ENCOUNTER — Encounter: Payer: Self-pay | Admitting: *Deleted

## 2018-07-06 NOTE — Patient Instructions (Signed)
Salpingectomy Salpingectomy, also called tubectomy, is the surgical removal of one of the fallopian tubes. The fallopian tubes are where eggs travel from the ovaries to the uterus. Removing one fallopian tube does not prevent you from becoming pregnant. It also does not cause problems with your menstrual periods. You may need a salpingectomy if you:  Have a fertilized egg that attaches to the fallopian tube (ectopic pregnancy), especially one that causes the tube to burst or tear (rupture).  Have an infected fallopian tube.  Have cancer of the fallopian tube or nearby organs.  Have had an ovary removed due to a cyst or tumor.  Have had your uterus removed.  There are three different methods that can be used for a salpingectomy:  Open. This method involves making one large incision in your abdomen.  Laparoscopic. This method involves using a thin, lighted tube with a tiny camera on the end (laparoscope) to help perform the procedure. The laparoscope will allow your surgeon to make several small incisions in the abdomen instead of a large incision.  Robot-assisted: This method involves using a computer to control surgical instruments that are attached to robotic arms.  Tell a health care provider about:  Any allergies you have.  All medicines you are taking, including vitamins, herbs, eye drops, creams, and over-the-counter medicines.  Any problems you or family members have had with anesthetic medicines.  Any blood disorders you have.  Any surgeries you have had.  Any medical conditions you have.  Whether you are pregnant or may be pregnant. What are the risks? Generally, this is a safe procedure. However, problems may occur, including:  Infection.  Bleeding.  Allergic reactions to medicines.  Damage to other structures or organs.  Blood clots in the legs or lungs.  What happens before the procedure? Staying hydrated Follow instructions from your health care provider  about hydration, which may include:  Up to 2 hours before the procedure - you may continue to drink clear liquids, such as water, clear fruit juice, black coffee, and plain tea.  Eating and drinking restrictions Follow instructions from your health care provider about eating and drinking, which may include:  8 hours before the procedure - stop eating heavy meals or foods such as meat, fried foods, or fatty foods.  6 hours before the procedure - stop eating light meals or foods, such as toast or cereal.  6 hours before the procedure - stop drinking milk or drinks that contain milk.  2 hours before the procedure - stop drinking clear liquids.  Medicines  Ask your health care provider about: ? Changing or stopping your regular medicines. This is especially important if you are taking diabetes medicines or blood thinners. ? Taking medicines such as aspirin and ibuprofen. These medicines can thin your blood. Do not take these medicines before your procedure if your health care provider instructs you not to.  You may be given antibiotic medicine to help prevent infection. General instructions  Do not smoke for at least 2 weeks before your procedure. If you need help quitting, ask your health care provider.  You may have an exam or tests, such as an electrocardiogram (ECG).  You may have a blood or urine sample taken.  Ask your health care provider: ? Whether you should stop removing hair from your surgical area. ? How your surgical site will be marked or identified.  You may be asked to shower with a germ-killing soap.  Plan to have someone take you home   from the hospital or clinic.  If you will be going home right after the procedure, plan to have someone with you for 24 hours. What happens during the procedure?  To reduce your risk of infection: ? Your health care team will wash or sanitize their hands. ? Hair may be removed from the surgical area. ? Your skin will be washed  with soap.  An IV tube will be inserted into one of your veins.  You will be given a medicine to make you fall asleep (general anesthetic). You may also be given a medicine to help you relax (sedative).  A thin tube (catheter) may be inserted through your urethra and into your bladder to drain urine during your procedure.  Depending on the type of procedure you are having, one incision or several small incisions will be made in your abdomen.  Your fallopian tube will be cut and removed from where it attaches to your uterus.  Your blood vessels will be clamped and tied to prevent excess bleeding.  The incision(s) in your abdomen will be closed with stitches (sutures), staples, or skin glue.  A bandage (dressing) may be placed over your incision(s). The procedure may vary among health care providers and hospitals. What happens after the procedure?  Your blood pressure, heart rate, breathing rate, and blood oxygen level will be monitored until the medicines you were given have worn off.  You may continue to receive fluids and medicines through an IV tube.  You may continue to have a catheter draining your urine.  You may have to wear compression stockings. These stockings help to prevent blood clots and reduce swelling in your legs.  You will be given pain medicine as needed.  Do not drive for 24 hours if you received a sedative. Summary  Salpingectomy is a surgical procedure to remove one of the fallopian tubes.  The procedure may be done with an open incision, with a laparoscope, or with computer-controlled instruments.  Depending on the type of procedure you are having, one incision or several small incisions will be made in your abdomen.  Your blood pressure, heart rate, breathing rate, and blood oxygen level will be monitored until the medicines you were given have worn off.  Plan to have someone take you home from the hospital or clinic. This information is not intended  to replace advice given to you by your health care provider. Make sure you discuss any questions you have with your health care provider. Document Released: 01/30/2009 Document Revised: 04/30/2016 Document Reviewed: 03/07/2013 Elsevier Interactive Patient Education  2018 Elsevier Inc.  

## 2018-07-10 LAB — CULTURE, OB URINE

## 2018-07-10 LAB — URINE CULTURE, OB REFLEX

## 2018-07-13 LAB — HEMOGLOBINOPATHY EVALUATION
Ferritin: 11 ng/mL — ABNORMAL LOW (ref 15–150)
HGB A2 QUANT: 1.7 % — AB (ref 1.8–3.2)
HGB VARIANT: 0 %
Hgb A: 98.3 % (ref 96.4–98.8)
Hgb C: 0 %
Hgb F Quant: 0 % (ref 0.0–2.0)
Hgb S: 0 %
Hgb Solubility: NEGATIVE

## 2018-07-13 LAB — SMN1 COPY NUMBER ANALYSIS (SMA CARRIER SCREENING)

## 2018-07-13 LAB — OBSTETRIC PANEL, INCLUDING HIV
Antibody Screen: NEGATIVE
BASOS ABS: 0 10*3/uL (ref 0.0–0.2)
Basos: 1 %
EOS (ABSOLUTE): 0 10*3/uL (ref 0.0–0.4)
Eos: 1 %
HEP B S AG: NEGATIVE
HIV Screen 4th Generation wRfx: NONREACTIVE
Hematocrit: 35.9 % (ref 34.0–46.6)
Hemoglobin: 12.2 g/dL (ref 11.1–15.9)
IMMATURE GRANULOCYTES: 0 %
Immature Grans (Abs): 0 10*3/uL (ref 0.0–0.1)
LYMPHS ABS: 1.8 10*3/uL (ref 0.7–3.1)
Lymphs: 35 %
MCH: 28 pg (ref 26.6–33.0)
MCHC: 34 g/dL (ref 31.5–35.7)
MCV: 83 fL (ref 79–97)
MONOCYTES: 7 %
Monocytes Absolute: 0.4 10*3/uL (ref 0.1–0.9)
NEUTROS PCT: 56 %
Neutrophils Absolute: 2.9 10*3/uL (ref 1.4–7.0)
PLATELETS: 244 10*3/uL (ref 150–450)
RBC: 4.35 x10E6/uL (ref 3.77–5.28)
RDW: 14 % (ref 12.3–15.4)
RPR: NONREACTIVE
RUBELLA: 2.59 {index} (ref >0.99)
Rh Factor: POSITIVE
WBC: 5.2 10*3/uL (ref 3.4–10.8)

## 2018-07-13 LAB — CYSTIC FIBROSIS GENE TEST

## 2018-08-02 ENCOUNTER — Ambulatory Visit (INDEPENDENT_AMBULATORY_CARE_PROVIDER_SITE_OTHER): Payer: Medicaid Other | Admitting: Nurse Practitioner

## 2018-08-02 ENCOUNTER — Ambulatory Visit: Payer: Medicaid Other | Attending: Nurse Practitioner | Admitting: Nurse Practitioner

## 2018-08-02 ENCOUNTER — Encounter: Payer: Self-pay | Admitting: Nurse Practitioner

## 2018-08-02 ENCOUNTER — Telehealth: Payer: Self-pay

## 2018-08-02 VITALS — BP 107/66 | HR 81 | Wt 196.3 lb

## 2018-08-02 VITALS — BP 128/75 | HR 75 | Temp 98.7°F | Ht 66.0 in | Wt 196.0 lb

## 2018-08-02 DIAGNOSIS — Z3480 Encounter for supervision of other normal pregnancy, unspecified trimester: Secondary | ICD-10-CM

## 2018-08-02 DIAGNOSIS — Z79899 Other long term (current) drug therapy: Secondary | ICD-10-CM | POA: Insufficient documentation

## 2018-08-02 DIAGNOSIS — O219 Vomiting of pregnancy, unspecified: Secondary | ICD-10-CM

## 2018-08-02 DIAGNOSIS — J45909 Unspecified asthma, uncomplicated: Secondary | ICD-10-CM | POA: Insufficient documentation

## 2018-08-02 DIAGNOSIS — Z640 Problems related to unwanted pregnancy: Secondary | ICD-10-CM | POA: Diagnosis not present

## 2018-08-02 DIAGNOSIS — Z3482 Encounter for supervision of other normal pregnancy, second trimester: Secondary | ICD-10-CM

## 2018-08-02 DIAGNOSIS — Z8249 Family history of ischemic heart disease and other diseases of the circulatory system: Secondary | ICD-10-CM | POA: Diagnosis not present

## 2018-08-02 DIAGNOSIS — F332 Major depressive disorder, recurrent severe without psychotic features: Secondary | ICD-10-CM | POA: Diagnosis present

## 2018-08-02 MED ORDER — VITAFOL GUMMIES 3.33-0.333-34.8 MG PO CHEW: 3.0000 | CHEWABLE_TABLET | Freq: Every day | ORAL | 11 refills | Status: DC

## 2018-08-02 MED ORDER — PROMETHAZINE HCL 12.5 MG PO TABS: 12.5000 mg | ORAL_TABLET | Freq: Four times a day (QID) | ORAL | 1 refills | Status: DC | PRN

## 2018-08-02 MED ORDER — ONDANSETRON 8 MG PO TBDP: 8.0000 mg | ORAL_TABLET | Freq: Two times a day (BID) | ORAL | 0 refills | Status: DC | PRN

## 2018-08-02 MED ORDER — CYCLOBENZAPRINE HCL 5 MG PO TABS: 5.0000 mg | ORAL_TABLET | Freq: Two times a day (BID) | ORAL | 0 refills | Status: DC | PRN

## 2018-08-02 NOTE — Patient Instructions (Signed)
Behavioral Health Resources:  ? ?What if I or someone I know is in crisis? ? ?If you are thinking about harming yourself or having thoughts of suicide, or if you know someone who is, seek help right away. ? ?Call your doctor or mental health care provider. ? ?Call 911 or go to a hospital emergency room to get immediate help, or ask a friend or family member to help you do these things. ? ?Call the [place] National Suicide Prevention Lifeline?s toll-free, 24-hour hotline at 1-800-273-TALK (1-800-273-8255) or TTY: 1-800-799-4 TTY (1-800-799-4889) to talk to a trained counselor. ? ?If you are in crisis, make sure you are not left alone.  ? ?If someone else is in crisis, make sure he or she is not left alone ? ? ?24 Hour Availability ? ?[facility]  ?[address], [place], [place] [address]  ?336-832-9700 or 1-800-711-2635 ? ?[facility] Crisis Line ?(Domestic Violence, Rape & Victim Assistance ?336-273-7273 ? ?[facility]  ?[address] [place], [place]  [address]               1-855-788-8787 or 336-676-6840 ? ?[facility] [place] [facility]    ?(ONLY from 8am-4pm)    ?336-899-1505 ? ?[facility] (24/7)   ?1-877-626-1772 ? ?[place] National Suicide Hotline   ?1-800-273-8255 (TALK) ? ?Support from local police to aid getting patient to hospital (http://www.[place].gov/index.aspx?page=2797) ? ? ?      ?ONGOING BEHAVIORAL HEALTH SUPPORT FOR UNINSURED and UNDERINSURED:  ?[facility]  ?336-676-6840  ?[address]  ?Walk-in first time, Monday-Friday, 8:30am-5:00pm  ?*Bring snack, drink, something to do, long wait at first visit, they do have pharmacy for behavioral health medications/ Bring own interpreter at first visit, if needed ?[facility]  ?336-387-6161  ?[address]  ?Walk-in Monday-Friday, 8:30am-12pm & 1-2:30pm  ?*pacientes que hablen espanol, favor comunicarse con el Sr.  [place], extension 2244 o la [address], extension 3331 para hacer una cita  ?[facility]:  ?336-429-5600 or [facility]ell[facility]@gmail.com  ?[address], [address]  ?Call or email, may self-refer  ?* uninsured/underinsured, 19-64yo, have both mental health and substance use challenges  ?[facility]:  ?Phone (336) [phone]; Fax [phone]  ?*Call to schedule an appointment  ?3rd Floor located @?[address], [address]  ?Mon-Thursday: 8:30am-8:00pm Friday: 8:30am-7:00pm  ?* Be sure to park in a space labeled ?Psychology Department,? located to the right of the main door of the building. Enter the main doors facing the parking lot and take the elevator or stairs to the 3rd Floor.  ?[facility]:  ?336-832-9700 or  ?1-800-711-2635 (24/hour helpline)  ?[address]  ?Call to make appointment, tends to be a long wait to begin services, depending on insurance  ?[facility]  ?(336) 333-6860 ??  ?*Call to schedule an appointment  ?[address]  ?Monday-Friday, 8:00am-5:00pm  ?[facility]  ?([phone]  ?[address], [place]  ?Monday-Friday, walk-in 8am-3pm  ?First appointment is assessment, then will make appointment for psychiatry   ?  ?

## 2018-08-02 NOTE — Progress Notes (Signed)
Assessment & Plan:  Phyllis Phillips was seen today for new patient (initial visit).  Diagnoses and all orders for this visit:  Severe episode of recurrent major depressive disorder, without psychotic features (HCC) -     Ambulatory referral to Psychiatry Behavioral Health/Crisis resources given to patient today.   Patient has been counseled on age-appropriate routine health concerns for screening and prevention. These are reviewed and up-to-date. Referrals have been placed accordingly. Immunizations are up-to-date or declined.    Subjective:   Chief Complaint  Patient presents with  . New Patient (Initial Visit)    Pt. is here to establish care for primary care and would like a referral for psychiatry.    HPI   Phyllis Phillips 27 y.o. female presents to office today to establish care. She has a history of depression and is requesting to see a psychiatrist to be placed on medication for mood lability.  She has medical care provided by a PCP in several years.  She is currently seeing a therapist/LCSW for her depression and was referred by her Obstetrician (EDD 01/26/2019). States she may need to stop seeing the therapist as she is trying to find a job and there may be schedule conflicts.  She has taken in the past: Depakote, abilify, prozac and "I've been on a bunch of shyt before". She denies bipolar disorder or schizophrenia. She has lots of stressors; unplanned and unwanted pregnancy, financial stressors and very little emotional support. She may be giving the child up for adoption. She denies any current suicidal ideation.  Depression screen [facility] 2/9 08/02/2018 08/02/2018 07/05/2018  Decreased Interest 3 3 3   Down, Depressed, Hopeless 3 3 3   PHQ - 2 Score 6 6 6   Altered sleeping 3 3 3   Tired, decreased energy 3 3 3   Change in appetite 3 3 3   Feeling bad or failure about yourself  3 3 3   Trouble concentrating 3 3 3   Moving slowly or fidgety/restless 0 0 1  Suicidal thoughts 0 0 0  PHQ-9 Score 21  21 22      GAD 7 : Generalized Anxiety Score 08/02/2018 08/02/2018 07/05/2018  Nervous, Anxious, on Edge 3 3 3   Control/stop worrying 3 3 3   Worry too much - different things 3 3 3   Trouble relaxing 3 3 3   Restless 3 3 3   Easily annoyed or irritable 3 3 3   Afraid - awful might happen 3 0 3  Total GAD 7 Score 21 18 21     Review of Systems  Constitutional: Negative for fever, malaise/fatigue and weight loss.  HENT: Negative.  Negative for nosebleeds.   Eyes: Negative.  Negative for blurred vision, double vision and photophobia.  Respiratory: Negative.  Negative for cough and shortness of breath.   Cardiovascular: Negative.  Negative for chest pain, palpitations and leg swelling.  Gastrointestinal: Negative.  Negative for heartburn, nausea and vomiting.  Musculoskeletal: Negative.  Negative for myalgias.  Neurological: Negative.  Negative for dizziness, focal weakness, seizures and headaches.  Psychiatric/Behavioral: Positive for depression. Negative for suicidal ideas. The patient is nervous/anxious.     Past Medical History:  Diagnosis Date  . Anemia   . Anxiety   . Asthma    last used a month ago  . Depression   . Infection    UTI  . Recurrent upper respiratory infection (URI)   . Shingles     Past Surgical History:  Procedure Laterality Date  . DILATION AND CURETTAGE OF UTERUS    . HAND  TENDON SURGERY    . WISDOM TOOTH EXTRACTION      Family History  Problem Relation Age of Onset  . Cancer Mother   . Early death Mother   . Arthritis Father   . Diabetes Father   . Hypertension Father   . Varicose Veins Father     Social History Reviewed with no changes to be made today.   Outpatient Medications Prior to Visit  Medication Sig Dispense Refill  . albuterol (PROVENTIL HFA;VENTOLIN HFA) 108 (90 Base) MCG/ACT inhaler Inhale 2 puffs into the lungs every 6 (six) hours as needed for wheezing or shortness of breath.    . cyclobenzaprine (FLEXERIL) 5 MG tablet Take 1  tablet (5 mg total) by mouth 2 (two) times daily as needed for muscle spasms. (Patient not taking: Reported on 08/02/2018) 20 tablet 0  . ondansetron (ZOFRAN ODT) 8 MG disintegrating tablet Take 1 tablet (8 mg total) by mouth 2 (two) times daily as needed for nausea or vomiting. (Patient not taking: Reported on 08/02/2018) 30 tablet 0  . Prenatal Vit-Fe Fumarate-FA (PRENATAL MULTIVITAMIN) TABS tablet Take 1 tablet by mouth daily at 12 noon. (Patient not taking: Reported on 08/02/2018) 30 tablet 0  . Prenatal Vit-Fe Phos-FA-Omega (VITAFOL GUMMIES) 3.33-0.333-34.8 MG CHEW Chew 3 each by mouth daily. (Patient not taking: Reported on 08/02/2018) 90 tablet 11  . promethazine (PHENERGAN) 12.5 MG tablet Take 1 tablet (12.5 mg total) by mouth every 6 (six) hours as needed for nausea or vomiting. (Patient not taking: Reported on 08/02/2018) 30 tablet 1   No facility-administered medications prior to visit.     Allergies  Allergen Reactions  . Latex Other (See Comments)    Causes burning.       Objective:    BP 128/75 (BP Location: Right Arm, Patient Position: Sitting, Cuff Size: Normal)   Pulse 75   Temp 98.7 F (37.1 C) (Oral)   Ht 5\' 6"  (1.676 m)   Wt 196 lb (88.9 kg)   LMP 05/11/2018   SpO2 98%   BMI 31.64 kg/m  Wt Readings from Last 3 Encounters:  08/02/18 196 lb (88.9 kg)  08/02/18 196 lb 4.8 oz (89 kg)  07/05/18 198 lb 4.8 oz (89.9 kg)    Physical Exam  Constitutional: She is oriented to person, place, and time. She appears well-developed and well-nourished. She is cooperative.  HENT:  Head: Normocephalic and atraumatic.  Eyes: EOM are normal.  Neck: Normal range of motion.  Cardiovascular: Normal rate, regular rhythm, normal heart sounds and intact distal pulses. Exam reveals no gallop and no friction rub.  No murmur heard. Pulmonary/Chest: Effort normal and breath sounds normal. No tachypnea. No respiratory distress. She has no decreased breath sounds. She has no wheezes. She has  no rhonchi. She has no rales. She exhibits no tenderness.  Abdominal: Soft. Bowel sounds are normal.  Musculoskeletal: Normal range of motion. She exhibits no edema.  Neurological: She is alert and oriented to person, place, and time. Coordination normal.  Skin: Skin is warm and dry.  Psychiatric: Her speech is normal and behavior is normal. Judgment and thought content normal. Her mood appears not anxious. Her affect is not angry and not inappropriate. Cognition and memory are normal.  Nursing note and vitals reviewed.      Patient has been counseled extensively about nutrition and exercise as well as the importance of adherence with medications and regular follow-up. The patient was given clear instructions to go to ER or return to  medical center if symptoms don't improve, worsen or new problems develop. The patient verbalized understanding.   Follow-up: Return if symptoms worsen or fail to improve.   Claiborne Rigg, FNP-BC [facility] [place], [place] [phone]   08/02/2018, 6:14 PM

## 2018-08-02 NOTE — Progress Notes (Signed)
    Subjective:  Phyllis Phillips is a 27 y.o. [id number] at [redacted]w[redacted]d being seen today for ongoing prenatal care.  She is currently monitored for the following issues for this low-risk pregnancy and has GBS bacteriuria and Supervision of other normal pregnancy, antepartum on their problem list.  Patient reports vomiting.  Contractions: Not present. Vag. Bleeding: None.  Movement: Absent. Denies leaking of fluid.   The following portions of the patient's history were reviewed and updated as appropriate: allergies, current medications, past family history, past medical history, past social history, past surgical history and problem list. Problem list updated.  Objective:   Vitals:   08/02/18 1131  BP: 107/66  Pulse: 81  Weight: 196 lb 4.8 oz (89 kg)    Fetal Status: Fetal Heart Rate (bpm): 146   Movement: Absent     General:  Alert, oriented and cooperative. Patient is in no acute distress.  Skin: Skin is warm and dry. No rash noted.   Cardiovascular: Normal heart rate noted  Respiratory: Normal respiratory effort, no problems with respiration noted  Abdomen: Soft, gravid, appropriate for gestational age. Pain/Pressure: Absent     Pelvic:  Cervical exam deferred        Extremities: Normal range of motion.  Edema: None  Mental Status: Normal mood and affect. Normal behavior. Normal judgment and thought content.   Urinalysis:      Assessment and Plan:  Pregnancy: [id number] at [redacted]w[redacted]d  1. Supervision of other normal pregnancy, antepartum Accompanied by the adoptive mother. Prescribed gummy vitamins for her. Wants tubes removed at there tubal - advised that MD would discuss with her. Having round ligament pain - wants Flexeril refilled as she has back pain from her work. Scheduled anatomy US today and redrew Panorama - previous specimen was not run due to a lab accident.  2. Nausea and vomiting during pregnancy prior to [redacted] weeks gestation Prescribed medications for her - Phenergan and zofran.   Client was vomiting in her visit today. Has not been taking her medications and reviewed again how to use her meds so they can relieve her vomiting.  Preterm labor symptoms and general obstetric precautions including but not limited to vaginal bleeding, contractions, leaking of fluid and fetal movement were reviewed in detail with the patient. Please refer to After Visit Summary for other counseling recommendations.  Return in about 4 weeks (around 08/30/2018). Did not make appointment - will have clinic contact her for an appointment.  Nolene Bernheim, RN, MSN, NP-BC Nurse Practitioner, [facility] for [employer], [facility] 08/02/2018 11:47 AM

## 2018-08-02 NOTE — Telephone Encounter (Signed)
LVM advising pt of her appointment on 12/4 @ 3:35pm

## 2018-08-02 NOTE — Patient Instructions (Addendum)
Round Ligament Pain The round ligament is a cord of muscle and tissue that helps to support the uterus. It can become a source of pain during pregnancy if it becomes stretched or twisted as the baby grows. The pain usually begins in the second trimester of pregnancy, and it can come and go until the baby is delivered. It is not a serious problem, and it does not cause harm to the baby. Round ligament pain is usually a short, sharp, and pinching pain, but it can also be a dull, lingering, and aching pain. The pain is felt in the lower side of the abdomen or in the groin. It usually starts deep in the groin and moves up to the outside of the hip area. Pain can occur with:  A sudden change in position.  Rolling over in bed.  Coughing or sneezing.  Physical activity.  Follow these instructions at home: Watch your condition for any changes. Take these steps to help with your pain:  When the pain starts, relax. Then try: ? Sitting down. ? Flexing your knees up to your abdomen. ? Lying on your side with one pillow under your abdomen and another pillow between your legs. ? Sitting in a warm bath for 15-20 minutes or until the pain goes away.  Take over-the-counter and prescription medicines only as told by your health care provider.  Move slowly when you sit and stand.  Avoid long walks if they cause pain.  Stop or lessen your physical activities if they cause pain.  Contact a health care provider if:  Your pain does not go away with treatment.  You feel pain in your back that you did not have before.  Your medicine is not helping. Get help right away if:  You develop a fever or chills.  You develop uterine contractions.  You develop vaginal bleeding.  You develop nausea or vomiting.  You develop diarrhea.  You have pain when you urinate. This information is not intended to replace advice given to you by your health care provider. Make sure you discuss any questions you have  with your health care provider. Document Released: 06/22/2008 Document Revised: 02/19/2016 Document Reviewed: 11/20/2014 Elsevier Interactive Patient Education  2018 [employer].  Morning Sickness Morning sickness is when you feel sick to your stomach (nauseous) during pregnancy. You may feel sick to your stomach and throw up (vomit). You may feel sick in the morning, but you can feel this way any time of day. Some women feel very sick to their stomach and cannot stop throwing up (hyperemesis gravidarum). Follow these instructions at home:  Only take medicines as told by your doctor.  Take multivitamins as told by your doctor. Taking multivitamins before getting pregnant can stop or lessen the harshness of morning sickness.  Eat dry toast or unsalted crackers before getting out of bed.  Eat 5 to 6 small meals a day.  Eat dry and bland foods like rice and baked potatoes.  Do not drink liquids with meals. Drink between meals.  Do not eat greasy, fatty, or spicy foods.  Have someone cook for you if the smell of food causes you to feel sick or throw up.  If you feel sick to your stomach after taking prenatal vitamins, take them at night or with a snack.  Eat protein when you need a snack (nuts, yogurt, cheese).  Eat unsweetened gelatins for dessert.  Wear a bracelet used for sea sickness (acupressure wristband).  Go to a doctor  that puts thin needles into certain body points (acupuncture) to improve how you feel.  Do not smoke.  Use a humidifier to keep the air in your house free of odors.  Get lots of fresh air. Contact a doctor if:  You need medicine to feel better.  You feel dizzy or lightheaded.  You are losing weight. Get help right away if:  You feel very sick to your stomach and cannot stop throwing up.  You pass out (faint). This information is not intended to replace advice given to you by your health care provider. Make sure you discuss any questions you  have with your health care provider. Document Released: 10/21/2004 Document Revised: 02/19/2016 Document Reviewed: 02/28/2013 Elsevier Interactive Patient Education  2017 [employer].

## 2018-08-03 ENCOUNTER — Encounter: Payer: Self-pay | Admitting: *Deleted

## 2018-08-14 ENCOUNTER — Encounter: Payer: Self-pay | Admitting: *Deleted

## 2018-08-23 ENCOUNTER — Encounter (HOSPITAL_COMMUNITY): Payer: Self-pay

## 2018-08-30 ENCOUNTER — Encounter: Payer: Self-pay | Admitting: Nurse Practitioner

## 2018-09-01 ENCOUNTER — Ambulatory Visit (HOSPITAL_COMMUNITY): Payer: Medicaid Other | Attending: Advanced Practice Midwife

## 2018-09-04 ENCOUNTER — Encounter: Payer: Self-pay | Admitting: Advanced Practice Midwife

## 2018-09-06 ENCOUNTER — Ambulatory Visit (INDEPENDENT_AMBULATORY_CARE_PROVIDER_SITE_OTHER): Payer: Medicaid Other | Admitting: Nurse Practitioner

## 2018-09-06 ENCOUNTER — Inpatient Hospital Stay (HOSPITAL_COMMUNITY)
Admission: AD | Admit: 2018-09-06 | Discharge: 2018-09-06 | Disposition: A | Discharge status: Home / Self Care | Payer: Medicaid Other | Attending: Obstetrics & Gynecology | Admitting: Obstetrics & Gynecology

## 2018-09-06 ENCOUNTER — Encounter (HOSPITAL_COMMUNITY): Payer: Self-pay

## 2018-09-06 ENCOUNTER — Encounter: Payer: Self-pay | Admitting: Family Medicine

## 2018-09-06 ENCOUNTER — Other Ambulatory Visit: Payer: Self-pay

## 2018-09-06 VITALS — BP 104/68 | HR 89 | Wt 200.1 lb

## 2018-09-06 DIAGNOSIS — R109 Unspecified abdominal pain: Secondary | ICD-10-CM | POA: Diagnosis not present

## 2018-09-06 DIAGNOSIS — Z3480 Encounter for supervision of other normal pregnancy, unspecified trimester: Secondary | ICD-10-CM

## 2018-09-06 DIAGNOSIS — O26892 Other specified pregnancy related conditions, second trimester: Secondary | ICD-10-CM | POA: Diagnosis not present

## 2018-09-06 DIAGNOSIS — Z3482 Encounter for supervision of other normal pregnancy, second trimester: Secondary | ICD-10-CM

## 2018-09-06 DIAGNOSIS — Z3A19 19 weeks gestation of pregnancy: Secondary | ICD-10-CM | POA: Diagnosis not present

## 2018-09-06 DIAGNOSIS — Z87891 Personal history of nicotine dependence: Secondary | ICD-10-CM | POA: Diagnosis not present

## 2018-09-06 DIAGNOSIS — R1030 Lower abdominal pain, unspecified: Secondary | ICD-10-CM | POA: Diagnosis not present

## 2018-09-06 DIAGNOSIS — O26899 Other specified pregnancy related conditions, unspecified trimester: Secondary | ICD-10-CM

## 2018-09-06 DIAGNOSIS — O368120 Decreased fetal movements, second trimester, not applicable or unspecified: Secondary | ICD-10-CM | POA: Insufficient documentation

## 2018-09-06 LAB — WET PREP, GENITAL
Clue Cells Wet Prep HPF POC: NONE SEEN
Sperm: NONE SEEN
Trich, Wet Prep: NONE SEEN
Yeast Wet Prep HPF POC: NONE SEEN

## 2018-09-06 LAB — URINALYSIS, ROUTINE W REFLEX MICROSCOPIC
Bilirubin Urine: NEGATIVE
Glucose, UA: NEGATIVE mg/dL
Hgb urine dipstick: NEGATIVE
Ketones, ur: NEGATIVE mg/dL
Leukocytes, UA: NEGATIVE
Nitrite: NEGATIVE
Protein, ur: NEGATIVE mg/dL
Specific Gravity, Urine: 1.011 (ref 1.005–1.030)
pH: 8 (ref 5.0–8.0)

## 2018-09-06 NOTE — Discharge Instructions (Signed)

## 2018-09-06 NOTE — MAU Note (Signed)
Presents with c/o decreased FM since Monday.  Denies VB, reports abdominal cramping.

## 2018-09-06 NOTE — Progress Notes (Signed)
    Subjective:  Phyllis Phillips is a 27 y.o. [id number] at 5841w5d being seen today for ongoing prenatal care.  She is currently monitored for the following issues for this low-risk pregnancy and has GBS bacteriuria and Supervision of other normal pregnancy, antepartum on their problem list.  Patient reports abdominal pain and seen for evaluation this morning in MAU.  MAU note reviewed.  Client states cart rolled into her abdomen on Monday.  Has had periodic pain since then bilaterally in lower abdomen. Cervix was long and closed - is not having vaginal bleeding.  Contractions: Not present. Vag. Bleeding: None.  Movement: Present. Denies leaking of fluid.   The following portions of the patient's history were reviewed and updated as appropriate: allergies, current medications, past family history, past medical history, past social history, past surgical history and problem list. Problem list updated.  Objective:   Vitals:   09/06/18 1426  BP: 104/68  Pulse: 89  Weight: 200 lb 1.6 oz (90.8 kg)    Fetal Status: Fetal Heart Rate (bpm): 146 Fundal Height: 23 cm Movement: Present     General:  Alert, oriented and cooperative. Patient is in no acute distress.  Skin: Skin is warm and dry. No rash noted.   Cardiovascular: Normal heart rate noted  Respiratory: Normal respiratory effort, no problems with respiration noted  Abdomen: Soft, gravid, appropriate for gestational age. Pain/Pressure: Present     Pelvic:  Cervical exam deferred        Extremities: Normal range of motion.  Edema: None  Mental Status: Normal mood and affect. Normal behavior. Normal judgment and thought content.   Urinalysis:      Assessment and Plan:  Pregnancy: [id number] at 841w5d  1. Supervision of other normal pregnancy, antepartum Will schedule previously ordered ultrasound   Abdominal pain  In MAU this morning for evaluation.  Pain still continues but no vaginal bleeding or leaking.  Client reassured and advised this  may be round ligament pain.  Advised to call the office if the pain is worsening or if she has fever, nausea or vomiting.  Labs from this morning pending.  Preterm labor symptoms and general obstetric precautions including but not limited to vaginal bleeding, contractions, leaking of fluid and fetal movement were reviewed in detail with the patient. Please refer to After Visit Summary for other counseling recommendations.  Return in about 4 weeks (around 10/04/2018).  Nolene BernheimERRI Marysue Fait, RN, MSN, NP-BC Nurse Practitioner, [facility], [facility] 09/06/2018 2:47 PM

## 2018-09-06 NOTE — Patient Instructions (Addendum)
Take Tylenol 325 mg 2 tablets by mouth every 4 hours if needed for pain. Drink at least 8 8-oz glasses of water every day.   Round Ligament Pain The round ligament is a cord of muscle and tissue that helps to support the uterus. It can become a source of pain during pregnancy if it becomes stretched or twisted as the baby grows. The pain usually begins in the second trimester of pregnancy, and it can come and go until the baby is delivered. It is not a serious problem, and it does not cause harm to the baby. Round ligament pain is usually a short, sharp, and pinching pain, but it can also be a dull, lingering, and aching pain. The pain is felt in the lower side of the abdomen or in the groin. It usually starts deep in the groin and moves up to the outside of the hip area. Pain can occur with:  A sudden change in position.  Rolling over in bed.  Coughing or sneezing.  Physical activity.  Follow these instructions at home: Watch your condition for any changes. Take these steps to help with your pain:  When the pain starts, relax. Then try: ? Sitting down. ? Flexing your knees up to your abdomen. ? Lying on your side with one pillow under your abdomen and another pillow between your legs. ? Sitting in a warm bath for 15-20 minutes or until the pain goes away.  Take over-the-counter and prescription medicines only as told by your health care provider.  Move slowly when you sit and stand.  Avoid long walks if they cause pain.  Stop or lessen your physical activities if they cause pain.  Contact a health care provider if:  Your pain does not go away with treatment.  You feel pain in your back that you did not have before.  Your medicine is not helping. Get help right away if:  You develop a fever or chills.  You develop uterine contractions.  You develop vaginal bleeding.  You develop nausea or vomiting.  You develop diarrhea.  You have pain when you urinate. This  information is not intended to replace advice given to you by your health care provider. Make sure you discuss any questions you have with your health care provider. Document Released: 06/22/2008 Document Revised: 02/19/2016 Document Reviewed: 11/20/2014 Elsevier Interactive Patient Education  [employer]2018 Elsevier Inc.

## 2018-09-06 NOTE — MAU Provider Note (Signed)
History     CSN: [id number]  Arrival date and time: 09/06/18 7829   First Provider Initiated Contact with Patient 09/06/18 [phone]      Chief Complaint  Patient presents with  . Decreased Fetal Movement   [id number] @19 .5 wks here with decreased FM. Reports no FM since last night. Reports bumping her abdomen on a metal cart 2 days ago at work. Having low abdominal cramping since. Soaked in warm bath for relief. Denies VB and LOF. No urinary sx. Hx of morning sickness, uses Zofran and Phenergan.  OB History    Gravida  6   Para  2   Term  2   Preterm      AB  3   Living  2     SAB  1   TAB  2   Ectopic      Multiple  0   Live Births  2           Past Medical History:  Diagnosis Date  . Anemia   . Anxiety   . Asthma    last used a month ago  . Depression   . Infection    UTI  . Recurrent upper respiratory infection (URI)   . Shingles     Past Surgical History:  Procedure Laterality Date  . DILATION AND CURETTAGE OF UTERUS    . HAND TENDON SURGERY    . WISDOM TOOTH EXTRACTION      Family History  Problem Relation Age of Onset  . Cancer Mother   . Early death Mother   . Arthritis Father   . Diabetes Father   . Hypertension Father   . Varicose Veins Father     Social History   Tobacco Use  . Smoking status: Former Games developer  . Smokeless tobacco: Never Used  Substance Use Topics  . Alcohol use: Yes    Comment: rarely  . Drug use: Not Currently    Types: Marijuana    Comment: "long time ago"    Allergies:  Allergies  Allergen Reactions  . Latex Other (See Comments)    Causes burning.    Medications Prior to Admission  Medication Sig Dispense Refill Last Dose  . albuterol (PROVENTIL HFA;VENTOLIN HFA) 108 (90 Base) MCG/ACT inhaler Inhale 2 puffs into the lungs every 6 (six) hours as needed for wheezing or shortness of breath.   Not Taking  . cyclobenzaprine (FLEXERIL) 5 MG tablet Take 1 tablet (5 mg total) by mouth 2 (two) times daily  as needed for muscle spasms. (Patient not taking: Reported on 08/02/2018) 20 tablet 0 Not Taking  . ondansetron (ZOFRAN ODT) 8 MG disintegrating tablet Take 1 tablet (8 mg total) by mouth 2 (two) times daily as needed for nausea or vomiting. (Patient not taking: Reported on 08/02/2018) 30 tablet 0 Not Taking  . Prenatal Vit-Fe Fumarate-FA (PRENATAL MULTIVITAMIN) TABS tablet Take 1 tablet by mouth daily at 12 noon. (Patient not taking: Reported on 08/02/2018) 30 tablet 0 Not Taking  . Prenatal Vit-Fe Phos-FA-Omega (VITAFOL GUMMIES) 3.33-0.333-34.8 MG CHEW Chew 3 each by mouth daily. (Patient not taking: Reported on 08/02/2018) 90 tablet 11 Not Taking  . promethazine (PHENERGAN) 12.5 MG tablet Take 1 tablet (12.5 mg total) by mouth every 6 (six) hours as needed for nausea or vomiting. (Patient not taking: Reported on 08/02/2018) 30 tablet 1 Not Taking    Review of Systems  Gastrointestinal: Positive for abdominal pain, nausea and vomiting.  Genitourinary: Negative for dysuria, hematuria, urgency,  vaginal bleeding and vaginal discharge.  Musculoskeletal: Positive for back pain.   Physical Exam   Blood pressure 110/62, pulse 79, temperature 98.1 F (36.7 C), temperature source Oral, resp. rate 20, height 5\' 6"  (1.676 m), weight 89.8 kg, last menstrual period 05/11/2018, SpO2 100 %, unknown if currently breastfeeding.  Physical Exam  Constitutional: She is oriented to person, place, and time. She appears well-developed and well-nourished. No distress.  HENT:  Head: Normocephalic and atraumatic.  Neck: Normal range of motion.  Cardiovascular: Normal rate.  Respiratory: Effort normal. No respiratory distress.  GI: Soft. She exhibits no distension and no mass. There is tenderness in the suprapubic area. There is no rebound and no guarding.  Genitourinary:  Genitourinary Comments: SVE closed/long  Musculoskeletal: Normal range of motion.  Neurological: She is alert and oriented to person, place, and  time.  Skin: Skin is warm and dry.  Psychiatric: She has a normal mood and affect.  FHT 140  Results for orders placed or performed during the hospital encounter of 09/06/18 (from the past 24 hour(s))  Urinalysis, Routine w reflex microscopic     Status: None   Collection Time: 09/06/18  9:11 AM  Result Value Ref Range   Color, Urine YELLOW YELLOW   APPearance CLEAR CLEAR   Specific Gravity, Urine 1.011 1.005 - 1.030   pH 8.0 5.0 - 8.0   Glucose, UA NEGATIVE NEGATIVE mg/dL   Hgb urine dipstick NEGATIVE NEGATIVE   Bilirubin Urine NEGATIVE NEGATIVE   Ketones, ur NEGATIVE NEGATIVE mg/dL   Protein, ur NEGATIVE NEGATIVE mg/dL   Nitrite NEGATIVE NEGATIVE   Leukocytes, UA NEGATIVE NEGATIVE  Wet prep, genital     Status: Abnormal   Collection Time: 09/06/18  9:13 AM  Result Value Ref Range   Yeast Wet Prep HPF POC NONE SEEN NONE SEEN   Trich, Wet Prep NONE SEEN NONE SEEN   Clue Cells Wet Prep HPF POC NONE SEEN NONE SEEN   WBC, Wet Prep HPF POC FEW (A) NONE SEEN   Sperm NONE SEEN    MAU Course  Procedures  MDM Labs ordered and reviewed. No evidence of pelvic infection, UTI, or threatened SAB. Pain likely physiologic or MSK. Recommend heat and Tylenol. Stable for discharge home.   Assessment and Plan   1. [redacted] weeks gestation of pregnancy   2. Supervision of other normal pregnancy, antepartum   3. Abdominal cramping affecting pregnancy   4. Decreased fetal movements in second trimester, single or unspecified fetus    Discharge home Follow up in [facility] today for prenatal visit SAB precautions  Allergies as of 09/06/2018      Reactions   Latex Other (See Comments)   Causes burning.      Medication List    TAKE these medications   albuterol 108 (90 Base) MCG/ACT inhaler Commonly known as:  PROVENTIL HFA;VENTOLIN HFA Inhale 2 puffs into the lungs every 6 (six) hours as needed for wheezing or shortness of breath.   cyclobenzaprine 5 MG tablet Commonly known as:  FLEXERIL Take  1 tablet (5 mg total) by mouth 2 (two) times daily as needed for muscle spasms.   ondansetron 8 MG disintegrating tablet Commonly known as:  ZOFRAN-ODT Take 1 tablet (8 mg total) by mouth 2 (two) times daily as needed for nausea or vomiting.   prenatal multivitamin Tabs tablet Take 1 tablet by mouth daily at 12 noon.   promethazine 12.5 MG tablet Commonly known as:  PHENERGAN Take 1 tablet (12.5 mg  total) by mouth every 6 (six) hours as needed for nausea or vomiting.   VITAFOL GUMMIES 3.33-0.333-34.8 MG Chew Chew 3 each by mouth daily.      Donette LarryMelanie Areg Bialas, CNM 09/06/2018, 10:00 AM

## 2018-09-07 LAB — GC/CHLAMYDIA PROBE AMP (~~LOC~~) NOT AT ARMC
Chlamydia: NEGATIVE
Neisseria Gonorrhea: NEGATIVE

## 2018-09-14 ENCOUNTER — Ambulatory Visit (HOSPITAL_COMMUNITY): Payer: Medicaid Other | Attending: Advanced Practice Midwife

## 2018-10-04 ENCOUNTER — Ambulatory Visit (INDEPENDENT_AMBULATORY_CARE_PROVIDER_SITE_OTHER): Payer: Medicaid Other | Admitting: Medical

## 2018-10-04 ENCOUNTER — Encounter: Payer: Self-pay | Admitting: Medical

## 2018-10-04 VITALS — BP 117/73 | HR 79 | Wt 200.0 lb

## 2018-10-04 DIAGNOSIS — N949 Unspecified condition associated with female genital organs and menstrual cycle: Secondary | ICD-10-CM

## 2018-10-04 DIAGNOSIS — Z3A23 23 weeks gestation of pregnancy: Secondary | ICD-10-CM

## 2018-10-04 DIAGNOSIS — R8271 Bacteriuria: Secondary | ICD-10-CM

## 2018-10-04 DIAGNOSIS — O219 Vomiting of pregnancy, unspecified: Secondary | ICD-10-CM

## 2018-10-04 DIAGNOSIS — Z3480 Encounter for supervision of other normal pregnancy, unspecified trimester: Secondary | ICD-10-CM

## 2018-10-04 DIAGNOSIS — Z3482 Encounter for supervision of other normal pregnancy, second trimester: Secondary | ICD-10-CM

## 2018-10-04 MED ORDER — CYCLOBENZAPRINE HCL 5 MG PO TABS: 5.0000 mg | ORAL_TABLET | Freq: Two times a day (BID) | ORAL | 0 refills | Status: DC | PRN

## 2018-10-04 MED ORDER — ONDANSETRON 8 MG PO TBDP: 8.0000 mg | ORAL_TABLET | Freq: Two times a day (BID) | ORAL | 0 refills | Status: DC | PRN

## 2018-10-04 MED ORDER — ENSURE ENLIVE PO LIQD: 1.0000 | Freq: Two times a day (BID) | ORAL | 15 refills | Status: DC

## 2018-10-04 NOTE — Patient Instructions (Signed)
Hyperemesis Gravidarum Hyperemesis gravidarum is a severe form of nausea and vomiting that happens during pregnancy. Hyperemesis is worse than morning sickness. It may cause you to have nausea or vomiting all day for many days. It may keep you from eating and drinking enough food and liquids, which can lead to dehydration, malnutrition, and weight loss. Hyperemesis usually occurs during the first half (the first 20 weeks) of pregnancy. It often goes away once a woman is in her second half of pregnancy. However, sometimes hyperemesis continues through an entire pregnancy. What are the causes? The cause of this condition is not known. It may be related to changes in chemicals (hormones) in the body during pregnancy, such as the high level of pregnancy hormone (human chorionic gonadotropin) or the increase in the female sex hormone (estrogen). What are the signs or symptoms? Symptoms of this condition include:  Nausea that does not go away.  Vomiting that does not allow you to keep any food down.  Weight loss.  Body fluid loss (dehydration).  Having no desire to eat, or not liking food that you have previously enjoyed. How is this diagnosed? This condition may be diagnosed based on:  A physical exam.  Your medical history.  Your symptoms.  Blood tests.  Urine tests. How is this treated? This condition is managed by controlling symptoms. This may include:  Following an eating plan. This can help lessen nausea and vomiting.  Taking prescription medicines. An eating plan and medicines are often used together to help control symptoms. If medicines do not help relieve nausea and vomiting, you may need to receive fluids through an IV at the hospital. Follow these instructions at home: Eating and drinking   Avoid the following: ? Drinking fluids with meals. Try not to drink anything during the 30 minutes before and after your meals. ? Drinking more than 1 cup of fluid at a  time. ? Eating foods that trigger your symptoms. These may include spicy foods, coffee, high-fat foods, very sweet foods, and acidic foods. ? Skipping meals. Nausea can be more intense on an empty stomach. If you cannot tolerate food, do not force it. Try sucking on ice chips or other frozen items and make up for missed calories later. ? Lying down within 2 hours after eating. ? Being exposed to environmental triggers. These may include food smells, smoky rooms, closed spaces, rooms with strong smells, warm or humid places, overly loud and noisy rooms, and rooms with motion or flickering lights. Try eating meals in a well-ventilated area that is free of strong smells. ? Quick and sudden changes in your movement. ? Taking iron pills and multivitamins that contain iron. If you take prescription iron pills, do not stop taking them unless your health care provider approves. ? Preparing food. The smell of food can spoil your appetite or trigger nausea.  To help relieve your symptoms: ? Listen to your body. Everyone is different and has different preferences. Find what works best for you. ? Eat and drink slowly. ? Eat 5-6 small meals daily instead of 3 large meals. Eating small meals and snacks can help you avoid an empty stomach. ? In the morning, before getting out of bed, eat a couple of crackers to avoid moving around on an empty stomach. ? Try eating starchy foods as these are usually tolerated well. Examples include cereal, toast, bread, potatoes, pasta, rice, and pretzels. ? Include at least 1 serving of protein with your meals and snacks. Protein options include   lean meats, poultry, seafood, beans, nuts, nut butters, eggs, cheese, and yogurt. ? Try eating a protein-rich snack before bed. Examples of a protein-rick snack include cheese and crackers or a peanut butter sandwich made with 1 slice of whole-wheat bread and 1 tsp (5 g) of peanut butter. ? Eat or suck on things that have ginger in them.  It may help relieve nausea. Add  tsp ground ginger to hot tea or choose ginger tea. ? Try drinking 100% fruit juice or an electrolyte drink. An electrolyte drink contains sodium, potassium, and chloride. ? Drink fluids that are cold, clear, and carbonated or sour. Examples include lemonade, ginger ale, lemon-lime soda, ice water, and sparkling water. ? Brush your teeth or use a mouth rinse after meals. ? Talk with your health care provider about starting a supplement of vitamin B6. General instructions  Take over-the-counter and prescription medicines only as told by your health care provider.  Follow instructions from your health care provider about eating or drinking restrictions.  Continue to take your prenatal vitamins as told by your health care provider. If you are having trouble taking your prenatal vitamins, talk with your health care provider about different options.  Keep all follow-up and pre-birth (prenatal) visits as told by your health care provider. This is important. Contact a health care provider if:  You have pain in your abdomen.  You have a severe headache.  You have vision problems.  You are losing weight.  You feel weak or dizzy. Get help right away if:  You cannot drink fluids without vomiting.  You vomit blood.  You have constant nausea and vomiting.  You are very weak.  You faint.  You have a fever and your symptoms suddenly get worse. Summary  Hyperemesis gravidarum is a severe form of nausea and vomiting that happens during pregnancy.  Making some changes to your eating habits may help relieve nausea and vomiting.  This condition may be managed with medicine.  If medicines do not help relieve nausea and vomiting, you may need to receive fluids through an IV at the hospital. This information is not intended to replace advice given to you by your health care provider. Make sure you discuss any questions you have with your health care  provider. Document Released: 09/13/2005 Document Revised: 10/03/2017 Document Reviewed: 05/12/2016 Elsevier Interactive Patient Education  2019 [employer] Food Choices for Gastroesophageal Reflux Disease, Adult When you have gastroesophageal reflux disease (GERD), the foods you eat and your eating habits are very important. Choosing the right foods can help ease your discomfort. Think about working with a nutrition specialist (dietitian) to help you make good choices. What are tips for following this plan?  Meals  Choose healthy foods that are low in fat, such as fruits, vegetables, whole grains, low-fat dairy products, and lean meat, fish, and poultry.  Eat small meals often instead of 3 large meals a day. Eat your meals slowly, and in a place where you are relaxed. Avoid bending over or lying down until 2-3 hours after eating.  Avoid eating meals 2-3 hours before bed.  Avoid drinking a lot of liquid with meals.  Cook foods using methods other than frying. Bake, grill, or broil food instead.  Avoid or limit: ? Chocolate. ? Peppermint or spearmint. ? Alcohol. ? Pepper. ? Black and decaffeinated coffee. ? Black and decaffeinated tea. ? Bubbly (carbonated) soft drinks. ? Caffeinated energy drinks and soft drinks.  Limit high-fat foods such as: ? Fatty meat or  fried foods. ? Whole milk, cream, butter, or ice cream. ? Nuts and nut butters. ? Pastries, donuts, and sweets made with butter or shortening.  Avoid foods that cause symptoms. These foods may be different for everyone. Common foods that cause symptoms include: ? Tomatoes. ? Oranges, lemons, and limes. ? Peppers. ? Spicy food. ? Onions and garlic. ? Vinegar. Lifestyle  Maintain a healthy weight. Ask your doctor what weight is healthy for you. If you need to lose weight, work with your doctor to do so safely.  Exercise for at least 30 minutes for 5 or more days each week, or as told by your doctor.  Wear  loose-fitting clothes.  Do not smoke. If you need help quitting, ask your doctor.  Sleep with the head of your bed higher than your feet. Use a wedge under the mattress or blocks under the bed frame to raise the head of the bed. Summary  When you have gastroesophageal reflux disease (GERD), food and lifestyle choices are very important in easing your symptoms.  Eat small meals often instead of 3 large meals a day. Eat your meals slowly, and in a place where you are relaxed.  Limit high-fat foods such as fatty meat or fried foods.  Avoid bending over or lying down until 2-3 hours after eating.  Avoid peppermint and spearmint, caffeine, alcohol, and chocolate. This information is not intended to replace advice given to you by your health care provider. Make sure you discuss any questions you have with your health care provider. Document Released: 03/14/2012 Document Revised: 10/19/2016 Document Reviewed: 10/19/2016 Elsevier Interactive Patient Education  2019 [employer].

## 2018-10-04 NOTE — Progress Notes (Signed)
   PRENATAL VISIT NOTE  Subjective:  Phyllis Phillips is a 28 y.o. [id number] at [redacted]w[redacted]d being seen today for ongoing prenatal care.  She is currently monitored for the following issues for this low-risk pregnancy and has GBS bacteriuria and Supervision of other normal pregnancy, antepartum on their problem list.  Patient reports no complaints.  Contractions: Not present. Vag. Bleeding: None.  Movement: Present. Denies leaking of fluid.   The following portions of the patient's history were reviewed and updated as appropriate: allergies, current medications, past family history, past medical history, past social history, past surgical history and problem list. Problem list updated.  Objective:   Vitals:   10/04/18 1456  BP: 117/73  Pulse: 79  Weight: 200 lb (90.7 kg)    Fetal Status: Fetal Heart Rate (bpm): 150 Fundal Height: 24 cm Movement: Present     General:  Alert, oriented and cooperative. Patient is in no acute distress.  Skin: Skin is warm and dry. No rash noted.   Cardiovascular: Normal heart rate noted  Respiratory: Normal respiratory effort, no problems with respiration noted  Abdomen: Soft, gravid, appropriate for gestational age.  Pain/Pressure: Present     Pelvic: Cervical exam deferred        Extremities: Normal range of motion.  Edema: None  Mental Status: Normal mood and affect. Normal behavior. Normal judgment and thought content.   Assessment and Plan:  Pregnancy: [id number] at [redacted]w[redacted]d  1. Supervision of other normal pregnancy, antepartum - AFP, Serum, Open Spina Bifida  2. GBS bacteriuria - Treat in labor  3. Round ligament pain - cyclobenzaprine (FLEXERIL) 5 MG tablet; Take 1 tablet (5 mg total) by mouth 2 (two) times daily as needed for muscle spasms.  Dispense: 20 tablet; Refill: 0  4. Nausea and vomiting in pregnancy - ondansetron (ZOFRAN ODT) 8 MG disintegrating tablet; Take 1 tablet (8 mg total) by mouth 2 (two) times daily as needed for nausea or vomiting.   Dispense: 30 tablet; Refill: 0 - feeding supplement, ENSURE ENLIVE, (ENSURE ENLIVE) LIQD; Take 237 mLs by mouth 2 (two) times daily between meals.  Dispense: 237 mL; Refill: 15 - Dietary changes encouraged and information included on AVS  Preterm labor symptoms and general obstetric precautions including but not limited to vaginal bleeding, contractions, leaking of fluid and fetal movement were reviewed in detail with the patient. Please refer to After Visit Summary for other counseling recommendations.  Return in about 4 weeks (around 11/01/2018) for LOB, 28 week labs (fasting).  Vonzella Nipple, PA-C

## 2018-10-06 LAB — AFP, SERUM, OPEN SPINA BIFIDA
AFP MoM: 1.16
AFP Value: 97.3 ng/mL
Gest. Age on Collection Date: 23.6 weeks
Maternal Age At EDD: 28.9 yr
OSBR Risk 1 IN: 10000
Test Results:: NEGATIVE
Weight: 200 [lb_av]

## 2018-10-21 ENCOUNTER — Inpatient Hospital Stay (HOSPITAL_COMMUNITY): Payer: Medicaid Other

## 2018-10-21 ENCOUNTER — Inpatient Hospital Stay (HOSPITAL_COMMUNITY)
Admission: AD | Admit: 2018-10-21 | Discharge: 2018-10-22 | Disposition: A | Discharge status: Home / Self Care | Payer: Medicaid Other | Source: Ambulatory Visit | Attending: Obstetrics & Gynecology | Admitting: Obstetrics & Gynecology

## 2018-10-21 ENCOUNTER — Encounter (HOSPITAL_COMMUNITY): Payer: Self-pay | Admitting: *Deleted

## 2018-10-21 DIAGNOSIS — Z87891 Personal history of nicotine dependence: Secondary | ICD-10-CM | POA: Diagnosis not present

## 2018-10-21 DIAGNOSIS — R1010 Upper abdominal pain, unspecified: Secondary | ICD-10-CM | POA: Diagnosis not present

## 2018-10-21 DIAGNOSIS — O26892 Other specified pregnancy related conditions, second trimester: Secondary | ICD-10-CM | POA: Insufficient documentation

## 2018-10-21 DIAGNOSIS — O212 Late vomiting of pregnancy: Secondary | ICD-10-CM | POA: Insufficient documentation

## 2018-10-21 DIAGNOSIS — O2613 Low weight gain in pregnancy, third trimester: Secondary | ICD-10-CM

## 2018-10-21 DIAGNOSIS — Z3A26 26 weeks gestation of pregnancy: Secondary | ICD-10-CM | POA: Insufficient documentation

## 2018-10-21 DIAGNOSIS — O219 Vomiting of pregnancy, unspecified: Secondary | ICD-10-CM

## 2018-10-21 DIAGNOSIS — R1011 Right upper quadrant pain: Secondary | ICD-10-CM | POA: Diagnosis not present

## 2018-10-21 DIAGNOSIS — Z3480 Encounter for supervision of other normal pregnancy, unspecified trimester: Secondary | ICD-10-CM

## 2018-10-21 DIAGNOSIS — O2610 Low weight gain in pregnancy, unspecified trimester: Secondary | ICD-10-CM

## 2018-10-21 LAB — COMPREHENSIVE METABOLIC PANEL
ALT: 9 U/L (ref 0–44)
AST: 23 U/L (ref 15–41)
Albumin: 3.1 g/dL — ABNORMAL LOW (ref 3.5–5.0)
Alkaline Phosphatase: 75 U/L (ref 38–126)
Anion gap: 9 (ref 5–15)
BUN: 9 mg/dL (ref 6–20)
CO2: 19 mmol/L — ABNORMAL LOW (ref 22–32)
CREATININE: 0.49 mg/dL (ref 0.44–1.00)
Calcium: 8.7 mg/dL — ABNORMAL LOW (ref 8.9–10.3)
Chloride: 108 mmol/L (ref 98–111)
GFR calc Af Amer: >60 mL/min (ref >60)
GFR calc non Af Amer: >60 mL/min (ref >60)
Glucose, Bld: 114 mg/dL — ABNORMAL HIGH (ref 70–99)
Potassium: 3.9 mmol/L (ref 3.5–5.1)
Sodium: 136 mmol/L (ref 135–145)
Total Bilirubin: 0.6 mg/dL (ref 0.3–1.2)
Total Protein: 6.5 g/dL (ref 6.5–8.1)

## 2018-10-21 LAB — URINALYSIS, ROUTINE W REFLEX MICROSCOPIC
Bacteria, UA: NONE SEEN
Bilirubin Urine: NEGATIVE
Glucose, UA: NEGATIVE mg/dL
Hgb urine dipstick: NEGATIVE
Ketones, ur: 80 mg/dL — AB
Nitrite: NEGATIVE
Protein, ur: 30 mg/dL — AB
Specific Gravity, Urine: 1.027 (ref 1.005–1.030)
pH: 6 (ref 5.0–8.0)

## 2018-10-21 LAB — CBC
HCT: 33.7 % — ABNORMAL LOW (ref 36.0–46.0)
Hemoglobin: 10.6 g/dL — ABNORMAL LOW (ref 12.0–15.0)
MCH: 26.8 pg (ref 26.0–34.0)
MCHC: 31.5 g/dL (ref 30.0–36.0)
MCV: 85.1 fL (ref 80.0–100.0)
Platelets: 291 10*3/uL (ref 150–400)
RBC: 3.96 MIL/uL (ref 3.87–5.11)
RDW: 14.3 % (ref 11.5–15.5)
WBC: 12.3 10*3/uL — ABNORMAL HIGH (ref 4.0–10.5)
nRBC: 0.5 % — ABNORMAL HIGH (ref 0.0–0.2)

## 2018-10-21 LAB — LIPASE, BLOOD: Lipase: 72 U/L — ABNORMAL HIGH (ref 11–51)

## 2018-10-21 MED ORDER — SODIUM CHLORIDE 0.9 % IV SOLN
8.0000 mg | Freq: Once | INTRAVENOUS | Status: AC
  Administered 2018-10-21: 8 mg via INTRAVENOUS
  Filled 2018-10-21: qty 4

## 2018-10-21 MED ORDER — PROMETHAZINE HCL 25 MG/ML IJ SOLN
12.5000 mg | Freq: Once | INTRAMUSCULAR | Status: AC
  Administered 2018-10-21: 12.5 mg via INTRAVENOUS
  Filled 2018-10-21: qty 1

## 2018-10-21 MED ORDER — LACTATED RINGERS IV BOLUS
1000.0000 mL | Freq: Once | INTRAVENOUS | Status: AC
  Administered 2018-10-21: 1000 mL via INTRAVENOUS

## 2018-10-21 MED ORDER — DICYCLOMINE HCL 10 MG/ML IM SOLN
20.0000 mg | Freq: Once | INTRAMUSCULAR | Status: AC
  Administered 2018-10-21: 20 mg via INTRAMUSCULAR
  Filled 2018-10-21: qty 2

## 2018-10-21 MED ORDER — FAMOTIDINE IN NACL 20-0.9 MG/50ML-% IV SOLN
20.0000 mg | Freq: Once | INTRAVENOUS | Status: AC
  Administered 2018-10-21: 20 mg via INTRAVENOUS
  Filled 2018-10-21: qty 50

## 2018-10-21 MED ORDER — M.V.I. ADULT IV INJ
Freq: Once | INTRAVENOUS | Status: AC
  Administered 2018-10-21: 22:00:00 via INTRAVENOUS
  Filled 2018-10-21: qty 1000

## 2018-10-21 NOTE — MAU Note (Signed)
Pt stated she started having upper mid abd pain that radiates toward he back. Started having N/V with it about 4 hrs ago.

## 2018-10-21 NOTE — MAU Provider Note (Addendum)
History     CSN: [id number]  Arrival date and time: 10/21/18 1711   First Provider Initiated Contact with Patient 10/21/18 1734      Chief Complaint  Patient presents with  . Abdominal Pain  . Nausea  . Emesis   [id number] @26 .1 wks presenting with N/V, and abd pain. Reports ongoing N/V every time she eats throughout the pregnancy therefore she doesn't eat much. Today vomiting occurred without eating and upper abd pain started. Describes pain as intermittent and cramping in middle upper abd. Pain lasts for a few minutes then she vomits and it resolves. Reports 2 day hx of dysuria. No frequency, but ? hematuria. Reports +FM. Denies VB, LOF, and ctx. States "can you get her out, she's poisoning me". Admit to marijuana use 2 weeks ago.   OB History    Gravida  6   Para  2   Term  2   Preterm      AB  3   Living  2     SAB  1   TAB  2   Ectopic      Multiple  0   Live Births  2           Past Medical History:  Diagnosis Date  . Anemia   . Anxiety   . Asthma    last used a month ago  . Depression   . Infection    UTI  . Recurrent upper respiratory infection (URI)   . Shingles     Past Surgical History:  Procedure Laterality Date  . DILATION AND CURETTAGE OF UTERUS    . HAND TENDON SURGERY    . WISDOM TOOTH EXTRACTION      Family History  Problem Relation Age of Onset  . Cancer Mother   . Early death Mother   . Arthritis Father   . Diabetes Father   . Hypertension Father   . Varicose Veins Father     Social History   Tobacco Use  . Smoking status: Former Games developer  . Smokeless tobacco: Never Used  Substance Use Topics  . Alcohol use: Yes    Comment: rarely  . Drug use: Not Currently    Types: Marijuana    Comment: "long time ago"    Allergies:  Allergies  Allergen Reactions  . Latex Other (See Comments)    Causes burning.    Medications Prior to Admission  Medication Sig Dispense Refill Last Dose  . albuterol (PROVENTIL  HFA;VENTOLIN HFA) 108 (90 Base) MCG/ACT inhaler Inhale 2 puffs into the lungs every 6 (six) hours as needed for wheezing or shortness of breath.   Taking  . cyclobenzaprine (FLEXERIL) 5 MG tablet Take 1 tablet (5 mg total) by mouth 2 (two) times daily as needed for muscle spasms. 20 tablet 0   . ondansetron (ZOFRAN ODT) 8 MG disintegrating tablet Take 1 tablet (8 mg total) by mouth 2 (two) times daily as needed for nausea or vomiting. 30 tablet 0   . Prenatal Vit-Fe Fumarate-FA (PRENATAL MULTIVITAMIN) TABS tablet Take 1 tablet by mouth daily at 12 noon. (Patient not taking: Reported on 09/06/2018) 30 tablet 0 Not Taking  . Prenatal Vit-Fe Phos-FA-Omega (VITAFOL GUMMIES) 3.33-0.333-34.8 MG CHEW Chew 3 each by mouth daily. 90 tablet 11 Taking  . promethazine (PHENERGAN) 12.5 MG tablet Take 1 tablet (12.5 mg total) by mouth every 6 (six) hours as needed for nausea or vomiting. (Patient not taking: Reported on 08/02/2018) 30 tablet 1 Not Taking  . [  DISCONTINUED] feeding supplement, ENSURE ENLIVE, (ENSURE ENLIVE) LIQD Take 237 mLs by mouth 2 (two) times daily between meals. 237 mL 15     Review of Systems  Constitutional: Positive for chills. Negative for fever.  Gastrointestinal: Positive for abdominal pain, nausea and vomiting.  Genitourinary: Positive for dysuria and hematuria. Negative for frequency, urgency, vaginal bleeding and vaginal discharge.   Physical Exam   Blood pressure 123/67, pulse 84, temperature 98.5 F (36.9 C), resp. rate 18, last menstrual period 05/11/2018, unknown if currently breastfeeding.  Physical Exam  Constitutional: She is oriented to person, place, and time. She appears well-developed and well-nourished. She appears distressed.  HENT:  Head: Normocephalic and atraumatic.  Neck: Normal range of motion.  Cardiovascular: Normal rate.  Respiratory: Effort normal. No respiratory distress.  GI: Soft. She exhibits no distension and no mass. There is abdominal tenderness  in the epigastric area. There is no rebound and no guarding.  Genitourinary:    Genitourinary Comments: SVE closed/thick   Musculoskeletal: Normal range of motion.  Neurological: She is alert and oriented to person, place, and time.  Skin: Skin is warm and dry.  Psychiatric: She has a normal mood and affect.  EFM: 145 bpm, min variability, + accels, rare variable decel Toco: none  Results for orders placed or performed during the hospital encounter of 10/21/18 (from the past 24 hour(s))  CBC     Status: Abnormal   Collection Time: 10/21/18  5:57 PM  Result Value Ref Range   WBC 12.3 (H) 4.0 - 10.5 K/uL   RBC 3.96 3.87 - 5.11 MIL/uL   Hemoglobin 10.6 (L) 12.0 - 15.0 g/dL   HCT 38.2 (L) 50.5 - 39.7 %   MCV 85.1 80.0 - 100.0 fL   MCH 26.8 26.0 - 34.0 pg   MCHC 31.5 30.0 - 36.0 g/dL   RDW 67.3 41.9 - 37.9 %   Platelets 291 150 - 400 K/uL   nRBC 0.5 (H) 0.0 - 0.2 %  Comprehensive metabolic panel     Status: Abnormal   Collection Time: 10/21/18  5:57 PM  Result Value Ref Range   Sodium 136 135 - 145 mmol/L   Potassium 3.9 3.5 - 5.1 mmol/L   Chloride 108 98 - 111 mmol/L   CO2 19 (L) 22 - 32 mmol/L   Glucose, Bld 114 (H) 70 - 99 mg/dL   BUN 9 6 - 20 mg/dL   Creatinine, Ser 0.24 0.44 - 1.00 mg/dL   Calcium 8.7 (L) 8.9 - 10.3 mg/dL   Total Protein 6.5 6.5 - 8.1 g/dL   Albumin 3.1 (L) 3.5 - 5.0 g/dL   AST 23 15 - 41 U/L   ALT 9 0 - 44 U/L   Alkaline Phosphatase 75 38 - 126 U/L   Total Bilirubin 0.6 0.3 - 1.2 mg/dL   GFR calc non Af Amer >60 >60 mL/min   GFR calc Af Amer >60 >60 mL/min   Anion gap 9 5 - 15  Lipase, blood     Status: Abnormal   Collection Time: 10/21/18  5:57 PM  Result Value Ref Range   Lipase 72 (H) 11 - 51 U/L  Urinalysis, Routine w reflex microscopic     Status: Abnormal   Collection Time: 10/21/18  8:36 PM  Result Value Ref Range   Color, Urine YELLOW YELLOW   APPearance HAZY (A) CLEAR   Specific Gravity, Urine 1.027 1.005 - 1.030   pH 6.0 5.0 - 8.0    Glucose, UA  NEGATIVE NEGATIVE mg/dL   Hgb urine dipstick NEGATIVE NEGATIVE   Bilirubin Urine NEGATIVE NEGATIVE   Ketones, ur 80 (A) NEGATIVE mg/dL   Protein, ur 30 (A) NEGATIVE mg/dL   Nitrite NEGATIVE NEGATIVE   Leukocytes, UA TRACE (A) NEGATIVE   RBC / HPF 6-10 0 - 5 RBC/hpf   WBC, UA 21-50 0 - 5 WBC/hpf   Bacteria, UA NONE SEEN NONE SEEN   Squamous Epithelial / LPF 0-5 0 - 5   Mucus PRESENT    MAU Course  Procedures Orders Placed This Encounter  Procedures  . US Abdomen Limited RUQ    Standing Status:   Standing    Number of Occurrences:   1    Order Specific Question:   Symptom/Reason for Exam    Answer:   Upper abdominal pain [[id number]][[id number]]  . CBC    Standing Status:   Standing    Number of Occurrences:   1  . Comprehensive metabolic panel    Standing Status:   Standing    Number of Occurrences:   1  . Lipase, blood    Standing Status:   Standing    Number of Occurrences:   1  . Urinalysis, Routine w reflex microscopic    Standing Status:   Standing    Number of Occurrences:   1  . Discharge patient    Order Specific Question:   Discharge disposition    Answer:   01-Home or Self Care [1]    Order Specific Question:   Discharge patient date    Answer:   10/22/2018   Meds ordered this encounter  Medications  . lactated ringers bolus 1,000 mL  . ondansetron (ZOFRAN) 8 mg in sodium chloride 0.9 % 50 mL IVPB  . dicyclomine (BENTYL) injection 20 mg  . famotidine (PEPCID) IVPB 20 mg premix  . lactated ringers bolus 1,000 mL  . dextrose 5% lactated ringers 1,000 mL with multivitamins adult (INFUVITE ADULT) 10 mL infusion  . promethazine (PHENERGAN) injection 12.5 mg  . feeding supplement, ENSURE ENLIVE, (ENSURE ENLIVE) LIQD    Sig: Take 237 mLs by mouth 2 (two) times daily between meals.    Dispense:  237 mL    Refill:  15    Order Specific Question:   Supervising Provider    Answer:   Adam PhenixARNOLD, JAMES G [[id number]]  . ondansetron (ZOFRAN ODT) 4 MG disintegrating tablet    Sig:  Take 1 tablet (4 mg total) by mouth every 6 (six) hours as needed for nausea.    Dispense:  20 tablet    Refill:  3    Order Specific Question:   Supervising Provider    Answer:   Adam PhenixARNOLD, JAMES G [[id number]]  . promethazine (PHENERGAN) 25 MG tablet    Sig: Take 1 tablet (25 mg total) by mouth every 6 (six) hours as needed for nausea.    Dispense:  30 tablet    Refill:  3    Order Specific Question:   Supervising Provider    Answer:   Adam PhenixARNOLD, JAMES G [[id number]]  . pantoprazole (PROTONIX) 40 MG tablet    Sig: Take 1 tablet (40 mg total) by mouth daily.    Dispense:  30 tablet    Refill:  3    Order Specific Question:   Supervising Provider    Answer:   Adam PhenixARNOLD, JAMES G [[id number]]   MDM Labs and RUQ US ordered.  Transfer of care given to Leftwich-Kirby, CNM Leftwich-Kirby, Wilmer FloorLisa A, CNM  10/22/2018 12:31 AM   MDM; Lipase elevated at 72, otherwise labwork wnl.  On exam, pt with epigastric pain, no RUQ pain to palpation. No acute abdomen.   N/V has been persistent throughout pregnancy, so not new onset.  No illicit drug or alcohol use.  Unlikely pancreatitis but will continue to monitor.  Pt given 3 liters of fluid and Zofran, Pepcid, and Phenergan IV.  She reports improvement but not resolution of nausea and pain. No vomiting in MAU after medications given.  D/C home with regimen of Protonix daily, Zofran and Phenergan Q 6 hours, alternating so medication taken every 3 hours.  [facility] Ensure printed, [facility]. Pt to return to MAU if symptoms not improved in 48 hours Return sooner if worsening symptoms.  Consider repeating lipase if pt remains symptomatic.      Assessment and Plan   1. Nausea/vomiting in pregnancy   2. Supervision of other normal pregnancy, antepartum   3. Upper abdominal pain   4. Low weight gain during pregnancy in third trimester   5. Nausea and vomiting in pregnancy    D/C home, F/U in office, return to MAU for emergencies  Sharen CounterLisa Leftwich-Kirby,  CNM 12:35 AM

## 2018-10-22 DIAGNOSIS — Z3A26 26 weeks gestation of pregnancy: Secondary | ICD-10-CM

## 2018-10-22 DIAGNOSIS — O26892 Other specified pregnancy related conditions, second trimester: Secondary | ICD-10-CM

## 2018-10-22 DIAGNOSIS — O219 Vomiting of pregnancy, unspecified: Secondary | ICD-10-CM

## 2018-10-22 DIAGNOSIS — R1010 Upper abdominal pain, unspecified: Secondary | ICD-10-CM

## 2018-10-22 DIAGNOSIS — O2613 Low weight gain in pregnancy, third trimester: Secondary | ICD-10-CM

## 2018-10-22 MED ORDER — PANTOPRAZOLE SODIUM 40 MG PO TBEC: 40.0000 mg | DELAYED_RELEASE_TABLET | Freq: Every day | ORAL | 3 refills | Status: DC

## 2018-10-22 MED ORDER — ONDANSETRON 4 MG PO TBDP: 4.0000 mg | ORAL_TABLET | Freq: Four times a day (QID) | ORAL | 3 refills | Status: DC | PRN

## 2018-10-22 MED ORDER — ENSURE ENLIVE PO LIQD: 1.0000 | Freq: Two times a day (BID) | ORAL | 15 refills | Status: DC

## 2018-10-22 MED ORDER — PROMETHAZINE HCL 25 MG PO TABS: 25.0000 mg | ORAL_TABLET | Freq: Four times a day (QID) | ORAL | 3 refills | Status: DC | PRN

## 2018-11-01 ENCOUNTER — Ambulatory Visit (INDEPENDENT_AMBULATORY_CARE_PROVIDER_SITE_OTHER): Payer: Medicaid Other | Admitting: Clinical

## 2018-11-01 ENCOUNTER — Other Ambulatory Visit: Payer: Medicaid Other

## 2018-11-01 ENCOUNTER — Other Ambulatory Visit: Payer: Self-pay | Admitting: *Deleted

## 2018-11-01 ENCOUNTER — Ambulatory Visit (INDEPENDENT_AMBULATORY_CARE_PROVIDER_SITE_OTHER): Payer: Medicaid Other | Admitting: Nurse Practitioner

## 2018-11-01 VITALS — BP 115/62 | HR 86 | Wt 206.9 lb

## 2018-11-01 DIAGNOSIS — O2613 Low weight gain in pregnancy, third trimester: Secondary | ICD-10-CM

## 2018-11-01 DIAGNOSIS — O219 Vomiting of pregnancy, unspecified: Secondary | ICD-10-CM

## 2018-11-01 DIAGNOSIS — Z23 Encounter for immunization: Secondary | ICD-10-CM | POA: Diagnosis not present

## 2018-11-01 DIAGNOSIS — F332 Major depressive disorder, recurrent severe without psychotic features: Secondary | ICD-10-CM

## 2018-11-01 DIAGNOSIS — Z3480 Encounter for supervision of other normal pregnancy, unspecified trimester: Secondary | ICD-10-CM

## 2018-11-01 DIAGNOSIS — Z3482 Encounter for supervision of other normal pregnancy, second trimester: Secondary | ICD-10-CM | POA: Diagnosis not present

## 2018-11-01 NOTE — Progress Notes (Signed)
    Subjective:  Phyllis Phillips is a 28 y.o. [id number] at 247w5d being seen today for ongoing prenatal care.  She is currently monitored for the following issues for this low-risk pregnancy and has GBS bacteriuria; Supervision of other normal pregnancy, antepartum; Nausea/vomiting in pregnancy; and Poor weight gain of pregnancy on their problem list.  Additionally has a history of Depression and Anxiety and tends to score 18-22 on her GAD.  Patient reports she is feeling much better with no vomiting.  Did not take me medications this mornning but plans to go home and take them..  Contractions: Not present. Vag. Bleeding: Scant.  Movement: Present. Denies leaking of fluid.   The following portions of the patient's history were reviewed and updated as appropriate: allergies, current medications, past family history, past medical history, past social history, past surgical history and problem list. Problem list updated.  Objective:   Vitals:   11/01/18 1029  BP: 115/62  Pulse: 86  Weight: 206 lb 14.4 oz (93.8 kg)    Fetal Status: Fetal Heart Rate (bpm): 151 Fundal Height: 30 cm Movement: Present     General:  Alert, oriented and cooperative. Patient is in no acute distress.  Skin: Skin is warm and dry. No rash noted.   Cardiovascular: Normal heart rate noted  Respiratory: Normal respiratory effort, no problems with respiration noted  Abdomen: Soft, gravid, appropriate for gestational age. Pain/Pressure: Present     Pelvic:  Cervical exam deferred        Extremities: Normal range of motion.  Edema: None  Mental Status: Normal mood and affect. Normal behavior. Normal judgment and thought content.   Urinalysis:      Assessment and Plan:  Pregnancy: [id number] at 3147w5d  1. Supervision of other normal pregnancy, antepartum 2 hr glucola done today Got TDAP today  2. Nausea/vomiting in pregnancy Taking a combination of protonix, phenergan and zofran.  Is doing much better and using Ensure - has  gained 6 pounds.    3.  Dep[facility]  Client states she is "all over the place" with symptoms of depression  4.  Adoption placement? Has not made firm plans.  Has been so ill she has not talked with the agency she is considering.  Preterm labor symptoms and general obstetric precautions including but not limited to vaginal bleeding, contractions, leaking of fluid and fetal movement were reviewed in detail with the patient. Please refer to After Visit Summary for other counseling recommendations.  Return in about 2 weeks (around 11/15/2018).  Nolene BernheimERRI BURLESON, RN, MSN, NP-BC Nurse Practitioner, [facility], [facility] 11/01/2018 11:08 AM

## 2018-11-01 NOTE — BH Specialist Note (Signed)
Integrated Behavioral Health Initial Visit  MRN: [id number] Name: Phyllis Phillips  Number of Integrated Behavioral Health Clinician visits:: 2/6 Session Start time: 11:07  Session End time: 11:30 Total time: 20 minutes  Type of Service: Integrated Behavioral Health- Individual/Family Interpretor:No. Interpretor Name and Language: n/a   Warm Hand Off Completed.       SUBJECTIVE: Phyllis Phillips is a 28 y.o. female accompanied by n/a Patient was referred by Nolene Bernheim, NP for depression and anxiety Patient reports the following symptoms/concerns: Pt states her primary concern today is overwhelming fatigue, lack of appetite with nausea( feeling sick with most foods), making it difficult to make a decision regarding giving up baby for adoption. Pt is seeing a therapist at [facility], and missed appointment to see psychiatrist.  Duration of problem: Increase in current pregnancy; Severity of problem: severe  OBJECTIVE: Mood: Depressed and Affect: Depressed Risk of harm to self or others: No plan to harm self or others  LIFE CONTEXT: Family and Social: Pt lives with her two children (8yo son; 1yo daughter) School/Work: Pt working full-time for [employer] Self-Care: - Life Changes: Current pregnancy  GOALS ADDRESSED: Patient will: 1. Reduce symptoms of: depression 2. Increase knowledge and/or ability of: healthy habits  3. Demonstrate ability to: Increase healthy adjustment to current life circumstances  INTERVENTIONS: Interventions utilized: Motivational Interviewing and Psychoeducation and/or Health Education  Standardized Assessments completed: GAD-7 and PHQ 9  ASSESSMENT: Patient currently experiencing Major depressive disorder, recurrent, severe, and Psychosocial stress   Patient may benefit from continued brief therapeutic intervention today, regarding coping with symptoms of depression  PLAN: 1. Follow up with behavioral health clinician on : As needed 2. Behavioral  recommendations:  -Reschedule appointment with psychiatrist asap -Continue attending weekly therapy at [facility] -Continue taking prenatal vitamin(gummy with no iron) as recommended by medical provider -Eat one serving of Cream of Wheat daily, with a small orange, to increase iron consumption and absorption, to help decrease fatigue 3. Referral(s): Integrated Behavioral Health Services (In Clinic) 4. "From scale of 1-10, how likely are you to follow plan?": -  Rae Lips, LCSW  Depression screen [facility] 2/9 11/01/2018 09/06/2018 08/02/2018 08/02/2018 07/05/2018  Decreased Interest 3 3 3 3 3   Down, Depressed, Hopeless 3 3 3 3 3   PHQ - 2 Score 6 6 6 6 6   Altered sleeping 1 2 3 3 3   Tired, decreased energy 3 2 3 3 3   Change in appetite 3 3 3 3 3   Feeling bad or failure about yourself  3 2 3 3 3   Trouble concentrating 3 3 3 3 3   Moving slowly or fidgety/restless 3 0 0 0 1  Suicidal thoughts 0 0 0 0 0  PHQ-9 Score 22 18 21 21 22    GAD 7 : Generalized Anxiety Score 11/01/2018 09/06/2018 08/02/2018 08/02/2018  Nervous, Anxious, on Edge 2 3 3 3   Control/stop worrying 3 3 3 3   Worry too much - different things 3 3 3 3   Trouble relaxing 2 3 3 3   Restless 2 3 3 3   Easily annoyed or irritable 3 3 3 3   Afraid - awful might happen 3 3 3  0  Total GAD 7 Score 18 21 21  18

## 2018-11-02 ENCOUNTER — Encounter: Payer: Self-pay | Admitting: Nurse Practitioner

## 2018-11-02 DIAGNOSIS — D649 Anemia, unspecified: Secondary | ICD-10-CM | POA: Insufficient documentation

## 2018-11-02 LAB — CBC
Hematocrit: 30.1 % — ABNORMAL LOW (ref 34.0–46.6)
Hemoglobin: 10 g/dL — ABNORMAL LOW (ref 11.1–15.9)
MCH: 27 pg (ref 26.6–33.0)
MCHC: 33.2 g/dL (ref 31.5–35.7)
MCV: 81 fL (ref 79–97)
Platelets: 282 10*3/uL (ref 150–450)
RBC: 3.71 x10E6/uL — ABNORMAL LOW (ref 3.77–5.28)
RDW: 13.8 % (ref 11.7–15.4)
WBC: 6.1 10*3/uL (ref 3.4–10.8)

## 2018-11-02 LAB — GLUCOSE TOLERANCE, 2 HOURS W/ 1HR
GLUCOSE, FASTING: 95 mg/dL — AB (ref 65–91)
Glucose, 1 hour: 84 mg/dL (ref 65–179)
Glucose, 2 hour: 79 mg/dL (ref 65–152)

## 2018-11-02 LAB — HIV ANTIBODY (ROUTINE TESTING W REFLEX): HIV Screen 4th Generation wRfx: NONREACTIVE

## 2018-11-02 LAB — RPR: RPR Ser Ql: NONREACTIVE

## 2018-11-06 ENCOUNTER — Telehealth: Payer: Self-pay | Admitting: *Deleted

## 2018-11-06 NOTE — Telephone Encounter (Signed)
-----   Message from Currie Paris, NP sent at 11/02/2018  9:39 PM EST ----- One abnormal result on 2 hour glucola (just over the cut off) - please review with MD for confirmation (given that the last 2 results were normal) and schedule with nutritionist for gestational diabetes as needed.  Needs to begin iron supplement also.

## 2018-11-06 NOTE — Telephone Encounter (Signed)
I called Phyllis Phillips and left a message I am calling with non urgent information and to let you know we have scheduled you for an appointment this Thursday at 8am.  I will send you a MyChart message in detail . Please call us or message [place] if you have questions.

## 2018-11-09 ENCOUNTER — Encounter: Payer: Medicaid Other | Attending: Obstetrics and Gynecology | Admitting: *Deleted

## 2018-11-09 ENCOUNTER — Ambulatory Visit: Payer: Medicaid Other | Admitting: *Deleted

## 2018-11-09 DIAGNOSIS — O24419 Gestational diabetes mellitus in pregnancy, unspecified control: Secondary | ICD-10-CM | POA: Insufficient documentation

## 2018-11-09 DIAGNOSIS — O24410 Gestational diabetes mellitus in pregnancy, diet controlled: Secondary | ICD-10-CM

## 2018-11-09 DIAGNOSIS — Z3480 Encounter for supervision of other normal pregnancy, unspecified trimester: Secondary | ICD-10-CM

## 2018-11-09 DIAGNOSIS — Z3A00 Weeks of gestation of pregnancy not specified: Secondary | ICD-10-CM | POA: Diagnosis not present

## 2018-11-09 MED ORDER — ACCU-CHEK GUIDE W/DEVICE KIT: 1.0000 | PACK | Freq: Once | 0 refills | Status: AC

## 2018-11-09 MED ORDER — ACCU-CHEK FASTCLIX LANCETS MISC: 1.0000 | Freq: Four times a day (QID) | 12 refills | Status: DC

## 2018-11-09 MED ORDER — GLUCOSE BLOOD VI STRP: ORAL_STRIP | 12 refills | Status: DC

## 2018-11-09 NOTE — Progress Notes (Signed)
  Patient was seen on 11/09/2018 for Gestational Diabetes self-management. EDD 01/26/2019. Patient states no history of GDM. Diet history obtained. Patient eats poor variety of all food groups due to hyperemesis all day. She cannot keep most any food or beverage down and is using Ensure when she can afford it to help provide at least minimal nutrition. Beverages include water, milk and juice as able to tolerate.  She also states she was just fired from her job due to excessive medical appointments. The following learning objectives were met by the patient :   States the definition of Gestational Diabetes  Opportunity to try [employer] powder mixed with milk as less expensive meal replacement  States when to check blood glucose levels  Demonstrates proper blood glucose monitoring techniques  States the effect of stress and exercise on blood glucose levels  States the importance of limiting caffeine and abstaining from alcohol and smoking  I will cover at a future visit:  States why dietary management is important in controlling blood glucose  Describes the effects of carbohydrates on blood glucose levels  Demonstrates ability to create a balanced meal plan  Demonstrates carbohydrate counting   Plan:  Try [employer] Ess[employer] powder as potential meal replacement until emesis subsides Begin reading food labels for Total Carbohydrate of foods If OK with your MD, consider  increasing your activity level by walking, Arm Chair Exercises or other activity daily as tolerated Begin checking BG before breakfast and 2 hours after first bite of breakfast, lunch and dinner as directed by MD  Bring Log Book/Sheet to every medical appointment  OR Baby Scripts:  Patient was introduced to [employer] and plans to use as record of BG electronically  Take medication if directed by MD  Blood glucose monitor Rx called into pharmacy: Accu Check Guide with Fast Clix  drums Patient instructed to test pre breakfast and 2 hours each meal as directed by MD  Patient instructed to monitor glucose levels: FBS: 60 - 95 mg/dl 2 hour: <120 mg/dl  Patient received the following handouts:  Nutrition Diabetes and Pregnancy  Carbohydrate Counting List  BG Log Sheet  Patient will be seen for follow-up in 2 weeks to assess food tolerances and BG control.

## 2018-11-13 ENCOUNTER — Telehealth: Payer: Self-pay | Admitting: *Deleted

## 2018-11-13 NOTE — Telephone Encounter (Signed)
Received fax from [facility] stating that prior authorization is needed for Rx for Accu-chek Guide w/Device kit. I called and spoke w/pharmacist. I advised that Medicaid does cover this machine and does not require prior authorization. In the past when I have called Medicaid, I have been told that the pharmacy is not using the correct code. I requested the pharmacist contact Medicaid so that she can be given the information which Medicaid needs in order to approve the device. I then called pt and left a message stating that her pharmacy is still working on getting her glucose meter approved.

## 2018-11-15 ENCOUNTER — Encounter: Payer: Self-pay | Admitting: Medical

## 2018-11-15 DIAGNOSIS — O24419 Gestational diabetes mellitus in pregnancy, unspecified control: Secondary | ICD-10-CM | POA: Insufficient documentation

## 2018-11-23 ENCOUNTER — Other Ambulatory Visit: Payer: Self-pay

## 2018-11-28 ENCOUNTER — Encounter: Payer: Self-pay | Admitting: Family Medicine

## 2018-12-28 ENCOUNTER — Telehealth: Payer: Self-pay | Admitting: Family Medicine

## 2018-12-28 NOTE — Telephone Encounter (Signed)
I called Nota back and we discussed she has Zofran and phenergan ordered and both had 3 refills. She states she did not know she had refills - she will call the pharmacy for the refills.

## 2019-01-04 ENCOUNTER — Telehealth: Payer: Self-pay | Admitting: Student

## 2019-01-04 NOTE — Telephone Encounter (Signed)
Called the patient to inform of our new location. Left a detailed voicemail message.

## 2019-01-09 ENCOUNTER — Other Ambulatory Visit: Payer: Self-pay

## 2019-01-09 ENCOUNTER — Ambulatory Visit (INDEPENDENT_AMBULATORY_CARE_PROVIDER_SITE_OTHER): Payer: Medicaid Other | Admitting: Student

## 2019-01-09 ENCOUNTER — Other Ambulatory Visit (HOSPITAL_COMMUNITY)
Admission: RE | Admit: 2019-01-09 | Discharge: 2019-01-09 | Disposition: A | Discharge status: Home / Self Care | Payer: Medicaid Other | Source: Ambulatory Visit | Attending: Student | Admitting: Student

## 2019-01-09 VITALS — BP 129/83 | HR 85 | Temp 97.9°F | Wt 217.9 lb

## 2019-01-09 DIAGNOSIS — O24419 Gestational diabetes mellitus in pregnancy, unspecified control: Secondary | ICD-10-CM

## 2019-01-09 DIAGNOSIS — Z3A37 37 weeks gestation of pregnancy: Secondary | ICD-10-CM

## 2019-01-09 DIAGNOSIS — Z3480 Encounter for supervision of other normal pregnancy, unspecified trimester: Secondary | ICD-10-CM

## 2019-01-09 LAB — GLUCOSE, CAPILLARY: Glucose-Capillary: 81 mg/dL (ref 70–99)

## 2019-01-09 NOTE — Progress Notes (Signed)
       PRENATAL VISIT NOTE  Subjective:  Phyllis Phillips is a 28 y.o. [id number] at [redacted]w[redacted]d being seen today for ongoing prenatal care.  She is currently monitored for the following issues for this high-risk pregnancy and has GBS bacteriuria; Supervision of other normal pregnancy, antepartum; Nausea/vomiting in pregnancy; Poor weight gain of pregnancy; Anemia; and Gestational diabetes mellitus (GDM) affecting pregnancy, antepartum on their problem list.  Patient reports no complaints.  She has not been checking her sugar because she said sometimes it was high so she would "go to sleep". She says that it has been around 120-140. She retrieved her meter from the car and showed me a handful of readings since Feb; all of which were between 80-140 with a reading of 241 in Mid-March. Baby no longer up for adoption. Once she learned that she had diabetes, she didn't want to give it to parents in case it had any "health problems, which would be my fault".    Contractions: Not present. Vag. Bleeding: None.  Movement: Present. Denies leaking of fluid.   The following portions of the patient's history were reviewed and updated as appropriate: allergies, current medications, past family history, past medical history, past social history, past surgical history and problem list.   Objective:   Vitals:   01/09/19 0922  BP: 129/83  Pulse: 85  Temp: 97.9 F (36.6 C)  Weight: 217 lb 14.4 oz (98.8 kg)    Fetal Status: Fetal Heart Rate (bpm): 155 Fundal Height: 38 cm Movement: Present  Presentation: Vertex  General:  Alert, oriented and cooperative. Patient is in no acute distress.  Skin: Skin is warm and dry. No rash noted.   Cardiovascular: Normal heart rate noted  Respiratory: Normal respiratory effort, no problems with respiration noted  Abdomen: Soft, gravid, appropriate for gestational age.  Pain/Pressure: Present     Pelvic: Cervical exam performed Dilation: Closed      Extremities: Normal range of  motion.  Edema: None  Mental Status: Normal mood and affect. Normal behavior. Normal judgment and thought content.   Assessment and Plan:  Pregnancy: [id number] at [redacted]w[redacted]d 1. Gestational diabetes mellitus (GDM) affecting pregnancy, antepartum -Reviewed in detail with patient the risk of uncontrolled diabetes; patient agrees that she will bring her log next week and that she will check her sugars. She agrees to pay care attention to fetal movements.  cultiures done today -Not baby RX candidate as she is not reliable and does not keep appts.  -Patient will do Nexplanon - [place] MFM OB DETAIL +14 WK; Future - Glucose, capillary- normal fasting today (she had not eaten) - [place] MFM FETAL BPP W/NONSTRESS; Future - GC/Chlamydia probe amp ([place])not at [facility] - Culture, beta strep (group b only)  Term labor symptoms and general obstetric precautions including but not limited to vaginal bleeding, contractions, leaking of fluid and fetal movement were reviewed in detail with the patient. Please refer to After Visit Summary for other counseling recommendations.   Return in about 8 days (around 01/17/2019), or HROB with MD (not an APP) sometime in the afternoon of the 22nd or on Thursday next week .  Future Appointments  Date Time Provider Department Center  01/17/2019  8:30 AM [facility] [place] 1 [facility]    Marylene Land, CNM

## 2019-01-09 NOTE — Patient Instructions (Signed)

## 2019-01-10 LAB — GC/CHLAMYDIA PROBE AMP (~~LOC~~) NOT AT ARMC
Chlamydia: NEGATIVE
Neisseria Gonorrhea: NEGATIVE

## 2019-01-13 LAB — CULTURE, BETA STREP (GROUP B ONLY): Strep Gp B Culture: NEGATIVE

## 2019-01-16 ENCOUNTER — Telehealth: Payer: Self-pay | Admitting: Student

## 2019-01-16 NOTE — Telephone Encounter (Signed)
Called the patient to inform 

## 2019-01-17 ENCOUNTER — Other Ambulatory Visit: Payer: Self-pay

## 2019-01-17 ENCOUNTER — Ambulatory Visit (INDEPENDENT_AMBULATORY_CARE_PROVIDER_SITE_OTHER): Payer: Medicaid Other | Admitting: Obstetrics and Gynecology

## 2019-01-17 ENCOUNTER — Ambulatory Visit (HOSPITAL_COMMUNITY): Payer: Medicaid Other | Admitting: *Deleted

## 2019-01-17 ENCOUNTER — Encounter (HOSPITAL_COMMUNITY): Payer: Self-pay

## 2019-01-17 ENCOUNTER — Ambulatory Visit (HOSPITAL_COMMUNITY)
Admission: RE | Admit: 2019-01-17 | Discharge: 2019-01-17 | Disposition: A | Discharge status: Home / Self Care | Payer: Medicaid Other | Source: Ambulatory Visit | Attending: Obstetrics and Gynecology | Admitting: Obstetrics and Gynecology

## 2019-01-17 VITALS — BP 122/71 | HR 101 | Temp 98.5°F

## 2019-01-17 VITALS — Wt 221.0 lb

## 2019-01-17 DIAGNOSIS — Z3480 Encounter for supervision of other normal pregnancy, unspecified trimester: Secondary | ICD-10-CM

## 2019-01-17 DIAGNOSIS — O0990 Supervision of high risk pregnancy, unspecified, unspecified trimester: Secondary | ICD-10-CM | POA: Diagnosis present

## 2019-01-17 DIAGNOSIS — R8271 Bacteriuria: Secondary | ICD-10-CM

## 2019-01-17 DIAGNOSIS — O3663X0 Maternal care for excessive fetal growth, third trimester, not applicable or unspecified: Secondary | ICD-10-CM

## 2019-01-17 DIAGNOSIS — O24419 Gestational diabetes mellitus in pregnancy, unspecified control: Secondary | ICD-10-CM | POA: Diagnosis not present

## 2019-01-17 DIAGNOSIS — Z3A38 38 weeks gestation of pregnancy: Secondary | ICD-10-CM | POA: Diagnosis not present

## 2019-01-17 DIAGNOSIS — Z363 Encounter for antenatal screening for malformations: Secondary | ICD-10-CM

## 2019-01-17 DIAGNOSIS — O3660X0 Maternal care for excessive fetal growth, unspecified trimester, not applicable or unspecified: Secondary | ICD-10-CM

## 2019-01-17 DIAGNOSIS — O2613 Low weight gain in pregnancy, third trimester: Secondary | ICD-10-CM | POA: Diagnosis not present

## 2019-01-17 DIAGNOSIS — O98813 Other maternal infectious and parasitic diseases complicating pregnancy, third trimester: Secondary | ICD-10-CM

## 2019-01-17 LAB — POCT URINALYSIS DIP (DEVICE)
Bilirubin Urine: NEGATIVE
Glucose, UA: NEGATIVE mg/dL
Hgb urine dipstick: NEGATIVE
Ketones, ur: NEGATIVE mg/dL
Nitrite: NEGATIVE
Protein, ur: 30 mg/dL — AB
Specific Gravity, Urine: 1.025 (ref 1.005–1.030)
Urobilinogen, UA: 0.2 mg/dL (ref 0.0–1.0)
pH: 6 (ref 5.0–8.0)

## 2019-01-17 NOTE — Progress Notes (Signed)
Prenatal Visit Note Date: 01/17/2019 Clinic: [facility]  Subjective:  Phyllis Phillips is a 28 y.o. [id number] at [redacted]w[redacted]d being seen today for ongoing prenatal care.  She is currently monitored for the following issues for this high-risk pregnancy and has GBS bacteriuria; Supervision of other normal pregnancy, antepartum; Nausea/vomiting in pregnancy; Poor weight gain of pregnancy; Anemia; Gestational diabetes mellitus (GDM) affecting pregnancy, antepartum; and LGA (large for gestational age) fetus affecting management of mother on their problem list.  Patient reports no complaints.   No VB, decreased FM.   .  .   . Denies leaking of fluid.   The following portions of the patient's history were reviewed and updated as appropriate: allergies, current medications, past family history, past medical history, past social history, past surgical history and problem list. Problem list updated.  Objective:   Vitals:   01/17/19 1351  Weight: 221 lb (100.2 kg)    Fetal Status:           General:  Alert, oriented and cooperative. Patient is in no acute distress.  Skin: Skin is warm and dry. No rash noted.   Cardiovascular: Normal heart rate noted  Respiratory: Normal respiratory effort, no problems with respiration noted  Abdomen: Soft, gravid, appropriate for gestational age.       Pelvic:  Cervical exam deferred        Extremities: Normal range of motion.     Mental Status: Normal mood and affect. Normal behavior. Normal judgment and thought content.   Urinalysis:      Assessment and Plan:  Pregnancy: [id number] at [redacted]w[redacted]d  1. Supervision of other normal pregnancy, antepartum Routine care.   2. Excessive fetal growth affecting management of pregnancy, antepartum, single or unspecified fetus Per u/s today, 3675gm, lage ac, normal afi. Largest prior is 9lbs 6oz.   3. Gestational diabetes mellitus (GDM) affecting pregnancy, antepartum Checks sugars 1-2x per day sometimes and  some days no CBG checks. Some sugars elevated 10-20 points above normal and some normal. Recommend 39wk iol and will set up  4. GBS bacteriuria tx in labor  term labor symptoms and general obstetric precautions including but not limited to vaginal bleeding, contractions, leaking of fluid and fetal movement were reviewed in detail with the patient. Please refer to After Visit Summary for other counseling recommendations.  Return in about 5 weeks (around 02/21/2019) for pp visit.   [place] Bing, MD

## 2019-01-17 NOTE — Progress Notes (Signed)
IOL scheduled for 01/19/19 @ 0730.  Pt notified.

## 2019-01-18 ENCOUNTER — Encounter: Payer: Self-pay | Admitting: Obstetrics & Gynecology

## 2019-01-18 ENCOUNTER — Telehealth (HOSPITAL_COMMUNITY): Payer: Self-pay | Admitting: *Deleted

## 2019-01-18 ENCOUNTER — Other Ambulatory Visit (HOSPITAL_COMMUNITY): Payer: Self-pay | Admitting: *Deleted

## 2019-01-18 NOTE — Telephone Encounter (Signed)
Preadmission screen  

## 2019-01-19 ENCOUNTER — Encounter (HOSPITAL_COMMUNITY): Payer: Self-pay

## 2019-01-19 ENCOUNTER — Inpatient Hospital Stay (HOSPITAL_COMMUNITY): Payer: Medicaid Other | Admitting: Anesthesiology

## 2019-01-19 ENCOUNTER — Other Ambulatory Visit: Payer: Self-pay

## 2019-01-19 ENCOUNTER — Encounter (HOSPITAL_COMMUNITY)
Admission: AD | Disposition: A | Discharge status: Home / Self Care | Payer: Self-pay | Source: Home / Self Care | Attending: Obstetrics & Gynecology

## 2019-01-19 ENCOUNTER — Inpatient Hospital Stay (HOSPITAL_COMMUNITY)
Admission: AD | Admit: 2019-01-19 | Discharge: 2019-01-21 | DRG: 784 | Disposition: A | Discharge status: Home / Self Care | Payer: Medicaid Other | Attending: Obstetrics & Gynecology | Admitting: Obstetrics & Gynecology

## 2019-01-19 ENCOUNTER — Inpatient Hospital Stay (HOSPITAL_COMMUNITY): Payer: Medicaid Other

## 2019-01-19 DIAGNOSIS — Z98891 History of uterine scar from previous surgery: Secondary | ICD-10-CM

## 2019-01-19 DIAGNOSIS — Z302 Encounter for sterilization: Secondary | ICD-10-CM

## 2019-01-19 DIAGNOSIS — O9902 Anemia complicating childbirth: Secondary | ICD-10-CM | POA: Diagnosis present

## 2019-01-19 DIAGNOSIS — O321XX0 Maternal care for breech presentation, not applicable or unspecified: Secondary | ICD-10-CM | POA: Diagnosis present

## 2019-01-19 DIAGNOSIS — O24420 Gestational diabetes mellitus in childbirth, diet controlled: Secondary | ICD-10-CM | POA: Diagnosis not present

## 2019-01-19 DIAGNOSIS — O3663X0 Maternal care for excessive fetal growth, third trimester, not applicable or unspecified: Secondary | ICD-10-CM | POA: Diagnosis present

## 2019-01-19 DIAGNOSIS — D649 Anemia, unspecified: Secondary | ICD-10-CM | POA: Diagnosis not present

## 2019-01-19 DIAGNOSIS — J45909 Unspecified asthma, uncomplicated: Secondary | ICD-10-CM | POA: Diagnosis present

## 2019-01-19 DIAGNOSIS — Z3A39 39 weeks gestation of pregnancy: Secondary | ICD-10-CM

## 2019-01-19 DIAGNOSIS — Z87891 Personal history of nicotine dependence: Secondary | ICD-10-CM

## 2019-01-19 DIAGNOSIS — R8271 Bacteriuria: Secondary | ICD-10-CM | POA: Diagnosis present

## 2019-01-19 DIAGNOSIS — O99824 Streptococcus B carrier state complicating childbirth: Secondary | ICD-10-CM | POA: Diagnosis present

## 2019-01-19 DIAGNOSIS — O24419 Gestational diabetes mellitus in pregnancy, unspecified control: Secondary | ICD-10-CM | POA: Diagnosis present

## 2019-01-19 DIAGNOSIS — Z9119 Patient's noncompliance with other medical treatment and regimen: Secondary | ICD-10-CM

## 2019-01-19 DIAGNOSIS — O9952 Diseases of the respiratory system complicating childbirth: Secondary | ICD-10-CM | POA: Diagnosis present

## 2019-01-19 DIAGNOSIS — O3660X0 Maternal care for excessive fetal growth, unspecified trimester, not applicable or unspecified: Secondary | ICD-10-CM | POA: Diagnosis present

## 2019-01-19 HISTORY — DX: Gestational diabetes mellitus in pregnancy, unspecified control: O24.419

## 2019-01-19 LAB — CBC
HCT: 29.6 % — ABNORMAL LOW (ref 36.0–46.0)
Hemoglobin: 9 g/dL — ABNORMAL LOW (ref 12.0–15.0)
MCH: 23.7 pg — ABNORMAL LOW (ref 26.0–34.0)
MCHC: 30.4 g/dL (ref 30.0–36.0)
MCV: 78.1 fL — ABNORMAL LOW (ref 80.0–100.0)
Platelets: 243 10*3/uL (ref 150–400)
RBC: 3.79 MIL/uL — ABNORMAL LOW (ref 3.87–5.11)
RDW: 15.7 % — ABNORMAL HIGH (ref 11.5–15.5)
WBC: 6.5 10*3/uL (ref 4.0–10.5)
nRBC: 0 % (ref 0.0–0.2)

## 2019-01-19 LAB — TYPE AND SCREEN
ABO/RH(D): A POS
Antibody Screen: NEGATIVE

## 2019-01-19 LAB — GLUCOSE, CAPILLARY
Glucose-Capillary: 84 mg/dL (ref 70–99)
Glucose-Capillary: 70 mg/dL (ref 70–99)
Glucose-Capillary: 81 mg/dL (ref 70–99)

## 2019-01-19 SURGERY — Surgical Case
Anesthesia: Spinal

## 2019-01-19 MED ORDER — SENNOSIDES-DOCUSATE SODIUM 8.6-50 MG PO TABS
2.0000 | ORAL_TABLET | ORAL | Status: DC
  Administered 2019-01-20 (×2): 2 via ORAL
  Filled 2019-01-19 (×2): qty 2

## 2019-01-19 MED ORDER — NALBUPHINE HCL 10 MG/ML IJ SOLN: 5.0000 mg | INTRAMUSCULAR | Status: DC | PRN

## 2019-01-19 MED ORDER — LACTATED RINGERS IV SOLN
INTRAVENOUS | Status: DC
  Administered 2019-01-20: 01:00:00 via INTRAVENOUS

## 2019-01-19 MED ORDER — DIPHENHYDRAMINE HCL 50 MG/ML IJ SOLN: 12.5000 mg | INTRAMUSCULAR | Status: DC | PRN

## 2019-01-19 MED ORDER — GABAPENTIN 100 MG PO CAPS
100.0000 mg | ORAL_CAPSULE | Freq: Two times a day (BID) | ORAL | Status: DC
  Administered 2019-01-19 – 2019-01-21 (×4): 100 mg via ORAL
  Filled 2019-01-19 (×4): qty 1

## 2019-01-19 MED ORDER — ENOXAPARIN SODIUM 60 MG/0.6ML ~~LOC~~ SOLN
50.0000 mg | SUBCUTANEOUS | Status: DC
  Administered 2019-01-20: 22:00:00 50 mg via SUBCUTANEOUS
  Filled 2019-01-19 (×2): qty 0.6

## 2019-01-19 MED ORDER — NALBUPHINE HCL 10 MG/ML IJ SOLN
5.0000 mg | Freq: Once | INTRAMUSCULAR | Status: AC | PRN
  Administered 2019-01-19: 21:00:00 5 mg via INTRAVENOUS
  Filled 2019-01-19: qty 1

## 2019-01-19 MED ORDER — SODIUM CHLORIDE 0.9 % IV SOLN
5.0000 10*6.[IU] | Freq: Once | INTRAVENOUS | Status: AC
  Administered 2019-01-19: 5 10*6.[IU] via INTRAVENOUS
  Filled 2019-01-19: qty 5

## 2019-01-19 MED ORDER — ZOLPIDEM TARTRATE 5 MG PO TABS: 5.0000 mg | ORAL_TABLET | Freq: Every evening | ORAL | Status: DC | PRN

## 2019-01-19 MED ORDER — DIPHENHYDRAMINE HCL 25 MG PO CAPS: 25.0000 mg | ORAL_CAPSULE | Freq: Four times a day (QID) | ORAL | Status: DC | PRN

## 2019-01-19 MED ORDER — CEFAZOLIN SODIUM-DEXTROSE 2-4 GM/100ML-% IV SOLN
INTRAVENOUS | Status: AC
  Filled 2019-01-19: qty 100

## 2019-01-19 MED ORDER — LACTATED RINGERS IV SOLN
INTRAVENOUS | Status: DC | PRN
  Administered 2019-01-19 (×2): via INTRAVENOUS

## 2019-01-19 MED ORDER — KETOROLAC TROMETHAMINE 30 MG/ML IJ SOLN
INTRAMUSCULAR | Status: AC
  Filled 2019-01-19: qty 1

## 2019-01-19 MED ORDER — DEXAMETHASONE SODIUM PHOSPHATE 4 MG/ML IJ SOLN
INTRAMUSCULAR | Status: DC | PRN
  Administered 2019-01-19: 5 mg via INTRAVENOUS

## 2019-01-19 MED ORDER — FENTANYL CITRATE (PF) 100 MCG/2ML IJ SOLN
INTRAMUSCULAR | Status: AC
  Filled 2019-01-19: qty 2

## 2019-01-19 MED ORDER — ONDANSETRON HCL 4 MG/2ML IJ SOLN
INTRAMUSCULAR | Status: AC
  Filled 2019-01-19: qty 2

## 2019-01-19 MED ORDER — WITCH HAZEL-GLYCERIN EX PADS: 1.0000 "application " | MEDICATED_PAD | CUTANEOUS | Status: DC | PRN

## 2019-01-19 MED ORDER — SIMETHICONE 80 MG PO CHEW
80.0000 mg | CHEWABLE_TABLET | ORAL | Status: DC | PRN
  Filled 2019-01-19: qty 1

## 2019-01-19 MED ORDER — FENTANYL CITRATE (PF) 100 MCG/2ML IJ SOLN
25.0000 ug | INTRAMUSCULAR | Status: DC | PRN
  Administered 2019-01-19: 21:00:00 25 ug via INTRAVENOUS
  Administered 2019-01-19: 50 ug via INTRAVENOUS
  Administered 2019-01-19: 25 ug via INTRAVENOUS
  Administered 2019-01-19: 50 ug via INTRAVENOUS

## 2019-01-19 MED ORDER — PHENYLEPHRINE HCL-NACL 20-0.9 MG/250ML-% IV SOLN
INTRAVENOUS | Status: AC
  Filled 2019-01-19: qty 250

## 2019-01-19 MED ORDER — FENTANYL CITRATE (PF) 100 MCG/2ML IJ SOLN: 50.0000 ug | INTRAMUSCULAR | Status: DC | PRN

## 2019-01-19 MED ORDER — ONDANSETRON HCL 4 MG/2ML IJ SOLN: 4.0000 mg | Freq: Four times a day (QID) | INTRAMUSCULAR | Status: DC | PRN

## 2019-01-19 MED ORDER — OXYTOCIN 40 UNITS IN NORMAL SALINE INFUSION - SIMPLE MED: 2.5000 [IU]/h | INTRAVENOUS | Status: AC

## 2019-01-19 MED ORDER — KETOROLAC TROMETHAMINE 30 MG/ML IJ SOLN: 30.0000 mg | Freq: Four times a day (QID) | INTRAMUSCULAR | Status: AC | PRN

## 2019-01-19 MED ORDER — NALBUPHINE HCL 10 MG/ML IJ SOLN: 5.0000 mg | Freq: Once | INTRAMUSCULAR | Status: AC | PRN

## 2019-01-19 MED ORDER — BUPIVACAINE IN DEXTROSE 0.75-8.25 % IT SOLN
INTRATHECAL | Status: DC | PRN
  Administered 2019-01-19: 1.8 mL via INTRATHECAL

## 2019-01-19 MED ORDER — MEPERIDINE HCL 25 MG/ML IJ SOLN: 6.2500 mg | INTRAMUSCULAR | Status: DC | PRN

## 2019-01-19 MED ORDER — TETANUS-DIPHTH-ACELL PERTUSSIS 5-2.5-18.5 LF-MCG/0.5 IM SUSP: 0.5000 mL | Freq: Once | INTRAMUSCULAR | Status: DC

## 2019-01-19 MED ORDER — COCONUT OIL OIL: 1.0000 "application " | TOPICAL_OIL | Status: DC | PRN

## 2019-01-19 MED ORDER — OXYTOCIN 40 UNITS IN NORMAL SALINE INFUSION - SIMPLE MED: 2.5000 [IU]/h | INTRAVENOUS | Status: DC

## 2019-01-19 MED ORDER — IBUPROFEN 800 MG PO TABS
800.0000 mg | ORAL_TABLET | Freq: Four times a day (QID) | ORAL | Status: DC
  Administered 2019-01-20 – 2019-01-21 (×3): 800 mg via ORAL
  Filled 2019-01-19 (×3): qty 1

## 2019-01-19 MED ORDER — MENTHOL 3 MG MT LOZG: 1.0000 | LOZENGE | OROMUCOSAL | Status: DC | PRN

## 2019-01-19 MED ORDER — EPHEDRINE 5 MG/ML INJ
INTRAVENOUS | Status: AC
  Filled 2019-01-19: qty 10

## 2019-01-19 MED ORDER — CEFAZOLIN SODIUM-DEXTROSE 2-4 GM/100ML-% IV SOLN
2.0000 g | Freq: Once | INTRAVENOUS | Status: AC
  Administered 2019-01-19: 2 g via INTRAVENOUS

## 2019-01-19 MED ORDER — DEXAMETHASONE SODIUM PHOSPHATE 10 MG/ML IJ SOLN
INTRAMUSCULAR | Status: AC
  Filled 2019-01-19: qty 1

## 2019-01-19 MED ORDER — KETOROLAC TROMETHAMINE 30 MG/ML IJ SOLN
30.0000 mg | Freq: Four times a day (QID) | INTRAMUSCULAR | Status: AC
  Administered 2019-01-20 (×3): 30 mg via INTRAVENOUS
  Filled 2019-01-19 (×4): qty 1

## 2019-01-19 MED ORDER — LACTATED RINGERS IV SOLN: 500.0000 mL | INTRAVENOUS | Status: DC | PRN

## 2019-01-19 MED ORDER — NALOXONE HCL 4 MG/10ML IJ SOLN
1.0000 ug/kg/h | INTRAVENOUS | Status: DC | PRN
  Filled 2019-01-19: qty 5

## 2019-01-19 MED ORDER — SODIUM CHLORIDE 0.9 % IV SOLN
INTRAVENOUS | Status: DC | PRN
  Administered 2019-01-19: 40 [IU] via INTRAVENOUS

## 2019-01-19 MED ORDER — MEASLES, MUMPS & RUBELLA VAC IJ SOLR: 0.5000 mL | Freq: Once | INTRAMUSCULAR | Status: DC

## 2019-01-19 MED ORDER — DIPHENHYDRAMINE HCL 25 MG PO CAPS: 25.0000 mg | ORAL_CAPSULE | ORAL | Status: DC | PRN

## 2019-01-19 MED ORDER — TERBUTALINE SULFATE 1 MG/ML IJ SOLN: 0.2500 mg | Freq: Once | INTRAMUSCULAR | Status: DC | PRN

## 2019-01-19 MED ORDER — OXYCODONE HCL 5 MG PO TABS: 5.0000 mg | ORAL_TABLET | Freq: Once | ORAL | Status: DC | PRN

## 2019-01-19 MED ORDER — SCOPOLAMINE 1 MG/3DAYS TD PT72
1.0000 | MEDICATED_PATCH | Freq: Once | TRANSDERMAL | Status: DC
  Administered 2019-01-20: 01:00:00 1.5 mg via TRANSDERMAL
  Filled 2019-01-19: qty 1

## 2019-01-19 MED ORDER — ACETAMINOPHEN 325 MG PO TABS: 650.0000 mg | ORAL_TABLET | ORAL | Status: DC | PRN

## 2019-01-19 MED ORDER — OXYCODONE-ACETAMINOPHEN 5-325 MG PO TABS
1.0000 | ORAL_TABLET | ORAL | Status: DC | PRN
  Administered 2019-01-21: 1 via ORAL
  Filled 2019-01-19: qty 1

## 2019-01-19 MED ORDER — KETOROLAC TROMETHAMINE 30 MG/ML IJ SOLN
30.0000 mg | Freq: Four times a day (QID) | INTRAMUSCULAR | Status: AC | PRN
  Administered 2019-01-19: 30 mg via INTRAMUSCULAR

## 2019-01-19 MED ORDER — PENICILLIN G 3 MILLION UNITS IVPB - SIMPLE MED: 3.0000 10*6.[IU] | INTRAVENOUS | Status: DC

## 2019-01-19 MED ORDER — SOD CITRATE-CITRIC ACID 500-334 MG/5ML PO SOLN
30.0000 mL | ORAL | Status: DC | PRN
  Administered 2019-01-19: 17:00:00 30 mL via ORAL
  Filled 2019-01-19: qty 15

## 2019-01-19 MED ORDER — MISOPROSTOL 25 MCG QUARTER TABLET
25.0000 ug | ORAL_TABLET | ORAL | Status: DC | PRN
  Filled 2019-01-19: qty 1

## 2019-01-19 MED ORDER — LACTATED RINGERS IV SOLN
INTRAVENOUS | Status: DC
  Administered 2019-01-19: 12:00:00 via INTRAVENOUS

## 2019-01-19 MED ORDER — PHENYLEPHRINE HCL-NACL 20-0.9 MG/250ML-% IV SOLN
INTRAVENOUS | Status: DC | PRN
  Administered 2019-01-19: 60 ug/min via INTRAVENOUS

## 2019-01-19 MED ORDER — MORPHINE SULFATE (PF) 0.5 MG/ML IJ SOLN
INTRAMUSCULAR | Status: AC
  Filled 2019-01-19: qty 10

## 2019-01-19 MED ORDER — FENTANYL CITRATE (PF) 100 MCG/2ML IJ SOLN
INTRAMUSCULAR | Status: DC | PRN
  Administered 2019-01-19: 25 ug via INTRAVENOUS
  Administered 2019-01-19: 35 ug via INTRAVENOUS
  Administered 2019-01-19: 25 ug via INTRAVENOUS
  Administered 2019-01-19: 15 ug via INTRATHECAL

## 2019-01-19 MED ORDER — SIMETHICONE 80 MG PO CHEW
80.0000 mg | CHEWABLE_TABLET | Freq: Three times a day (TID) | ORAL | Status: DC
  Administered 2019-01-20 – 2019-01-21 (×4): 80 mg via ORAL
  Filled 2019-01-19 (×4): qty 1

## 2019-01-19 MED ORDER — LIDOCAINE HCL (PF) 1 % IJ SOLN: 30.0000 mL | INTRAMUSCULAR | Status: DC | PRN

## 2019-01-19 MED ORDER — MORPHINE SULFATE (PF) 0.5 MG/ML IJ SOLN
INTRAMUSCULAR | Status: DC | PRN
  Administered 2019-01-19: .15 mg via INTRATHECAL

## 2019-01-19 MED ORDER — OXYTOCIN BOLUS FROM INFUSION: 500.0000 mL | Freq: Once | INTRAVENOUS | Status: DC

## 2019-01-19 MED ORDER — PRENATAL MULTIVITAMIN CH
1.0000 | ORAL_TABLET | Freq: Every day | ORAL | Status: DC
  Administered 2019-01-20 – 2019-01-21 (×2): 1 via ORAL
  Filled 2019-01-19 (×2): qty 1

## 2019-01-19 MED ORDER — SODIUM CHLORIDE 0.9 % IV SOLN
INTRAVENOUS | Status: DC | PRN
  Administered 2019-01-19: 18:00:00 via INTRAVENOUS

## 2019-01-19 MED ORDER — DIBUCAINE (PERIANAL) 1 % EX OINT: 1.0000 "application " | TOPICAL_OINTMENT | CUTANEOUS | Status: DC | PRN

## 2019-01-19 MED ORDER — ONDANSETRON HCL 4 MG/2ML IJ SOLN
INTRAMUSCULAR | Status: DC | PRN
  Administered 2019-01-19: 4 mg via INTRAVENOUS

## 2019-01-19 MED ORDER — OXYCODONE HCL 5 MG/5ML PO SOLN: 5.0000 mg | Freq: Once | ORAL | Status: DC | PRN

## 2019-01-19 MED ORDER — TERBUTALINE SULFATE 1 MG/ML IJ SOLN
0.2500 mg | Freq: Once | INTRAMUSCULAR | Status: AC
  Administered 2019-01-19: 0.25 mg via SUBCUTANEOUS
  Filled 2019-01-19: qty 1

## 2019-01-19 MED ORDER — EPHEDRINE SULFATE-NACL 50-0.9 MG/10ML-% IV SOSY
PREFILLED_SYRINGE | INTRAVENOUS | Status: DC | PRN
  Administered 2019-01-19: 5 mg via INTRAVENOUS

## 2019-01-19 MED ORDER — SIMETHICONE 80 MG PO CHEW
80.0000 mg | CHEWABLE_TABLET | ORAL | Status: DC
  Administered 2019-01-20: 80 mg via ORAL
  Filled 2019-01-19: qty 1

## 2019-01-19 SURGICAL SUPPLY — 40 items
CLAMP CORD UMBIL (MISCELLANEOUS) IMPLANT
CLOTH BEACON ORANGE TIMEOUT ST (SAFETY) ×3 IMPLANT
DERMABOND ADHESIVE PROPEN (GAUZE/BANDAGES/DRESSINGS) ×2
DERMABOND ADVANCED (GAUZE/BANDAGES/DRESSINGS) ×4
DERMABOND ADVANCED .7 DNX12 (GAUZE/BANDAGES/DRESSINGS) ×2 IMPLANT
DERMABOND ADVANCED .7 DNX6 (GAUZE/BANDAGES/DRESSINGS) IMPLANT
DRSG OPSITE POSTOP 4X10 (GAUZE/BANDAGES/DRESSINGS) ×3 IMPLANT
ELECT REM PT RETURN 9FT ADLT (ELECTROSURGICAL) ×3
ELECTRODE REM PT RTRN 9FT ADLT (ELECTROSURGICAL) ×1 IMPLANT
EXTRACTOR VACUUM BELL STYLE (SUCTIONS) IMPLANT
GLOVE BIOGEL PI IND STRL 7.0 (GLOVE) ×1 IMPLANT
GLOVE BIOGEL PI IND STRL 8 (GLOVE) ×1 IMPLANT
GLOVE BIOGEL PI INDICATOR 7.0 (GLOVE) ×2
GLOVE BIOGEL PI INDICATOR 8 (GLOVE) ×2
GLOVE ECLIPSE 8.0 STRL XLNG CF (GLOVE) ×3 IMPLANT
GOWN STRL REUS W/TWL LRG LVL3 (GOWN DISPOSABLE) ×6 IMPLANT
KIT ABG SYR 3ML LUER SLIP (SYRINGE) ×3 IMPLANT
NDL HYPO 18GX1.5 BLUNT FILL (NEEDLE) ×1 IMPLANT
NDL HYPO 25X5/8 SAFETYGLIDE (NEEDLE) ×1 IMPLANT
NEEDLE HYPO 18GX1.5 BLUNT FILL (NEEDLE) ×3 IMPLANT
NEEDLE HYPO 22GX1.5 SAFETY (NEEDLE) ×3 IMPLANT
NEEDLE HYPO 25X5/8 SAFETYGLIDE (NEEDLE) ×3 IMPLANT
NS IRRIG 1000ML POUR BTL (IV SOLUTION) ×3 IMPLANT
PACK C SECTION WH (CUSTOM PROCEDURE TRAY) ×3 IMPLANT
PAD OB MATERNITY 4.3X12.25 (PERSONAL CARE ITEMS) ×3 IMPLANT
PENCIL SMOKE EVAC W/HOLSTER (ELECTROSURGICAL) ×3 IMPLANT
RTRCTR C-SECT PINK 25CM LRG (MISCELLANEOUS) IMPLANT
SUT CHROMIC 0 CT 1 (SUTURE) ×3 IMPLANT
SUT MNCRL 0 VIOLET CTX 36 (SUTURE) ×2 IMPLANT
SUT MONOCRYL 0 CTX 36 (SUTURE) ×8
SUT PLAIN 2 0 (SUTURE) ×6
SUT PLAIN 2 0 XLH (SUTURE) IMPLANT
SUT PLAIN ABS 2-0 CT1 27XMFL (SUTURE) ×2 IMPLANT
SUT VIC AB 0 CTX 36 (SUTURE) ×2
SUT VIC AB 0 CTX36XBRD ANBCTRL (SUTURE) ×1 IMPLANT
SUT VIC AB 4-0 KS 27 (SUTURE) IMPLANT
SYR 20CC LL (SYRINGE) ×6 IMPLANT
TOWEL OR 17X24 6PK STRL BLUE (TOWEL DISPOSABLE) ×3 IMPLANT
TRAY FOLEY W/BAG SLVR 14FR LF (SET/KITS/TRAYS/PACK) IMPLANT
WATER STERILE IRR 1000ML POUR (IV SOLUTION) ×3 IMPLANT

## 2019-01-19 NOTE — Anesthesia Procedure Notes (Signed)
Spinal  Patient location during procedure: OR Start time: 01/19/2019 5:56 PM End time: 01/19/2019 5:58 PM Preanesthetic Checklist Completed: patient identified, site marked, surgical consent, pre-op evaluation, timeout performed, IV checked, risks and benefits discussed and monitors and equipment checked Spinal Block Patient position: sitting Prep: DuraPrep Patient monitoring: heart rate, cardiac monitor, continuous pulse ox and blood pressure Approach: midline Location: L3-4 Injection technique: single-shot Needle Needle type: Sprotte  Needle gauge: 24 G Needle length: 9 cm Assessment Sensory level: T4

## 2019-01-19 NOTE — Transfer of Care (Signed)
Immediate Anesthesia Transfer of Care Note  Patient: Phyllis Phillips  Procedure(s) Performed: CESAREAN SECTION WITH BILATERAL TUBAL LIGATION (N/A )  Patient Location: PACU  Anesthesia Type:Spinal  Level of Consciousness: awake, alert  and oriented  Airway & Oxygen Therapy: Patient Spontanous Breathing  Post-op Assessment: Report given to RN and Post -op Vital signs reviewed and stable  Post vital signs: Reviewed and stable  Last Vitals:  Vitals Value Taken Time  BP 126/68 01/19/2019  7:17 PM  Temp    Pulse 84 01/19/2019  7:22 PM  Resp 15 01/19/2019  7:22 PM  SpO2 100 % 01/19/2019  7:22 PM  Vitals shown include unvalidated device data.  Last Pain:  Vitals:   01/19/19 1700  TempSrc: Oral  PainSc:          Complications: No apparent anesthesia complications

## 2019-01-19 NOTE — Discharge Summary (Signed)
OB Discharge Summary     [name] DOB: [birth year] MRN: [id number]  Date of admission: 01/19/2019 Delivering MD: Lazaro ArmsEURE, LUTHER H   Date of discharge: 01/21/2019  Admitting diagnosis: pregnancy Intrauterine pregnancy: 2668w0d     Secondary diagnosis:  Active Problems:   GBS bacteriuria   Anemia   LGA (large for gestational age) fetus affecting management of mother   GDM (gestational diabetes mellitus)   Status post primary low transverse cesarean section   Encounter for tubal ligation    Discharge diagnosis: Term Pregnancy Delivered by Cesarean section for Breech                                      Postpartum procedures:none Augmentation: None  Complications: None  Hospital course:  Induction of Labor With Cesarean Section  28 y.o. yo [phone] at 7368w0d was admitted to the hospital 01/19/2019 for induction of labor. Patient had a labor course significant for breech presentation at time of admission, s/p failed ECV. The patient went for cesarean section due to Malpresentation, and delivered a Viable infant,01/19/2019  Membrane Rupture Time/Date: 6:17 PM ,01/19/2019   Details of operation can be found in separate operative Note.  Patient had an uncomplicated postpartum course. She is ambulating, tolerating a regular diet, passing flatus, and urinating well. Pain is relatively well-controlled, and she was discharged on ibuprofen and oxycodone. Patient is discharged home in stable condition on 01/21/19.                                   Physical exam  Vitals:   01/20/19 0057 01/20/19 0437 01/20/19 2138 01/21/19 0538  BP: 116/70 110/70 115/69 108/65  Pulse: 69 77 86 70  Resp: 18  18 18   Temp: 98.7 F (37.1 C) 97.7 F (36.5 C) 98 F (36.7 C) 98 F (36.7 C)  TempSrc:  Tympanic Oral Oral  SpO2: 96% 99% 99% 96%  Weight:      Height:       General: alert, well-appearing, NAD Lochia: appropriate Uterine Fundus: firm Incision: Dressing is clean, dry, and intact DVT  Evaluation: No significant calf/ankle edema  Labs: Lab Results  Component Value Date   WBC 11.7 (H) 01/20/2019   HGB 7.8 (L) 01/20/2019   HCT 25.8 (L) 01/20/2019   MCV 78.4 (L) 01/20/2019   PLT 211 01/20/2019   CMP Latest Ref Rng & Units 01/20/2019  Glucose 70 - 99 mg/dL -  BUN 6 - 20 mg/dL -  Creatinine 0.270.44 - 2.531.00 mg/dL 6.640.61  Sodium 403135 - 474145 mmol/L -  Potassium 3.5 - 5.1 mmol/L -  Chloride 98 - 111 mmol/L -  CO2 22 - 32 mmol/L -  Calcium 8.9 - 10.3 mg/dL -  Total Protein 6.5 - 8.1 g/dL -  Total Bilirubin 0.3 - 1.2 mg/dL -  Alkaline Phos 38 - 259126 U/L -  AST 15 - 41 U/L -  ALT 0 - 44 U/L -    Discharge instruction: per After Visit Summary and "Baby and Me Booklet".  After visit meds:  Allergies as of 01/21/2019      Reactions   Latex Other (See Comments)   Causes burning.      Medication List    STOP taking these medications   Accu-Chek FastClix Lancets Misc   feeding supplement (ENSURE ENLIVE) Liqd  glucose blood test strip Commonly known as:  Accu-Chek Guide   ondansetron 4 MG disintegrating tablet Commonly known as:  Zofran ODT     TAKE these medications   albuterol 108 (90 Base) MCG/ACT inhaler Commonly known as:  VENTOLIN HFA Inhale 2 puffs into the lungs every 6 (six) hours as needed for wheezing or shortness of breath.   cyclobenzaprine 5 MG tablet Commonly known as:  FLEXERIL Take 1 tablet (5 mg total) by mouth 2 (two) times daily as needed for muscle spasms.   escitalopram 10 MG tablet Commonly known as:  LEXAPRO Take 1 tablet (10 mg total) by mouth daily.   ferrous sulfate 325 (65 FE) MG tablet Take 1 tablet (325 mg total) by mouth 2 (two) times daily with a meal.   ibuprofen 800 MG tablet Commonly known as:  ADVIL Take 1 tablet (800 mg total) by mouth 3 (three) times daily.   oxycodone 5 MG capsule Commonly known as:  OXY-IR Take 1 capsule (5 mg total) by mouth every 6 (six) hours as needed for up to 5 days.   pantoprazole 40 MG  tablet Commonly known as:  Protonix Take 1 tablet (40 mg total) by mouth daily.   promethazine 25 MG tablet Commonly known as:  PHENERGAN Take 1 tablet (25 mg total) by mouth every 6 (six) hours as needed for nausea.   senna-docusate 8.6-50 MG tablet Commonly known as:  Senokot-S Take 2 tablets by mouth at bedtime as needed for mild constipation.   Vitafol Gummies 3.33-0.333-34.8 MG Chew Chew 3 each by mouth daily.            Discharge Care Instructions  (From admission, onward)         Start     Ordered   01/21/19 0000  Discharge wound care:    Comments:  Keep dressing in place for 1 week then okay to remove. Okay to shower like normal.   01/21/19 0803         Diet: routine diet Activity: Advance as tolerated. Pelvic rest for 6 weeks.   Outpatient follow-up:2 weeks for incision check and 4-6 weeks for postpartum visit Follow up Appt:No future appointments. Follow up Visit:No follow-ups on file. Please schedule this patient for Postpartum visit in: 4 weeks with the following provider: Any provider For C/S patients schedule nurse incision check in weeks 2 weeks: yes High risk pregnancy complicated by: uncontrolled GDM, anemia  Delivery mode:  CS Anticipated Birth Control:  BTL done with CS  PP Procedures needed: 2 hour GTT, incision check  Schedule Integrated BH visit: no  Postpartum contraception: Tubal Ligation Completed   Newborn Data: Live born female  Birth Weight:  3480g APGAR: 8, 9  Newborn Delivery   Birth date/time:  01/19/2019 18:17:00 Delivery type:  C-Section, Low Transverse Trial of labor:  No C-section categorization:  Primary    Baby Feeding: Bottle Disposition:home with mother  01/21/2019 Tamera Stands, DO

## 2019-01-19 NOTE — Op Note (Signed)
OB Operative Report  Phyllis Phillips  PROCEDURE DATE: 01/19/2019  PREOPERATIVE DIAGNOSES: Intrauterine pregnancy at 1878w0d weeks gestation; malpresentation: breech s/p failed ECV; Undesired Fertility   POSTOPERATIVE DIAGNOSES: The same  PROCEDURE: Primary Low Transverse Cesarean Section,  Bilateral Tubal Sterilization using Pomeroy method    SURGEON:   Surgeon(s) and Role:    * Eure, Amaryllis DykeLuther H, MD - Primary    * Arvilla MarketWallace, Oluwatobi Ruppe Lauren, DO - Fellow    INDICATIONS: Phyllis Phillips is a 28 y.o. [phone] at 6078w0d here for cesarean section secondary to the indications listed under preoperative diagnoses; please see preoperative note for further details.  The risks of cesarean section were discussed with the patient including but were not limited to: bleeding which may require transfusion or reoperation; infection which may require antibiotics; injury to bowel, bladder, ureters or other surrounding organs; injury to the fetus; need for additional procedures including hysterectomy in the event of a life-threatening hemorrhage; placental abnormalities wth subsequent pregnancies, incisional problems, thromboembolic phenomenon and other postoperative/anesthesia complications.   The patient concurred with the proposed plan, giving informed written consent for the procedure.    Patient desires permanent sterilization. Risks and benefits of procedure discussed with patient including permanence of method, bleeding, infection, injury to surrounding organs and need for additional procedures. Risk failure of 0.5-1% with increased risk of ectopic gestation if pregnancy occurs was also discussed with patient.     FINDINGS:  Viable female infant in frank breech presentation.  Apgars 8 and 9.  Clear amniotic fluid.  Intact placenta, three vessel cord.  Normal uterus, fallopian tubes and ovaries bilaterally.  ANESTHESIA: Spinal INTRAVENOUS FLUIDS: 1800 mL ESTIMATED BLOOD LOSS: 460 mL URINE OUTPUT:  100  ml SPECIMENS: Placenta sent to L&D, portion of bilateral tubes  COMPLICATIONS: None immediate  PROCEDURE IN DETAIL:  The patient preoperatively received intravenous antibiotics and had sequential compression devices applied to her lower extremities.  She was then taken to the operating room where spinal anesthesia was administered and was found to be adequate. She was then placed in a dorsal supine position with a leftward tilt, and prepped and draped in a sterile manner.  A foley catheter was placed into her bladder and attached to constant gravity.     A time out was held and the above information confirmed.  A transverse was made and carried down through the subcutaneous tissue to the fascia. Fascial incision was made and extended transversely. The fascia was separated from the underlying rectus tissue superiorly and inferiorly. The peritoneum was identified and entered. Peritoneal incision was extended longitudinally.  A low transverse hysterotomy was made with a scalpel and extended bilaterally bluntly.  The infant was successfully delivered vertex, the cord was clamped without one minute delay due to uterine atony and bleeding, and the infant was handed over to the awaiting neonatology team.  The placenta was delivered manually intact and appeared normal. Cord blood was obtained for evaluation. The uterine outline, tubes and ovaries appeared normal. The uterine incision was closed with running locked sutures of 0 Monocryl, and an imbricating layer was also placed with running locked 0 Monocryl. Additional 0 Monocryl stiches were placed for bleeding. Hemostasis was observed. Attention was then turned to the tubes.The Babcock clamp was then used to grasp the left tube approximately 4 cm from the cornual region. A 3 cm segment of the tube was then ligated with free tie of plain gut suture, transected and excised. Good hemostasis was noted and the tube was returned to  the abdomen. The right fallopian tube  was then identified to its fimbriated end, ligated, and a 3 cm segment excised in a similar fashion. Excellent hemostasis was noted Lavage was carried out until clear. The peritoneum was closed with interrupted 0 chromic. The fascia was then closed using 0 Vicryl in a running fashion.  The subcutaneous layer was irrigated, then reapproximated with a 2-0 plain gut running stitch, and  the skin was closed with a 4-0 Vicryl subcuticular stitch.    Disposition: PACU - hemodynamically stable.   Maternal Condition: stable   Marcy Siren, D.O. OB Fellow  01/19/2019, 7:25 PM

## 2019-01-19 NOTE — Anesthesia Preprocedure Evaluation (Signed)
Anesthesia Evaluation  Patient identified by MRN, date of birth, ID band Patient awake    Reviewed: Allergy & Precautions, H&P , NPO status , Patient's Chart, lab work & pertinent test results  Airway Mallampati: II   Neck ROM: full    Dental   Pulmonary asthma , sleep apnea , former smoker,    breath sounds clear to auscultation       Cardiovascular negative cardio ROS   Rhythm:regular Rate:Normal     Neuro/Psych PSYCHIATRIC DISORDERS Anxiety Depression    GI/Hepatic   Endo/Other  diabetes, Gestational  Renal/GU      Musculoskeletal   Abdominal   Peds  Hematology  (+) Blood dyscrasia, anemia ,   Anesthesia Other Findings   Reproductive/Obstetrics (+) Pregnancy breech                             Anesthesia Physical Anesthesia Plan  ASA: II  Anesthesia Plan: Spinal   Post-op Pain Management:    Induction: Intravenous  PONV Risk Score and Plan: 2 and Ondansetron and Treatment may vary due to age or medical condition  Airway Management Planned: Simple Face Mask  Additional Equipment:   Intra-op Plan:   Post-operative Plan:   Informed Consent: I have reviewed the patients History and Physical, chart, labs and discussed the procedure including the risks, benefits and alternatives for the proposed anesthesia with the patient or authorized representative who has indicated his/her understanding and acceptance.       Plan Discussed with: CRNA, Anesthesiologist and Surgeon  Anesthesia Plan Comments:         Anesthesia Quick Evaluation

## 2019-01-19 NOTE — Anesthesia Postprocedure Evaluation (Signed)
Anesthesia Post Note  Patient: Phyllis Phillips  Procedure(s) Performed: CESAREAN SECTION WITH BILATERAL TUBAL LIGATION (N/A )     Patient location during evaluation: PACU Anesthesia Type: Spinal Level of consciousness: oriented and awake and alert Pain management: pain level controlled Vital Signs Assessment: post-procedure vital signs reviewed and stable Respiratory status: spontaneous breathing and respiratory function stable Cardiovascular status: blood pressure returned to baseline and stable Postop Assessment: no headache, no backache and no apparent nausea or vomiting Anesthetic complications: no    Last Vitals:  Vitals:   01/19/19 2015 01/19/19 2030  BP: 114/78 125/87  Pulse: 73 92  Resp: (!) 8 19  Temp:    SpO2: 99% 99%    Last Pain:  Vitals:   01/19/19 2015  TempSrc:   PainSc: 4    Pain Goal:    LLE Motor Response: Purposeful movement (01/19/19 2015)   RLE Motor Response: Purposeful movement (01/19/19 2015)   L Sensory Level: L3-Anterior knee, lower leg (01/19/19 2015) R Sensory Level: S1-Sole of foot, small toes (01/19/19 2015) Epidural/Spinal Function Cutaneous sensation: Able to Wiggle Toes (01/19/19 2030), Patient able to flex knees: Yes (01/19/19 2030), Patient able to lift hips off bed: Yes (01/19/19 2030), Back pain beyond tenderness at insertion site: No (01/19/19 2030), Progressively worsening motor and/or sensory loss: No (01/19/19 2030), Bowel and/or bladder incontinence post epidural: No (01/19/19 2030)  Lowella Curb

## 2019-01-19 NOTE — H&P (Signed)
Phyllis Phillips is a 28 y.o. female [phone] @ 39.0wks by 5.5wk scan presenting for IOL due to GDMA1, but noncompliant with checking sugars. Her preg has been followed by the [facility] office and has been remarkable for 1) GDMA1- noncompliance with checking sugars 2) EFW 3675gm >90% 3) GBS positive 4) anemia 5) asthma  OB History    Gravida  6   Para  2   Term  2   Preterm      AB  3   Living  2     SAB  1   TAB  2   Ectopic      Multiple  0   Live Births  2          Past Medical History:  Diagnosis Date  . Anemia   . Anxiety   . Asthma    weather, exercise related; does not use inhaler often  . Depression   . Gestational diabetes   . Infection    UTI  . Recurrent upper respiratory infection (URI)   . Shingles    Past Surgical History:  Procedure Laterality Date  . DILATION AND CURETTAGE OF UTERUS    . HAND TENDON SURGERY    . WISDOM TOOTH EXTRACTION     Family History: family history includes Arthritis in her father; Cancer in her mother; Diabetes in her father; Early death in her mother; Hypertension in her father; Varicose Veins in her father. Social History:  reports that she has quit smoking. She has never used smokeless tobacco. She reports current alcohol use. She reports previous drug use. Drug: Marijuana.     Maternal Diabetes: Yes:  Diabetes Type:  Diet controlled Genetic Screening: Normal Maternal Ultrasounds/Referrals: Normal Fetal Ultrasounds or other Referrals:  None Maternal Substance Abuse:  No Significant Maternal Medications:  None Significant Maternal Lab Results:  Lab values include: Group B Strep positive Other Comments:  noncompliance with checking sugars  ROS History Dilation: 1 Effacement (%): Thick Exam by:: K  CNM Blood pressure 124/67, pulse 79, temperature 98.9 F (37.2 C), temperature source Oral, resp. rate 18, height 5\' 6"  (1.676 m), weight 101.9 kg, last menstrual period 05/11/2018, unknown if currently  breastfeeding. Exam Physical Exam  Constitutional: She is oriented to person, place, and time. She appears well-developed.  HENT:  Head: Normocephalic.  Neck: Normal range of motion.  Cardiovascular: Normal rate.  Respiratory: Effort normal.  GI:  EFM 135-140, +accels, no decels, Cat 1 Ctx irreg, mild Suspect breech on bedside U/S  Musculoskeletal: Normal range of motion.  Neurological: She is alert and oriented to person, place, and time.  Skin: Skin is warm and dry.  Psychiatric: She has a normal mood and affect. Her behavior is normal. Thought content normal.    Prenatal labs: ABO, Rh: --/--/PENDING (04/24 1215) A+ (07/05/18) Antibody: PENDING (04/24 1215) negative (07/05/18) Rubella: 2.59 (10/09 1232) RPR: Non Reactive (02/05 0951)  HBsAg: Negative (10/09 1232)  HIV: Non Reactive (02/05 0951)  GBS:   positive 07/05/18 (bacteruria)  Assessment/Plan: IUP@39 .0wks GDMA1- noncomplicance GBS pos Suspect breech presentation  -Admit to Labor & Delivery -Have Dr Penne Lash rescan, and if breech verified will offer ECV vs pLTCS -Pt agreeable   Arabella Merles CNM 01/19/2019, 1:36 PM

## 2019-01-19 NOTE — Progress Notes (Signed)
Phyllis Phillips is a 28 y.o. [phone] at [redacted]w[redacted]d with breech presentation  Subjective: Pt comfortable and signed consent for version  Objective: BP 124/67 (BP Location: Right Arm)   Pulse 79   Temp 98.9 F (37.2 C) (Oral)   Resp 18   Ht 5\' 6"  (1.676 m)   Wt 101.9 kg   LMP 05/11/2018   BMI 36.25 kg/m  No intake/output data recorded. No intake/output data recorded.  FHT:  FHR: 140 bpm, variability: moderate,  accelerations:  Present,  decelerations:  Absent UC:   none SVE:   Dilation: 1 Effacement (%): Thick Exam by:: Marliyah Reid by ultrasound  Labs: Lab Results  Component Value Date   WBC 6.5 01/19/2019   HGB 9.0 (L) 01/19/2019   HCT 29.6 (L) 01/19/2019   MCV 78.1 (L) 01/19/2019   PLT 243 01/19/2019    Assessment / Plan: Breech wanting version    External Cephalic Version  Preprocedure Diagnosis:  Phyllis y.o. [id number] at [redacted] weeks gestational age with breech presentation  Post-procedure Diagnosis: Same  Procedure:  Attempted version and pt requests procedure to be stopped  Procedure in detail:   The patient was brought to Labor and Delivery where a reactive fetal heart tracing was obtained. The patient was noted to have irregular contractions. She was given 1 dose of subcutaneous terbutaline which resolved her contraction. A bedside ultrasound was performed which revealed single intrauterine pregnancy and breech presentation. There was noted to be adequate fluid. Using manual pressure, the breech was manipulated in a forward roll fashion.  The patient was unable to tolerate the discomfort of the procedure and asked to stop.  Pt placed back on monitor and consented for c/s.  The risks of cesarean section discussed with the patient included but were not limited to: bleeding which may require transfusion or reoperation; infection which may require antibiotics; injury to bowel, bladder, ureters or other surrounding organs; injury to the fetus; need for additional procedures including  hysterectomy in the event of a life-threatening hemorrhage; placental abnormalities wth subsequent pregnancies, incisional problems, thromboembolic phenomenon and other postoperative/anesthesia complications. The patient concurred with the proposed plan, giving informed written consent for the procedure.  Pt also wants a BTL.  Patient desires permanent sterilization.  Other reversible forms of contraception were discussed with patient; she declines all other modalities. Risks of procedure discussed with patient including but not limited to: risk of regret, permanence of method, bleeding, infection, injury to surrounding organs and need for additional procedures.  Failure risk of 0.5-1% with increased risk of ectopic gestation if pregnancy occurs was also discussed with patient.  Patient verbalized understanding of these risks and wants to proceed with sterilization.  Written informed consent obtained.  Pt does not have medicaid 30 papers signed b/c she was told hat we were not doing post partum BTLs due to Covid 19.  Now that patient is having a cesarean section, we can complete the BTL.        Phyllis Phillips 01/19/2019, 3:58 PM

## 2019-01-20 ENCOUNTER — Encounter (HOSPITAL_COMMUNITY): Payer: Self-pay | Admitting: Obstetrics & Gynecology

## 2019-01-20 LAB — CBC
HCT: 25.8 % — ABNORMAL LOW (ref 36.0–46.0)
Hemoglobin: 7.8 g/dL — ABNORMAL LOW (ref 12.0–15.0)
MCH: 23.7 pg — ABNORMAL LOW (ref 26.0–34.0)
MCHC: 30.2 g/dL (ref 30.0–36.0)
MCV: 78.4 fL — ABNORMAL LOW (ref 80.0–100.0)
Platelets: 211 10*3/uL (ref 150–400)
RBC: 3.29 MIL/uL — ABNORMAL LOW (ref 3.87–5.11)
RDW: 15.8 % — ABNORMAL HIGH (ref 11.5–15.5)
WBC: 11.7 10*3/uL — ABNORMAL HIGH (ref 4.0–10.5)
nRBC: 0 % (ref 0.0–0.2)

## 2019-01-20 LAB — CREATININE, SERUM
Creatinine, Ser: 0.61 mg/dL (ref 0.44–1.00)
GFR calc Af Amer: >60 mL/min (ref >60)
GFR calc non Af Amer: >60 mL/min (ref >60)

## 2019-01-20 LAB — RPR: RPR Ser Ql: NONREACTIVE

## 2019-01-20 MED ORDER — DIPHENHYDRAMINE HCL 50 MG/ML IJ SOLN
INTRAMUSCULAR | Status: AC
  Filled 2019-01-20: qty 1

## 2019-01-20 MED ORDER — PHENYLEPHRINE 40 MCG/ML (10ML) SYRINGE FOR IV PUSH (FOR BLOOD PRESSURE SUPPORT)
PREFILLED_SYRINGE | INTRAVENOUS | Status: AC
  Filled 2019-01-20: qty 10

## 2019-01-20 MED ORDER — ESCITALOPRAM OXALATE 10 MG PO TABS
10.0000 mg | ORAL_TABLET | Freq: Every day | ORAL | Status: DC
  Administered 2019-01-20 – 2019-01-21 (×2): 10 mg via ORAL
  Filled 2019-01-20 (×2): qty 1

## 2019-01-20 MED ORDER — ONDANSETRON HCL 4 MG/2ML IJ SOLN
INTRAMUSCULAR | Status: AC
  Filled 2019-01-20: qty 2

## 2019-01-20 MED ORDER — METOCLOPRAMIDE HCL 5 MG/ML IJ SOLN
INTRAMUSCULAR | Status: AC
  Filled 2019-01-20: qty 4

## 2019-01-20 MED ORDER — DEXAMETHASONE SODIUM PHOSPHATE 10 MG/ML IJ SOLN
INTRAMUSCULAR | Status: AC
  Filled 2019-01-20: qty 2

## 2019-01-20 NOTE — Progress Notes (Signed)
Subjective: Postpartum Day 1: Cesarean Delivery Patient reports incisional pain, tolerating PO and no problems voiding.    Objective: Vital signs in last 24 hours: Temp:  [97.7 F (36.5 C)-99.4 F (37.4 C)] 97.7 F (36.5 C) (04/25 0437) Pulse Rate:  [69-92] 77 (04/25 0437) Resp:  [5-30] 18 (04/25 0057) BP: (103-126)/(64-87) 110/70 (04/25 0437) SpO2:  [94 %-100 %] 99 % (04/25 0437) Weight:  [101.9 kg] 101.9 kg (04/24 1202)  Physical Exam:  General: alert, cooperative and no distress Lochia: appropriate Uterine Fundus: firm Incision: healing well, no significant drainage, Dressing clean and dry and intact DVT Evaluation: No evidence of DVT seen on physical exam.  Recent Labs    01/19/19 1222 01/20/19 0719  HGB 9.0* 7.8*  HCT 29.6* 25.8*    Assessment/Plan: Status post Cesarean section. Doing well postoperatively.  Continue current care.  [name] 01/20/2019, 11:08 AM

## 2019-01-20 NOTE — Progress Notes (Signed)
CSW received consult for history of anxiety and depression.  CSW met with MOB to offer support and complete assessment.    MOB propped up in bed holding infant to chest, when CSW entered the room. CSW introduced self and role and explained reason for consult. MOB soft spoken and quiet but easy to engage. MOB appropriate and attentive to infant during CSW visit. CSW inquired about MOB's mental health history to which MOB acknowledged a history of anxiety and depression. MOB reported she was diagnosed "a long time ago" and believes it was at least 10 years ago. MOB described her symptoms as a lot of ups and downs. MOB reported feeling physically and emotionally exhausted the last 7 days. MOB shared that she had planned to give infant up for adoption but was informed she had diabetes and did not want to take her insulin. MOB reported she spoke with her OBGYN who explained the risks to infant if MOB was not managing her diabetes such as baby potentially dying. MOB reported she decided to keep infant because she did not want to place baby up for adoption and have baby pass away with another family. MOB shared with CSW that infant did not have diabetes and that MOB plans to take infant home but is still battling with adoption. CSW validated MOB's feelings and concerns. MOB had been speaking with an adoption agency before changing her mind so MOB has their information, in addition, to a list provided by CSW at MOB's request. MOB reported she is taking things "one day at a time".  MOB reported having some PPD with her 29 year old. MOB described symptoms of feeling depressed and not attached to infant. MOB stated symptoms lasted for about a year. CSW provided education regarding the baby blues period vs. perinatal mood disorders, discussed treatment and gave resources for mental health follow up if concerns arise.  CSW recommends self-evaluation during the postpartum time period using the New Mom Checklist from Postpartum  Progress and encouraged MOB to contact a medical professional if symptoms are noted at any time. CSW completed Edinburgh Depression Scale with MOB and MOB scored a 20. CSW processed with MOB her thoughts and feelings. MOB denied any current SI, HI or DV. MOB reported having a limited support system consisting of some of her cousins. CSW inquired about MOB's interest in being referred for programs that could provide additional support to MOB and infant. MOB agreeable and appreciative. CSW to make [facility] and [facility] referral. CSW also spoke with [facility] who was agreeable to meet with MOB regarding medications and who stated they would schedule a one week follow up with IBH counselor at their clinic.   MOB confirmed having essential items for infant and reported infant would be sleeping in a pack 'n' play once home. CSW provided review of Sudden Infant Death Syndrome (SIDS) precautions and safe sleeping habits. CSW also to provide MOB with Baby Bundle.   MOB denied any further questions, concerns or need for resources from CSW. CSW identifies no further need for intervention and no barriers to discharge at this time.  Gaynelle Pastrana Irwin, LCSWA  [facility] [phone]  

## 2019-01-20 NOTE — Progress Notes (Signed)
Call from SW regarding pt's interest in starting medicine for depression/anxiety (see note).  Started on Lexapro 10mg . Risks/benefits/SE discussed. Message sent to ELAM for 1 week web ex f/u w/Jamie.

## 2019-01-21 MED ORDER — LIDOCAINE-EPINEPHRINE (PF) 2 %-1:200000 IJ SOLN
INTRAMUSCULAR | Status: AC
  Filled 2019-01-21: qty 10

## 2019-01-21 MED ORDER — ESCITALOPRAM OXALATE 10 MG PO TABS: 10.0000 mg | ORAL_TABLET | Freq: Every day | ORAL | 0 refills | Status: DC

## 2019-01-21 MED ORDER — FERROUS SULFATE 325 (65 FE) MG PO TABS: 325.0000 mg | ORAL_TABLET | Freq: Two times a day (BID) | ORAL | 0 refills | Status: DC

## 2019-01-21 MED ORDER — SENNOSIDES-DOCUSATE SODIUM 8.6-50 MG PO TABS: 2.0000 | ORAL_TABLET | Freq: Every evening | ORAL | 0 refills | Status: DC | PRN

## 2019-01-21 MED ORDER — OXYCODONE HCL 5 MG PO CAPS: 5.0000 mg | ORAL_CAPSULE | Freq: Four times a day (QID) | ORAL | 0 refills | Status: AC | PRN

## 2019-01-21 MED ORDER — IBUPROFEN 800 MG PO TABS: 800.0000 mg | ORAL_TABLET | Freq: Three times a day (TID) | ORAL | 0 refills | Status: DC

## 2019-01-21 NOTE — Lactation Note (Signed)
This note was copied from a baby's chart. Lactation Consultation Note  [name] [id number] Date: 01/21/2019 Reason for consult: Initial assessment;Follow-up assessment;Term  P3 mother whose infant is now 43 hours old.  Mother had a couple of questions related to breast feeding which I answered to her satisfaction.  Her breasts are large, soft and non tender and nipples are short shafted, everted and intact.  Mother stated she would like to know how she can help her nipples "come out more."  Even though they are short shafted there is enough nipple and breast tissue is compressible where I do not anticipate any issues with the length of the nipple.  However, since mother is interested, I provided breast shells and a manual pump with instructions for use.  Mother very appreciative of help.  Engorgement prevention/treatment discussed.  Mother has our OP phone number for questions/concerns after discharge.  Virtual support group info provided.  Mother has a DEBP for home use.  She is looking forward to being discharged today.  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  During my visit mother bonded appropriately and lovingly.  She looked at her baby and commented, "You are so beautiful."  She appears to be interested in continuing to breast feed and did not mention anything about adoption to me.     Maternal Data Formula Feeding for Exclusion: No Has patient been taught Hand Expression?: Yes  Feeding Feeding Type: Breast Fed  LATCH Score Latch: Grasps breast easily, tongue down, lips flanged, rhythmical sucking.  Audible Swallowing: A few with stimulation  Type of Nipple: Everted at rest and after stimulation  Comfort (Breast/Nipple): Soft / non-tender  Hold (Positioning): No assistance needed to correctly position infant at breast.  LATCH Score: 9  Interventions    Lactation Tools Discussed/Used Pump Review: Setup, frequency,  and cleaning Initiated by:: Aretta Stetzel Date initiated:: 01/21/19   Consult Status Consult Status: Complete    Phyllis Phillips 01/21/2019, 9:39 AM

## 2019-01-23 ENCOUNTER — Telehealth: Payer: Self-pay | Admitting: Student

## 2019-01-23 NOTE — Telephone Encounter (Signed)
Called the patient to inform of upcoming appointment. Left a detailed voicemail message. °

## 2019-01-29 ENCOUNTER — Telehealth: Payer: Self-pay | Admitting: *Deleted

## 2019-01-29 ENCOUNTER — Ambulatory Visit (INDEPENDENT_AMBULATORY_CARE_PROVIDER_SITE_OTHER): Payer: Medicaid Other | Admitting: Clinical

## 2019-01-29 DIAGNOSIS — F332 Major depressive disorder, recurrent severe without psychotic features: Secondary | ICD-10-CM | POA: Diagnosis present

## 2019-01-29 NOTE — Telephone Encounter (Signed)
-----   Message from Rae Lips, [place] sent at 01/29/2019  2:28 PM EDT ----- Regarding: Patient in pain now This patient is in a great deal of pain on the right side of her incision. She needs someone from clinical staff to call her back and give advice. She is worried that something is wrong.   She does not have a wound check appointment for another week out, but needs one sooner. She is currently 10 days postpartum.

## 2019-01-29 NOTE — Telephone Encounter (Signed)
Called pt do discuss pt's pain.  Pt states pain is on her right side of her incision.  Pt denies bleeding or drainage from incision site.  Pt states it is a burning pain that is worse with movement.  Pt states it is a 7/10 at rest and a 10/10 with movement.  Advised pt that this pain sounds like nerve pain from her healing incision.  Advised pt to take ibuprofen, acetaminophen and put ice on the area that hurts.  Advised pt that if this did not improve her pain at all, and she remained at a 7-10/10 then she needed to go to [facility] for evaluation.  Pt verbalized understanding.

## 2019-01-29 NOTE — BH Specialist Note (Signed)
Integrated Behavioral Health Telemedicine Follow Up Visit  MRN: [id number] Name: Phyllis Phillips  Number of Integrated Behavioral Health Clinician visits: 3/6 Session Start time: 1:48 Session End time: 2:03 Total time: 15 minutes   Type of Visit: Telephonic Patient location: Home [facility] Provider location: [facility] All persons participating in visit: Patient [name], [facility] Nikhita Mentzel. Pt's newborn  With pt.   Confirmed patient's address: Yes  Confirmed patient's phone number: Yes  Any changes to demographics: No   Confirmed patient's insurance: Yes  Any changes to patient's insurance: No   The following statements were read to the patient and/or legal guardian that are established with the [facility] Provider.  "The purpose of this phone visit is to provide behavioral health care while limiting exposure to the coronavirus (COVID19).  There is a possibility of technology failure and discussed alternative modes of communication if that failure occurs."  "By engaging in this telephone visit, you consent to the provision of healthcare.  Additionally, you authorize for your insurance to be billed for the services provided during this telephone visit."   Patient and/or legal guardian consented to telephone visit: Yes   Type of Service: Integrated Behavioral Health- Individual/Family Interpretor:No. Interpretor Name and Language: n/a  SUBJECTIVE: [name] is a 28 y.o. female accompanied by newborn Patient was referred by Luna Kitchens, CNM for [facility] med check postpartum. Patient reports the following symptoms/concerns: Pt states she is still attending [facility]; has not seen psychiatrist yet. Pt says she has been taking Lexapro as prescribed and "feel like I can get up and do things now", has regained her appetite, and tries to sleep when baby sleeps, and would like to continue on Lexapro.  Pt's primary concern today is pain on the right side of her incision.   Duration of problem: Most pressing concern today postpartum; Severity of problem: moderate  OBJECTIVE: Mood: Normal and Affect: Appropriate Risk of harm to self or others: No plan to harm self or others  LIFE CONTEXT: Family and Social: Pt lives with her three children (8yo, 2yo; newborn) School/Work: On maternity leave from full-time job at [employer] Self-Care: - Life Changes: Recent childbirth via cesarean  GOALS ADDRESSED: Patient will: 1.  Maintain reduction of  symptoms of: depression  2.  Demonstrate ability to: Increase healthy adjustment to current life circumstances  INTERVENTIONS: Interventions utilized:  Supportive Counseling Standardized Assessments completed: Not Needed  ASSESSMENT: Patient currently experiencing Major depressive disorder, recurrent, severe, without psychotic features  Patient may benefit from brief supportive counseling today and encouragement to continue ongoing therapy with her [facility]. .  PLAN: 1. Follow up with behavioral health clinician on : As needed 2. Behavioral recommendations:  -Continue taking Lexapro, as prescribed -Continue seeing therapist at [facility] -Answer phone today from [facility] to set up an earlier wound check appointment 3. Referral(s): Integrated [facility] [facility] (In Clinic) 4. "From scale of 1-10, how likely are you to follow plan?": 10  Shikara Mcauliffe C Parrish Daddario, LCSW

## 2019-01-31 ENCOUNTER — Other Ambulatory Visit: Payer: Self-pay

## 2019-01-31 ENCOUNTER — Encounter: Payer: Self-pay | Admitting: Obstetrics and Gynecology

## 2019-01-31 ENCOUNTER — Ambulatory Visit (INDEPENDENT_AMBULATORY_CARE_PROVIDER_SITE_OTHER): Payer: Medicaid Other | Admitting: Obstetrics and Gynecology

## 2019-01-31 VITALS — BP 113/68 | HR 68

## 2019-01-31 DIAGNOSIS — Z5189 Encounter for other specified aftercare: Secondary | ICD-10-CM

## 2019-01-31 NOTE — Progress Notes (Signed)
    Post Cesarean Section Incision Check  Phyllis Phillips is a 28 y.o. African-American [phone] (No LMP recorded.) who is s/p 1LTCS & BTL on 01/19/19 at 39 weeks for breech presentation, failed ECV, unwanted fertility. She was discharged to home on POD#2. Pregnancy complicated by gDMA1 and non-compliance with checking CBGs.  Complains of pain at right side of incision site. Denies other complaints, overall she is feeling well.   Physical Exam:  BP 113/68   Pulse 68  There is no height or weight on file to calculate BMI. General appearance: Well nourished, well developed female in no acute distress.  Abdomen: soft, appropriately tender, incision clean with significant amount of dermabond around it, which was all removed, incision clean, dry, intact with no signs of active bleeding or infection, some tenderness at right aspect of incision    Assessment: Patient is a 28 y.o. [id number] who is 2 weeks post partum from a primary c-section. She is doing well. Incision appears to be healing appropriately with no concerns for infection. She does have significant tenderness at right aspect of incision, no evidence for infection, suspect she is having nerve pain. Will refill pain meds, if no improvement, will consider gabapentin. Patient agreeable to plan.   Plan:  Follow up 2 weeks for post partum visit.   Baldemar Lenis, M.D. Attending [facility] for [employer] [facility])

## 2019-01-31 NOTE — Progress Notes (Signed)
Pt here today for incision check.  Pt reports that she has been severe pain from the right of the incision and the pain then radiates down her right leg.  Pt states that she has been taking pain medication however has not really effective.  Dr. Earlene Plater observed pt's incision, removed remaining dermabond, and prescribed pt more pain medication due to pt c/o pain radiating down her leg.  Pt stated understanding with no more questions.   Addison Naegeli, RN

## 2019-01-31 NOTE — Addendum Note (Signed)
Addended by: Leroy Libman on: 01/31/2019 02:03 PM   Modules accepted: Level of Service

## 2019-02-05 ENCOUNTER — Ambulatory Visit: Payer: Medicaid Other

## 2019-02-06 ENCOUNTER — Telehealth: Payer: Self-pay | Admitting: Family Medicine

## 2019-02-06 ENCOUNTER — Telehealth: Payer: Self-pay | Admitting: Lactation Services

## 2019-02-06 NOTE — Telephone Encounter (Signed)
Patient called in stating that she is still waiting for her prescription to be sent in. Informed patient that I would put in a message to the nurses and they will reach back out to her. Patient verbalized understanding. Message sent to clinical pool.

## 2019-02-06 NOTE — Telephone Encounter (Signed)
Placed outgoing phone call to patient in regards to message received from Office Registrar to inquire about what prescription patient is wanting to have filled. No answer from pt. LM for pt to call the office back at her earliest convenience.

## 2019-02-13 ENCOUNTER — Other Ambulatory Visit: Payer: Self-pay | Admitting: Family Medicine

## 2019-02-16 ENCOUNTER — Telehealth: Payer: Self-pay | Admitting: Family Medicine

## 2019-02-16 NOTE — Telephone Encounter (Signed)
Called patient and left a detailed message about downloading the Mychart app. Patient already signed up for [facility]

## 2019-02-20 ENCOUNTER — Telehealth: Payer: Medicaid Other | Admitting: Nurse Practitioner

## 2019-02-20 ENCOUNTER — Other Ambulatory Visit: Payer: Self-pay

## 2019-02-20 DIAGNOSIS — O24419 Gestational diabetes mellitus in pregnancy, unspecified control: Secondary | ICD-10-CM

## 2019-02-20 NOTE — Progress Notes (Unsigned)
2:02 I called Aileah and left a message I was calling a few minutes early for a virtual visit- I will call again in a few minutes- please be available by phone.  Sherice Ijames,RN 2:15 I called Johniya again and left another message I am calling for her virtual visit and will call again in a few minutes- please be available by phone: if we do not reach you - we will need to reschedule . Serenah Mill,RN 2:27 I called Amaurie again and left another message I am calling for your virtual visit - since we cannot reach you - you will need to be rescheduled. Please call our office to reschedule. You also need a fasting lab and they will make you that appointment also. Dent Plantz,RN

## 2019-02-26 ENCOUNTER — Other Ambulatory Visit: Payer: Self-pay

## 2019-02-26 ENCOUNTER — Encounter: Payer: Self-pay | Admitting: Advanced Practice Midwife

## 2019-02-26 ENCOUNTER — Telehealth (INDEPENDENT_AMBULATORY_CARE_PROVIDER_SITE_OTHER): Payer: Medicaid Other | Admitting: Advanced Practice Midwife

## 2019-02-26 DIAGNOSIS — Z1389 Encounter for screening for other disorder: Secondary | ICD-10-CM

## 2019-02-26 DIAGNOSIS — O99345 Other mental disorders complicating the puerperium: Secondary | ICD-10-CM

## 2019-02-26 DIAGNOSIS — F53 Postpartum depression: Secondary | ICD-10-CM | POA: Diagnosis not present

## 2019-02-26 NOTE — Progress Notes (Signed)
Subjective:     Phyllis Phillips is a 28 y.o. female who presents for a postpartum visit. She is 5 weeks postpartum following a low cervical transverse Cesarean section. I have fully reviewed the prenatal and intrapartum course. The delivery was at 39 gestational weeks. Outcome: cesarean indication: malpresentation: breech. Anesthesia: spinal. Postpartum course has been uncomplicated. Baby's course has been uncomplicated. Baby is feeding by breast. Bleeding no bleeding. Bowel function is normal. Bladder function is normal. Patient is not sexually active. Contraception method is condoms and tubal ligation. Postpartum depression screening: postive  The following portions of the patient's history were reviewed and updated as appropriate: allergies, current medications, past family history, past medical history, past social history, past surgical history and problem list.  Review of Systems Pertinent items are noted in HPI.    Edinburgh Postnatal Depression Scale - 02/26/19 1429      Edinburgh Postnatal Depression Scale:  In the Past 7 Days   I have been able to laugh and see the funny side of things.  1    I have looked forward with enjoyment to things.  1    I have blamed myself unnecessarily when things went wrong.  1    I have been anxious or worried for no good reason.  3    I have felt scared or panicky for no good reason.  3    Things have been getting on top of me.  1    I have been so unhappy that I have had difficulty sleeping.  1    I have felt sad or miserable.  1    I have been so unhappy that I have been crying.  1    The thought of harming myself has occurred to me.  0    Edinburgh Postnatal Depression Scale Total  13      Patient feels that lexapro is helping a lot. She is still having some anxiety. She would like to increase the dose at this time.   Objective:    There were no vitals taken for this visit. due to virtual visit   Assessment:      1. Postpartum care and  examination   2. Postpartum depression     Plan:    1. Contraception: tubal ligation 2. PP depression/anxiety, will increase lexapro to 20mg  QD and have patient schedule to see jamie in a couple of weeks for mood check.  3. Follow up in: 3 weeks or as needed.     Patient I disconnected at the very end of our conversation and I was unable to get her back on the phone. Will continue with plan that was discussed to increase lexapro and see Jamie in 2-3 weeks for mood check.    1:54: I called [facility] and left a message and left  A message I am calling for your virtual visit and will call again in a few minutes; please be available by phone. Linda,RN  I connected with  Phyllis Phillips on 02/26/19 at  2:15 PM EDT by telephone and verified that I am speaking with the correct person using two identifiers.   I discussed the limitations, risks, security and privacy concerns of performing an evaluation and management service by telephone and virtually and the availability of in person appointments. I also discussed with the patient that there may be a patient responsible charge related to this service. The patient expressed understanding and agreed to proceed.  Linda,RN 02/26/2019  2:14 PM  Thressa Sheller DNP, CNM  02/26/19  3:00 PM

## 2019-02-26 NOTE — Patient Instructions (Signed)
Perinatal Anxiety °When a woman feels excessive tension or worry (anxiety) during pregnancy or during the first 12 months after she gives birth, she has a condition called perinatal anxiety. Anxiety can interfere with work, school, relationships, and other everyday activities. If it is not managed properly, it can also cause problems in the mother and her baby.  °If you are pregnant and you have symptoms of an anxiety disorder, it is important to talk with your health care provider. °What are the causes? °The exact cause of this condition is not known. Hormonal changes during and after pregnancy may play a role in causing perinatal anxiety. °What increases the risk? °You are more likely to develop this condition if: °· You have a personal or family history of depression, anxiety, or mood disorders. °· You experience a stressful life event during pregnancy, such as the death of a loved one. °· You have a lot of regular life stress, such as being a single parent. °· You have thyroid problems. °What are the signs or symptoms? °Perinatal anxiety can be different for everyone. It may include: °· Panic attacks (panic disorder). These are intense episodes of fear or discomfort that may also cause sweating, nausea, shortness of breath, or fear of dying. They usually last 5-15 minutes. °· Reliving an upsetting (traumatic) event through distressing thoughts, dreams, or flashbacks (post-traumatic stress disorder, or PTSD). °· Excessive worry about multiple problems (generalized anxiety disorder). °· Fear and stress about leaving certain people or loved ones (separation anxiety). °· Performing repetitive tasks (compulsions) to relieve stress or worry (obsessive compulsive disorder, or OCD). °· Fear of certain objects or situations (phobias). °· Excessive worrying, such as a constant feeling that something bad is going to happen. °· Inability to relax. °· Difficulty concentrating. °· Sleep problems. °· Frequent nightmares or  disturbing thoughts. °How is this diagnosed? °This condition is diagnosed based on a physical exam and mental evaluation. In some cases, your health care provider may use an anxiety screening tool. These tools include a list of questions that can help a health care provider diagnose anxiety. Your health care provider may refer you to a mental health expert who specializes in anxiety. °How is this treated? °This condition may be treated with: °· Medicines. Your health care provider will only give you medicines that have been proven safe for pregnancy and breastfeeding. °· Talk therapy with a mental health professional to help change your patterns of thinking (cognitive behavioral therapy). °· Mindfulness-based stress reduction. °· Other relaxation therapies, such as deep breathing or guided muscle relaxation. °· Support groups. °Follow these instructions at home: °Lifestyle °· Do not use any products that contain nicotine or tobacco, such as cigarettes and e-cigarettes. If you need help quitting, ask your health care provider. °· Do not use alcohol when you are pregnant. After your baby is born, limit alcohol intake to no more than 1 drink a day. One drink equals 12 oz of beer, 5 oz of wine, or 1½ oz of hard liquor. °· Consider joining a support group for new mothers. Ask your health care provider for recommendations. °· Take good care of yourself. Make sure you: °? Get plenty of sleep. If you are having trouble sleeping, talk with your health care provider. °? Eat a healthy diet. This includes plenty of fruits and vegetables, whole grains, and lean proteins. °? Exercise regularly, as told by your health care provider. Ask your health care provider what exercises are safe for you. °General instructions °· Take over-the-counter   and prescription medicines only as told by your health care provider. °· Talk with your partner or family members about your feelings during pregnancy. Share any concerns or fears that you may  have. °· Ask for help with tasks or chores when you need it. Ask friends and family members to provide meals, watch your children, or help with cleaning. °· Keep all follow-up visits as told by your health care provider. This is important. °Contact a health care provider if: °· You (or people close to you) notice that you have any symptoms of anxiety or depression. °· You have anxiety and your symptoms get worse. °· You experience side effects from medicines, such as nausea or sleep problems. °Get help right away if: °· You feel like hurting yourself, your baby, or someone else. °If you ever feel like you may hurt yourself or others, or have thoughts about taking your own life, get help right away. You can go to your nearest emergency department or call: °· Your local emergency services (911 in the [place] °· A suicide crisis helpline, such as the National Suicide Prevention Lifeline at 1-800-273-8255. This is open 24 hours a day. °Summary °· Perinatal anxiety is when a woman feels excessive tension or worry during pregnancy or during the first 12 months after she gives birth. °· Perinatal anxiety may include panic attacks, post-traumatic stress disorder, separation anxiety, phobias, or generalized anxiety. °· Perinatal anxiety can cause physical health problems in the mother and baby if not properly managed. °· This condition is treated with medicines, talk therapy, stress reduction therapies, or a combination of two or more treatments. °· Talk with your partner or family members about your concerns or fears. Do not be afraid to ask for help. °This information is not intended to replace advice given to you by your health care provider. Make sure you discuss any questions you have with your health care provider. °Document Released: 11/10/2016 Document Revised: 11/10/2016 Document Reviewed: 11/10/2016 °Elsevier Interactive Patient Education © 2019 [employer] ° °

## 2019-03-15 ENCOUNTER — Other Ambulatory Visit: Payer: Self-pay | Admitting: Family Medicine

## 2019-03-15 NOTE — BH Specialist Note (Signed)
Pt did not arrive to [facility] video visit, and did not answer the phone; Left HIPPA-compliant message to call back Roselyn Reef from [facility] for [employer] at [phone].  Integrated Behavioral Health Visit via [employer] video (virtual)  03/15/2019 [name] [id number]   [place]

## 2019-03-19 ENCOUNTER — Other Ambulatory Visit: Payer: Self-pay

## 2019-03-19 ENCOUNTER — Ambulatory Visit (INDEPENDENT_AMBULATORY_CARE_PROVIDER_SITE_OTHER): Payer: Medicaid Other | Admitting: Clinical

## 2019-03-19 ENCOUNTER — Telehealth: Payer: Self-pay | Admitting: Advanced Practice Midwife

## 2019-03-19 ENCOUNTER — Ambulatory Visit: Payer: Medicaid Other | Admitting: Clinical

## 2019-03-19 DIAGNOSIS — F321 Major depressive disorder, single episode, moderate: Secondary | ICD-10-CM | POA: Diagnosis present

## 2019-03-19 NOTE — BH Specialist Note (Signed)
Integrated Behavioral Health Visit via Telemedicine (Telephone)  03/19/2019 Phyllis Phillips [id number]   Session Start time: 2:00  Session End time: 2:10 Total time: 15 minutes  Referring Provider: Marcille Buffy, CNM  Type of Visit: Telephonic Patient location: [facility] Provider location: [facility] All persons participating in visit: Patient Phyllis Phillips and [facility]. (Pt's newborn is in the background crying, while mom is attending to her).   Confirmed patient's address: Yes  Confirmed patient's phone number: Yes  Any changes to demographics: No   Confirmed patient's insurance: Yes  Any changes to patient's insurance: No   Discussed confidentiality: Discussed at previous visit   The following statements were read to the patient and/or legal guardian that are established with the [facility] Provider.  "The purpose of this phone visit is to provide behavioral health care while limiting exposure to the coronavirus (COVID19).  There is a possibility of technology failure and discussed alternative modes of communication if that failure occurs."  "By engaging in this telephone visit, you consent to the provision of healthcare.  Additionally, you authorize for your insurance to be billed for the services provided during this telephone visit."   Patient and/or legal guardian consented to telephone visit: Yes   PRESENTING CONCERNS: Patient and/or family reports the following symptoms/concerns: Pt states the increase in Lexapro is helping her to feel less anxious and depressed, with no problematic side effects. Pt is still attending [facility] and is waiting to get her initial appointment with psychiatry, to take over her [facility] medication management.  Duration of problem: Postpartum; Severity of problem: moderate  STRENGTHS (Protective Factors/Coping Skills): Self-awareness and open to continued treatment  GOALS ADDRESSED: Patient will: 1.  Reduce symptoms of:  anxiety and depression  2.  Demonstrate ability to: Increase healthy adjustment to current life circumstances  INTERVENTIONS: Interventions utilized:  Medication Monitoring Standardized Assessments completed: Not Needed  ASSESSMENT: Patient currently experiencing Major depressive disorder, moderate.   Patient may benefit from brief therapeutic intervention today.  PLAN: 1. Follow up with behavioral health clinician on : As needed 2. Behavioral recommendations:  -Continue taking medication as prescribed by medical provider -Continue attending weekly therapy sessions at [facility]; continue with [facility]  plan to establish with psychiatry 3. Referral(s): [place] (In Clinic)  [place]

## 2019-03-19 NOTE — Telephone Encounter (Signed)
Called the patient to inform of the [employer] webex meetings app due to the status of the visit. Left a detailed voicemail message of how to download and access the meeting via the app.

## 2019-03-19 NOTE — Telephone Encounter (Signed)
The patient called in stating she missed her visit due to the an appointment with her child. She stated she would complete the virtual visit if there are openings this afternoon. Rescheduled the appointment.

## 2019-03-20 ENCOUNTER — Other Ambulatory Visit: Payer: Self-pay

## 2019-04-12 ENCOUNTER — Other Ambulatory Visit: Payer: Self-pay | Admitting: Family Medicine

## 2019-04-19 ENCOUNTER — Ambulatory Visit: Payer: Self-pay

## 2019-04-19 NOTE — Lactation Note (Signed)
This note was copied from a baby's chart. Lactation Consultation Note:  LC was Phoned from [facility] Staff nurse to inform of and  MD order for [facility] consult.   Mother/Baby are  in room [phone]. Infant is 2 months old and has gastroenteritis. Infant was feeding poorly but is feeling better .   [place] phoned babies nurse Carney Bern for a report on baby. . Mother is cue base feeding infant.  staff member reports that mother was sat up with a DEBP. Nurse is not sure if mother is pumping.   [place] phoned Mother in room. Mother reports that she breastfed her first child for 1 yr and her 2nd child for 6 months.  Mother reports that infant had trouble feeding when first born. She reports that infant had a tongue tie and lip tie that was revised and now infant is feeding much better.  Mother reports that she is cue base feeding . She has a DEBP at home. She reports that she has pumped her breast while in the hospital . She reports that infant refuses a bottle.   LC plan is to follow up with  patient for next feeding.  Suggested that mother pump again and protect her milk supply by post pumping after each feeding every 2-3 hours of 15-20 mins.       [name] [id number] Date: 04/19/2019 Reason for consult: MD order   Maternal Data    Feeding Feeding Type: Breast Fed  LATCH Score                   Interventions    Lactation Tools Discussed/Used     Consult Status      Darla Lesches 04/19/2019, 1:21 PM

## 2019-04-19 NOTE — Lactation Note (Signed)
This note was copied from a baby's chart. Lactation Consultation Note  [name] [id number] Date: 04/19/2019 Reason for consult: Follow-up assessment   LC Follow Up Visit:  Attempted to observe baby feed, however, mother had recently breast fed her.  She was awake and alert but not interested in feeding.  Mother was using the cradle position and baby was "slipping" off the breast.  Suggested she try the football hold and mother agreeable.  Attempted a second time but baby was not ready to feed.  Informed mother to alert her RN at the next feed and I will return for a feeding observation.  Mother appreciative.  RN at lunch and will call her to report.                   Consult Status Consult Status: Follow-up Date: 04/19/19 Follow-up type: In-patient    Little Ishikawa 04/19/2019, 3:40 PM

## 2019-04-19 NOTE — Lactation Note (Signed)
This note was copied from a baby's chart. Lactation Consultation Note: Telephone  Call: [facility] phoned mothers staff nurse and informed that would not be able to see patient at this time and would see her for her next feeding.   Staff nurse reports that MD present at the desk and would like to talk to [facility]. Dr Angelina Ok reports that she has concerns about feeding patterns and thinks that mother would benefit from a inpatient visit with [place]. She would also like a phone call to report [facility] consult assessment.  LC informed that a visit would be possible today piror to mothers discharge.   [place] phoned mother back in her room to inform her that someone would see her for a feeding today.   LC ask how infant was feeding and she reports that infant is feeding all the time. She reports that she has not pumped her breast.     [name] [id number] Date: 04/19/2019 Reason for consult: MD order   Maternal Data    Feeding    LATCH Score                   Interventions    Lactation Tools Discussed/Used     Consult Status      Darla Lesches 04/19/2019, 2:34 PM

## 2019-04-30 ENCOUNTER — Telehealth: Payer: Medicaid Other | Admitting: Clinical

## 2019-04-30 ENCOUNTER — Other Ambulatory Visit: Payer: Self-pay

## 2019-04-30 ENCOUNTER — Encounter: Payer: Self-pay | Admitting: Advanced Practice Midwife

## 2019-04-30 DIAGNOSIS — Z91199 Patient's noncompliance with other medical treatment and regimen due to unspecified reason: Secondary | ICD-10-CM

## 2019-04-30 DIAGNOSIS — Z5329 Procedure and treatment not carried out because of patient's decision for other reasons: Secondary | ICD-10-CM

## 2019-04-30 NOTE — BH Specialist Note (Signed)
Pt did not arrive to MyChart video visit and did not answer the phone; Left HIPPA-compliant message to call back Roselyn Reef from [facility] [employer] at [phone].    [place] via Telemedicine Video Visit  04/30/2019 Rhealyn Cullen [phone]  [place]

## 2019-05-08 ENCOUNTER — Other Ambulatory Visit: Payer: Self-pay | Admitting: Family Medicine

## 2019-08-02 ENCOUNTER — Ambulatory Visit: Payer: Medicaid Other | Admitting: Obstetrics and Gynecology

## 2019-08-20 ENCOUNTER — Other Ambulatory Visit: Payer: Self-pay

## 2019-08-20 ENCOUNTER — Ambulatory Visit (INDEPENDENT_AMBULATORY_CARE_PROVIDER_SITE_OTHER): Payer: Medicaid Other | Admitting: Family Medicine

## 2019-08-20 ENCOUNTER — Other Ambulatory Visit (HOSPITAL_COMMUNITY)
Admission: RE | Admit: 2019-08-20 | Discharge: 2019-08-20 | Disposition: A | Discharge status: Home / Self Care | Payer: Medicaid Other | Source: Ambulatory Visit | Attending: Family Medicine | Admitting: Family Medicine

## 2019-08-20 VITALS — BP 116/74 | HR 83

## 2019-08-20 DIAGNOSIS — Z124 Encounter for screening for malignant neoplasm of cervix: Secondary | ICD-10-CM | POA: Diagnosis present

## 2019-08-20 DIAGNOSIS — F3289 Other specified depressive episodes: Secondary | ICD-10-CM | POA: Diagnosis not present

## 2019-08-20 DIAGNOSIS — N760 Acute vaginitis: Secondary | ICD-10-CM | POA: Diagnosis present

## 2019-08-20 MED ORDER — ESCITALOPRAM OXALATE 20 MG PO TABS: 20.0000 mg | ORAL_TABLET | Freq: Every day | ORAL | 5 refills | Status: AC

## 2019-08-20 NOTE — Progress Notes (Signed)
GYNECOLOGY OFFICE VISIT NOTE  History:   Phyllis Phillips is a 28 y.o. [phone] here today for multiple issues.  #pap Reports she has had pap's in the past Reports abnormal ones in the past, but unclear on details Has never had colposcopy Thinks she had the HPV vaccine  #Post op pain Still hurting from tubal Has been there since her CS/BTL Has not changed considerably since that time Mostly on the R side Op note reviewed, unremarkable Is breastfeeding Not constipated   #vaginal discharge Discharge Cloudy white discharge Some burning or itching before but then switched soaps Had been washing inside vagina  #Depression/anxiety Ran out of lexapro Would like refill Feels down and depressed Denies SI Worked well in the past   Past Medical History:  Diagnosis Date  . Anemia   . Anxiety   . Asthma    weather, exercise related; does not use inhaler often  . Depression   . Gestational diabetes   . Infection    UTI  . Recurrent upper respiratory infection (URI)   . Shingles     Past Surgical History:  Procedure Laterality Date  . CESAREAN SECTION WITH BILATERAL TUBAL LIGATION N/A 01/19/2019   Procedure: CESAREAN SECTION WITH BILATERAL TUBAL LIGATION;  Surgeon: Lazaro Arms, MD;  Location: [facility] LD ORS;  Service: Obstetrics;  Laterality: N/A;  . DILATION AND CURETTAGE OF UTERUS    . HAND TENDON SURGERY    . WISDOM TOOTH EXTRACTION      The following portions of the patient's history were reviewed and updated as appropriate: allergies, current medications, past family history, past medical history, past social history, past surgical history and problem list.   Health Maintenance:  No pap on file, not due for mammo.   Review of Systems:  Pertinent items noted in HPI and remainder of comprehensive ROS otherwise negative.  Physical Exam:  BP 116/74   Pulse 83  CONSTITUTIONAL: Well-developed, well-nourished female in no acute distress.  HEENT:  Normocephalic,  atraumatic. External right and left ear normal. No scleral icterus.  NECK: Normal range of motion, supple, no masses noted on observation SKIN: No rash noted. Not diaphoretic. No erythema. No pallor. MUSCULOSKELETAL: Normal range of motion. No edema noted. NEUROLOGIC: Alert and oriented to person, place, and time. Normal muscle tone coordination. PSYCHIATRIC: Normal mood and affect. Normal behavior. Normal judgment and thought content. RESPIRATORY: Effort normal, no problems with respiration noted ABDOMEN: No masses noted. No other overt distention noted.   PELVIC: Normal appearing external genitalia; normal appearing vaginal mucosa and cervix.  No abnormal discharge noted.  Normal uterine size, no other palpable masses, no uterine or adnexal tenderness.  Labs and Imaging No results found for this or any previous visit (from the past 168 hour(s)). No results found.    Assessment and Plan:   #pap Collected today  #Post op pain Unclear etiology, possibly related to post-op changes from cesarean or btl Discussed watchful waiting at our visit Called her after our visit, unlikely given duration of pain that this is ectopic pregnancy but this should be ruled out As it is after hours patient to go to store to get pregnancy test, if positive will go to hospital, otherwise I will call to check in on her tomorrow  #vaginal discharge Vaginitis swab today, results by my chart  #Depression/anxiety Limited refill given of lexapro, encouraged to follow up w [facility] for ongoing treatment  Problem List Items Addressed This Visit    None  Visit Diagnoses    Acute vaginitis    -  Primary   Relevant Orders   Cervicovaginal ancillary only( [place])   Other depression       Relevant Medications   escitalopram (LEXAPRO) 20 MG tablet   Screening for cervical cancer       Relevant Orders   Cytology - PAP( [place])      Routine preventative health maintenance measures emphasized. Please  refer to After Visit Summary for other counseling recommendations.   No follow-ups on file.    Total face-to-face time with patient: 25 minutes.  Over 50% of encounter was spent on counseling and coordination of care.   Augustin Coupe, [place] [facility] [employer], [place]

## 2019-08-21 ENCOUNTER — Ambulatory Visit: Payer: Medicaid Other | Admitting: Family Medicine

## 2019-08-22 LAB — CERVICOVAGINAL ANCILLARY ONLY
Bacterial Vaginitis (gardnerella): POSITIVE — AB
Candida Glabrata: NEGATIVE
Candida Vaginitis: NEGATIVE
Chlamydia: POSITIVE — AB
Comment: NEGATIVE
Comment: NEGATIVE
Comment: NEGATIVE
Comment: NEGATIVE
Comment: NEGATIVE
Comment: NORMAL
Neisseria Gonorrhea: NEGATIVE
Trichomonas: POSITIVE — AB

## 2019-08-24 LAB — CYTOLOGY - PAP: Diagnosis: NEGATIVE

## 2019-08-27 ENCOUNTER — Telehealth: Payer: Self-pay

## 2019-08-27 ENCOUNTER — Other Ambulatory Visit: Payer: Self-pay

## 2019-08-27 MED ORDER — METRONIDAZOLE 500 MG PO TABS: 500.0000 mg | ORAL_TABLET | Freq: Two times a day (BID) | ORAL | 0 refills | Status: AC

## 2019-08-27 MED ORDER — AZITHROMYCIN 500 MG PO TABS: 1000.0000 mg | ORAL_TABLET | Freq: Every day | ORAL | 0 refills | Status: AC

## 2019-08-27 NOTE — Telephone Encounter (Signed)
Called patient to follow up from last message, reports took pregnancy test that was negative.  Discussed normal pap, and wet prep with +BV, trich, chlamydia. Discussed trich/chlamydia are STI's and she should inform partner of diagnosis, take treatment and abstain from intercourse for one week. Rx sent for flagyl and Azithromycin to pharmacy.  Please call patient and offer EPT and follow up visit for [facility], patient is aware you will be calling her for this.

## 2019-08-27 NOTE — Telephone Encounter (Signed)
Called pt to let her know of EPT for Partner & that she will be scheduled for TOC, no answer, left VM for callback.

## 2019-08-30 DIAGNOSIS — A749 Chlamydial infection, unspecified: Secondary | ICD-10-CM

## 2019-08-31 MED ORDER — AZITHROMYCIN 500 MG PO TABS: 1000.0000 mg | ORAL_TABLET | Freq: Once | ORAL | 0 refills | Status: AC

## 2019-09-10 ENCOUNTER — Other Ambulatory Visit: Payer: Self-pay

## 2019-09-10 ENCOUNTER — Ambulatory Visit (INDEPENDENT_AMBULATORY_CARE_PROVIDER_SITE_OTHER): Payer: Medicaid Other

## 2019-09-10 ENCOUNTER — Other Ambulatory Visit (HOSPITAL_COMMUNITY)
Admission: RE | Admit: 2019-09-10 | Discharge: 2019-09-10 | Disposition: A | Discharge status: Home / Self Care | Payer: Medicaid Other | Source: Ambulatory Visit | Attending: Obstetrics & Gynecology | Admitting: Obstetrics & Gynecology

## 2019-09-10 DIAGNOSIS — A749 Chlamydial infection, unspecified: Secondary | ICD-10-CM | POA: Diagnosis not present

## 2019-09-10 NOTE — Progress Notes (Signed)
Pt here today for self swab for TOC r/t +chlamydia and trich on 08/20/19.  Pt explained how to obtain self swab and that we will call with abnormal results.  Pt verbalized understanding.    Mel Almond, RN 09/10/19

## 2019-09-10 NOTE — Progress Notes (Signed)
Patient seen and assessed by nursing staff during this encounter. I have reviewed the chart and agree with the documentation and plan.  Glendel Jaggers, MD 09/10/2019 3:00 PM    

## 2019-09-17 ENCOUNTER — Other Ambulatory Visit: Payer: Self-pay | Admitting: Obstetrics and Gynecology

## 2019-09-17 LAB — CERVICOVAGINAL ANCILLARY ONLY
Bacterial Vaginitis (gardnerella): POSITIVE — AB
Candida Glabrata: NEGATIVE
Candida Vaginitis: NEGATIVE
Chlamydia: NEGATIVE
Comment: NEGATIVE
Comment: NEGATIVE
Comment: NEGATIVE
Comment: NEGATIVE
Comment: NEGATIVE
Comment: NORMAL
Neisseria Gonorrhea: NEGATIVE
Trichomonas: NEGATIVE

## 2019-09-17 MED ORDER — METRONIDAZOLE 500 MG PO TABS: 500.0000 mg | ORAL_TABLET | Freq: Two times a day (BID) | ORAL | 0 refills | Status: DC

## 2020-01-03 ENCOUNTER — Ambulatory Visit: Payer: Medicaid Other | Attending: Family Medicine | Admitting: Physician Assistant

## 2020-01-03 ENCOUNTER — Other Ambulatory Visit: Payer: Self-pay

## 2020-01-03 VITALS — BP 111/78 | HR 86 | Temp 97.7°F | Ht 66.0 in | Wt 226.0 lb

## 2020-01-03 DIAGNOSIS — F329 Major depressive disorder, single episode, unspecified: Secondary | ICD-10-CM | POA: Diagnosis not present

## 2020-01-03 DIAGNOSIS — Z79899 Other long term (current) drug therapy: Secondary | ICD-10-CM | POA: Insufficient documentation

## 2020-01-03 DIAGNOSIS — M6740 Ganglion, unspecified site: Secondary | ICD-10-CM | POA: Diagnosis not present

## 2020-01-03 DIAGNOSIS — F419 Anxiety disorder, unspecified: Secondary | ICD-10-CM | POA: Insufficient documentation

## 2020-01-03 DIAGNOSIS — J45909 Unspecified asthma, uncomplicated: Secondary | ICD-10-CM | POA: Insufficient documentation

## 2020-01-03 NOTE — Patient Instructions (Signed)
Ganglion Cyst  A ganglion cyst is a non-cancerous, fluid-filled lump that occurs near a joint or tendon. The cyst grows out of a joint or the lining of a tendon. Ganglion cysts most often develop in the hand or wrist, but they can also develop in the shoulder, elbow, hip, knee, ankle, or foot. Ganglion cysts are ball-shaped or egg-shaped. Their size can range from the size of a pea to larger than a grape. Increased activity may cause the cyst to get bigger because more fluid starts to build up. What are the causes? The exact cause of this condition is not known, but it may be related to:  Inflammation or irritation around the joint.  An injury.  Repetitive movements or overuse.  Arthritis. What increases the risk? You are more likely to develop this condition if:  You are a woman.  You are 15-40 years old. What are the signs or symptoms? The main symptom of this condition is a lump. It most often appears on the hand or wrist. In many cases, there are no other symptoms, but a cyst can sometimes cause:  Tingling.  Pain.  Numbness.  Muscle weakness.  Weak grip.  Less range of motion in a joint. How is this diagnosed? Ganglion cysts are usually diagnosed based on a physical exam. Your health care provider will feel the lump and may shine a light next to it. If it is a ganglion cyst, the light will likely shine through it. Your health care provider may order an X-ray, ultrasound, or MRI to rule out other conditions. How is this treated? Ganglion cysts often go away on their own without treatment. If you have pain or other symptoms, treatment may be needed. Treatment is also needed if the ganglion cyst limits your movement or if it gets infected. Treatment may include:  Wearing a brace or splint on your wrist or finger.  Taking anti-inflammatory medicine.  Having fluid drained from the lump with a needle (aspiration).  Getting a steroid injected into the joint.  Having  surgery to remove the ganglion cyst.  Placing a pad on your shoe or wearing shoes that will not rub against the cyst if it is on your foot. Follow these instructions at home:  Do not press on the ganglion cyst, poke it with a needle, or hit it.  Take over-the-counter and prescription medicines only as told by your health care provider.  If you have a brace or splint: ? Wear it as told by your health care provider. ? Remove it as told by your health care provider. Ask if you need to remove it when you take a shower or a bath.  Watch your ganglion cyst for any changes.  Keep all follow-up visits as told by your health care provider. This is important. Contact a health care provider if:  Your ganglion cyst becomes larger or more painful.  You have pus coming from the lump.  You have weakness or numbness in the affected area.  You have a fever or chills. Get help right away if:  You have a fever and have any of these in the cyst area: ? Increased redness. ? Red streaks. ? Swelling. Summary  A ganglion cyst is a non-cancerous, fluid-filled lump that occurs near a joint or tendon.  Ganglion cysts most often develop in the hand or wrist, but they can also develop in the shoulder, elbow, hip, knee, ankle, or foot.  Ganglion cysts often go away on their own without treatment.   This information is not intended to replace advice given to you by your health care provider. Make sure you discuss any questions you have with your health care provider. Document Revised: 08/26/2017 Document Reviewed: 05/13/2017 Elsevier Patient Education  2020 Elsevier Inc.  

## 2020-01-03 NOTE — Progress Notes (Signed)
Patient ID: Phyllis Phillips, female   DOB: 1991-01-03, 29 y.o.   MRN: [id number]   Phyllis Phillips, is a 29 y.o. female  GEZ:[id number]  YTK:[id number]  DOB - [birth year]  Subjective:  Chief Complaint and HPI: Phyllis Phillips is a 29 y.o. female here today for concern of reaction to TB test and knot on inside L wrist that sometimes swells and causes her finger to draw in. NKI.  TB test was a few days ago and read as negative in the standard interval.  She is concerned bc the area became a little red and "puffy" after the reading window for the test.    She has an 28 month old infant she is nursing.    ROS:   Constitutional:  No f/c, No night sweats, No unexplained weight loss. EENT:  No vision changes, No blurry vision, No hearing changes. No mouth, throat, or ear problems.  Respiratory: No cough, No SOB Cardiac: No CP, no palpitations GI:  No abd pain, No N/V/D. GU: No Urinary s/sx Musculoskeletal: No joint pain Neuro: No headache, no dizziness, no motor weakness.  Skin: No rash Endocrine:  No polydipsia. No polyuria.  Psych: Denies SI/HI  No problems updated.  ALLERGIES: Allergies  Allergen Reactions  . Latex Other (See Comments)    Causes burning.    PAST MEDICAL HISTORY: Past Medical History:  Diagnosis Date  . Anemia   . Anxiety   . Asthma    weather, exercise related; does not use inhaler often  . Depression   . Gestational diabetes   . Infection    UTI  . Recurrent upper respiratory infection (URI)   . Shingles     MEDICATIONS AT HOME: Prior to Admission medications   Medication Sig Start Date End Date Taking? Authorizing Provider  escitalopram (LEXAPRO) 20 MG tablet Take 1 tablet (20 mg total) by mouth daily. 08/20/19  Yes Venora Maples, MD  ibuprofen (ADVIL) 800 MG tablet Take 1 tablet (800 mg total) by mouth 3 (three) times daily. 01/21/19  Yes Rhett Bannister S, DO  metroNIDAZOLE (FLAGYL) 500 MG tablet Take 1 tablet (500 mg total) by mouth 2 (two) times  daily. 09/17/19  Yes Constant, Peggy, MD  albuterol (PROVENTIL HFA;VENTOLIN HFA) 108 (90 Base) MCG/ACT inhaler Inhale 2 puffs into the lungs every 6 (six) hours as needed for wheezing or shortness of breath.    [provider]  cyclobenzaprine (FLEXERIL) 5 MG tablet Take 1 tablet (5 mg total) by mouth 2 (two) times daily as needed for muscle spasms. Patient not taking: Reported on 02/26/2019 10/04/18   Marny Lowenstein, PA-C  ferrous sulfate 325 (65 FE) MG tablet TAKE 1 TABLET (325 MG TOTAL) BY MOUTH 2 (TWO) TIMES DAILY WITH A MEAL. 04/16/19   Rhett Bannister S, DO  pantoprazole (PROTONIX) 40 MG tablet Take 1 tablet (40 mg total) by mouth daily. 10/22/18   Leftwich-Kirby, Wilmer Floor, CNM  Prenatal Vit-Fe Phos-FA-Omega (VITAFOL GUMMIES) 3.33-0.333-34.8 MG CHEW Chew 3 each by mouth daily. Patient not taking: Reported on 02/26/2019 08/02/18   Currie Paris, NP  promethazine (PHENERGAN) 25 MG tablet Take 1 tablet (25 mg total) by mouth every 6 (six) hours as needed for nausea. 10/22/18   Leftwich-Kirby, Wilmer Floor, CNM  senna-docusate (SENOKOT-S) 8.6-50 MG tablet Take 2 tablets by mouth at bedtime as needed for mild constipation. 01/21/19   Tamera Stands, DO     Objective:  EXAM:   Vitals:   01/03/20 0843  BP: 111/78  Pulse: 86  Temp: 97.7 F (36.5 C)  TempSrc: Temporal  SpO2: 95%  Weight: 226 lb (102.5 kg)  Height: 5\' 6"  (1.676 m)    General appearance : A&OX3. NAD. Non-toxic-appearing HEENT: Atraumatic and Normocephalic.  PERRLA. EOM intact.  Chest/Lungs:  Breathing-non-labored, Good air entry bilaterally, breath sounds normal without rales, rhonchi, or wheezing  CVS: S1 S2 regular, no murmurs, gallops, rubs  L volar wrist-<1 cm mobile typical ganglion cyst along proximal flexor tendon sheath.  No TB skin test induration or abnormality Neurology:  CN II-XII grossly intact, Non focal.   Psych:  TP linear. J/I WNL. Normal speech. Appropriate eye contact and affect.  Skin:  No Rash  Data  Review No results found for: HGBA1C   Assessment & Plan   1. Ganglion cyst - Ambulatory referral to Hand Surgery  2. Concern for reaction to TB test-no rash or induration     Patient have been counseled extensively about nutrition and exercise  Return in about 3 months (around 04/03/2020).  The patient was given clear instructions to go to ER or return to medical center if symptoms don't improve, worsen or new problems develop. The patient verbalized understanding. The patient was told to call to get lab results if they haven't heard anything in the next week.     Freeman Caldron, PA-C [facility] and [facility] [place], [place]   01/03/2020, 9:04 AM

## 2020-01-10 ENCOUNTER — Ambulatory Visit: Payer: Medicaid Other | Admitting: Orthopaedic Surgery

## 2020-07-02 ENCOUNTER — Other Ambulatory Visit: Payer: Self-pay

## 2020-07-02 ENCOUNTER — Encounter (HOSPITAL_COMMUNITY): Payer: Self-pay | Admitting: Emergency Medicine

## 2020-07-02 ENCOUNTER — Ambulatory Visit (HOSPITAL_COMMUNITY)
Admission: EM | Admit: 2020-07-02 | Discharge: 2020-07-02 | Disposition: A | Discharge status: Home / Self Care | Payer: Medicaid Other | Attending: Family Medicine | Admitting: Family Medicine

## 2020-07-02 DIAGNOSIS — R519 Headache, unspecified: Secondary | ICD-10-CM | POA: Insufficient documentation

## 2020-07-02 DIAGNOSIS — R208 Other disturbances of skin sensation: Secondary | ICD-10-CM | POA: Diagnosis not present

## 2020-07-02 DIAGNOSIS — Z20822 Contact with and (suspected) exposure to covid-19: Secondary | ICD-10-CM | POA: Insufficient documentation

## 2020-07-02 LAB — SARS CORONAVIRUS 2 (TAT 6-24 HRS): SARS Coronavirus 2: NEGATIVE

## 2020-07-02 NOTE — ED Provider Notes (Signed)
[facility]    CSN: [id number] Arrival date & time: 07/02/20  9937      History   Chief Complaint Chief Complaint  Patient presents with  . Sinus Problem  . Headache    HPI Phyllis Phillips is a 29 y.o. female.   Patient is a 29 year old female presents today for nasal burning, headache.  This started today.  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  No associated fever, chills, cough, chest congestion, loss of taste smell.  Kids sick with similar symptoms     Past Medical History:  Diagnosis Date  . Anemia   . Anxiety   . Asthma    weather, exercise related; does not use inhaler often  . Depression   . Gestational diabetes   . Infection    UTI  . Recurrent upper respiratory infection (URI)   . Shingles     Patient Active Problem List   Diagnosis Date Noted  . GDM (gestational diabetes mellitus) 01/19/2019  . Status post primary low transverse cesarean section 01/19/2019  . Encounter for tubal ligation 01/19/2019  . LGA (large for gestational age) fetus affecting management of mother 01/17/2019  . Gestational diabetes mellitus (GDM) affecting pregnancy, antepartum 11/15/2018  . Anemia 11/02/2018  . Nausea/vomiting in pregnancy 10/21/2018  . Poor weight gain of pregnancy 10/21/2018  . Supervision of other normal pregnancy, antepartum 07/05/2018  . GBS bacteriuria 06/03/2018    Past Surgical History:  Procedure Laterality Date  . CESAREAN SECTION WITH BILATERAL TUBAL LIGATION N/A 01/19/2019   Procedure: CESAREAN SECTION WITH BILATERAL TUBAL LIGATION;  Surgeon: Lazaro Arms, MD;  Location: [facility];  Service: Obstetrics;  Laterality: N/A;  . DILATION AND CURETTAGE OF UTERUS    . HAND TENDON SURGERY    . WISDOM TOOTH EXTRACTION      OB History    Gravida  6   Para  3   Term  3   Preterm      AB  3   Living  3     SAB  1   TAB  2   Ectopic      Multiple  0   Live Births  3            Home Medications      Prior to Admission medications   Medication Sig Start Date End Date Taking? Authorizing Provider  albuterol (PROVENTIL HFA;VENTOLIN HFA) 108 (90 Base) MCG/ACT inhaler Inhale 2 puffs into the lungs every 6 (six) hours as needed for wheezing or shortness of breath.    [provider]  escitalopram (LEXAPRO) 20 MG tablet Take 1 tablet (20 mg total) by mouth daily. 08/20/19   Venora Maples, MD  ferrous sulfate 325 (65 FE) MG tablet TAKE 1 TABLET (325 MG TOTAL) BY MOUTH 2 (TWO) TIMES DAILY WITH A MEAL. 04/16/19   Rhett Bannister S, DO  ibuprofen (ADVIL) 800 MG tablet Take 1 tablet (800 mg total) by mouth 3 (three) times daily. 01/21/19   Tamera Stands, DO  metroNIDAZOLE (FLAGYL) 500 MG tablet Take 1 tablet (500 mg total) by mouth 2 (two) times daily. 09/17/19   Constant, Peggy, MD  Prenatal Vit-Fe Phos-FA-Omega (VITAFOL GUMMIES) 3.33-0.333-34.8 MG CHEW Chew 3 each by mouth daily. Patient not taking: Reported on 02/26/2019 08/02/18   Currie Paris, NP  promethazine (PHENERGAN) 25 MG tablet Take 1 tablet (25 mg total) by mouth every 6 (six) hours as needed for nausea.  10/22/18   Leftwich-Kirby, Wilmer Floor, CNM  senna-docusate (SENOKOT-S) 8.6-50 MG tablet Take 2 tablets by mouth at bedtime as needed for mild constipation. 01/21/19   Tamera Stands, DO    Family History Family History  Problem Relation Age of Onset  . Cancer Mother   . Early death Mother   . Arthritis Father   . Diabetes Father   . Hypertension Father   . Varicose Veins Father     Social History Social History   Tobacco Use  . Smoking status: Current Some Day Smoker  . Smokeless tobacco: Never Used  . Tobacco comment: states quit 16yrs ago  Vaping Use  . Vaping Use: Never used  Substance Use Topics  . Alcohol use: Yes    Comment: rarely  . Drug use: Not Currently    Types: Marijuana    Comment: "long time ago"     Allergies   Latex   Review of Systems Review of Systems   Physical Exam Triage  Vital Signs ED Triage Vitals  Enc Vitals Group     BP 07/02/20 1128 (!) 143/111     Pulse Rate 07/02/20 1127 89     Resp --      Temp 07/02/20 1127 98.8 F (37.1 C)     Temp Source 07/02/20 1127 Oral     SpO2 07/02/20 1127 97 %     Weight --      Height --      Head Circumference --      Peak Flow --      Pain Score --      Pain Loc --      Pain Edu? --      Excl. in GC? --    No data found.  Updated Vital Signs BP (!) 143/111 (BP Location: Right Arm)   Pulse 89   Temp 98.8 F (37.1 C) (Oral)   LMP 06/04/2020   SpO2 97%   Visual Acuity Right Eye Distance:   Left Eye Distance:   Bilateral Distance:    Right Eye Near:   Left Eye Near:    Bilateral Near:     Physical Exam Vitals and nursing note reviewed.  Constitutional:      General: She is not in acute distress.    Appearance: Normal appearance. She is not ill-appearing, toxic-appearing or diaphoretic.  HENT:     Head: Normocephalic.     Nose: Nose normal.  Eyes:     Conjunctiva/sclera: Conjunctivae normal.  Pulmonary:     Effort: Pulmonary effort is normal.  Musculoskeletal:        General: Normal range of motion.     Cervical back: Normal range of motion.  Skin:    General: Skin is warm and dry.     Findings: No rash.  Neurological:     Mental Status: She is alert.  Psychiatric:        Mood and Affect: Mood normal.      UC Treatments / Results  Labs (all labs ordered are listed, but only abnormal results are displayed) Labs Reviewed  SARS CORONAVIRUS 2 (TAT 6-24 HRS)    EKG   Radiology No results found.  Procedures Procedures (including critical care time)  Medications Ordered in UC Medications - No data to display  Initial Impression / Assessment and Plan / UC Course  I have reviewed the triage vital signs and the nursing notes.  Pertinent labs & imaging results that were available during my care  of the patient were reviewed by me and considered in my medical decision making  (see chart for details).     Nasal burning headache Covid swab pending. Recommend over-the-counter medicines as needed.  Quarantine to get the Covid results. Follow up as needed for continued or worsening symptoms  Final Clinical Impressions(s) / UC Diagnoses   Final diagnoses:  Nasal burning     Discharge Instructions     Covid swab pending.  OTC medicines as needed Quarantine until we get the results.  Follow up as needed for continued or worsening symptoms     ED Prescriptions    None     PDMP not reviewed this encounter.   Janace Aris, NP 07/02/20 1153

## 2020-07-02 NOTE — ED Triage Notes (Signed)
Pt c/o headache and sinus pain onset today. 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 Mother states her nose is burning.

## 2020-07-02 NOTE — Discharge Instructions (Signed)
Covid swab pending.  OTC medicines as needed Quarantine until we get the results.  Follow up as needed for continued or worsening symptoms

## 2020-07-14 IMAGING — US US OB TRANSVAGINAL
1 series · 15 of 26 positions shown · non-contrast
Comparison: None for this gestation

CLINICAL DATA: Abdominal pain during first trimester of pregnancy

EXAM:
TRANSVAGINAL OB ULTRASOUND
TECHNIQUE: Transvaginal ultrasound was performed for complete evaluation of the
gestation as well as the maternal uterus, adnexal regions, and
pelvic cul-de-sac.

[Series 1: us ob transvaginal · 26 acquisitions, 15 frames shown]
[im 1/26]
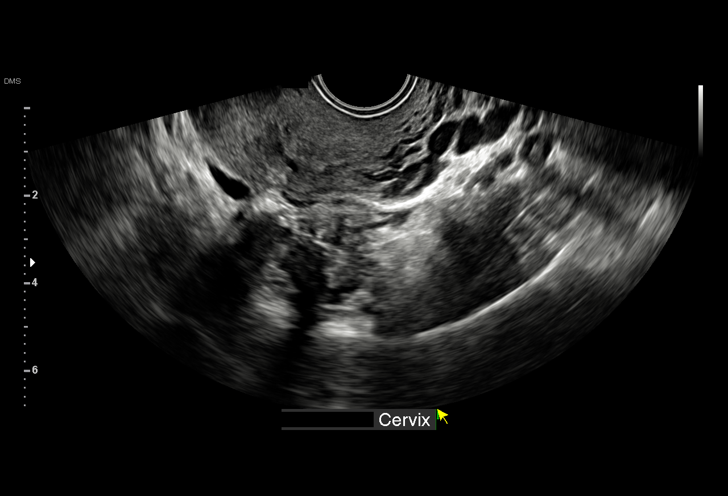
[im 3/26]
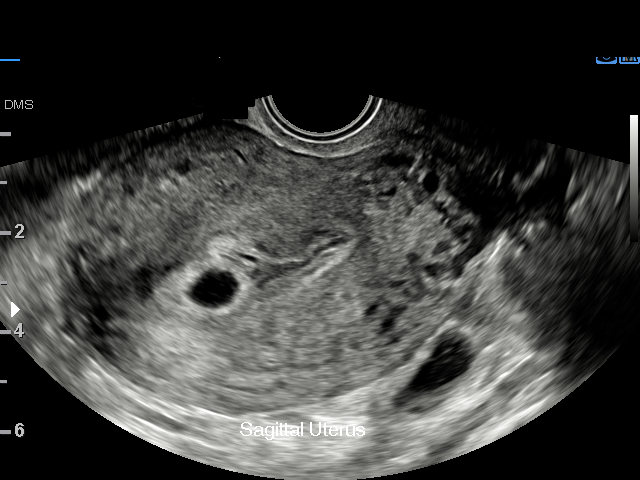
[im 5/26]
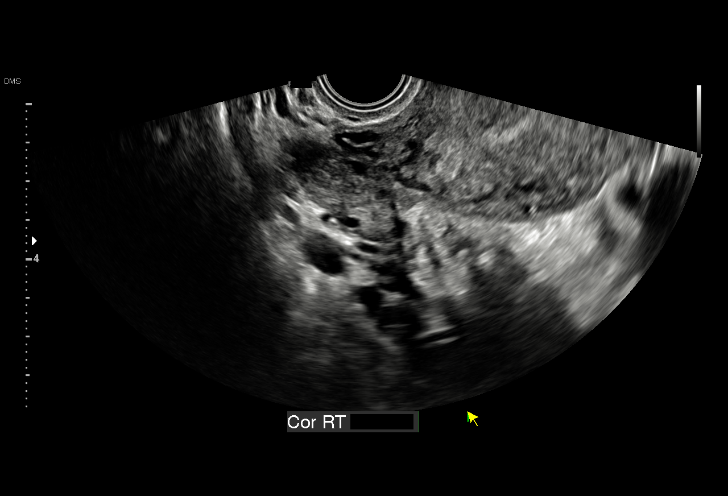
[im 7/26]
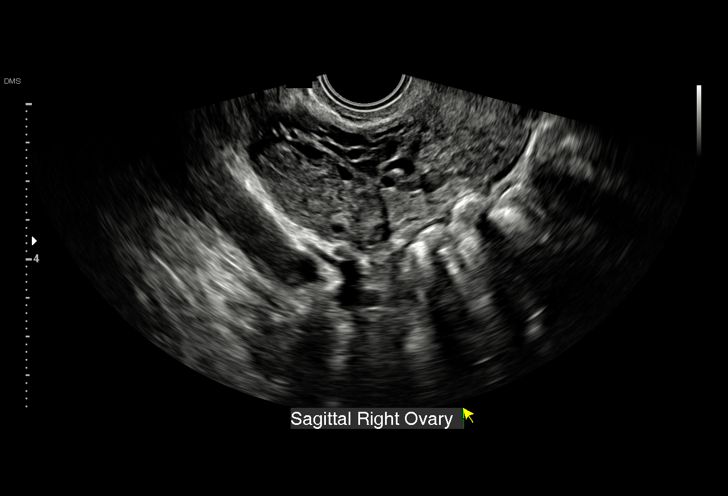
[im 8/26]
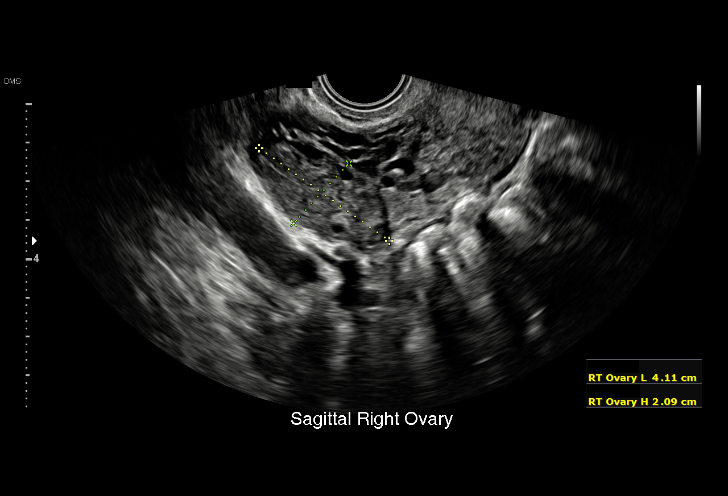
[im 10/26]
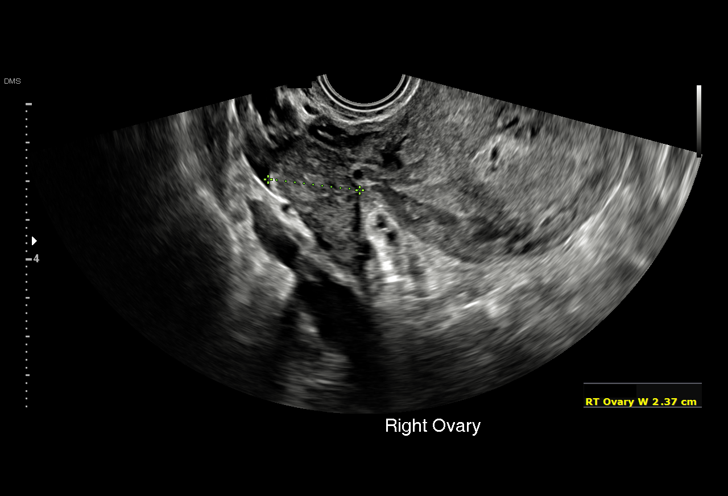
[im 12/26]
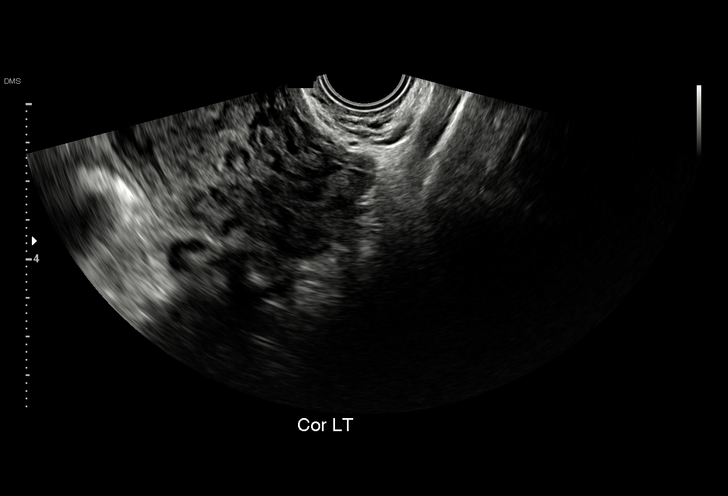
[im 14/26]
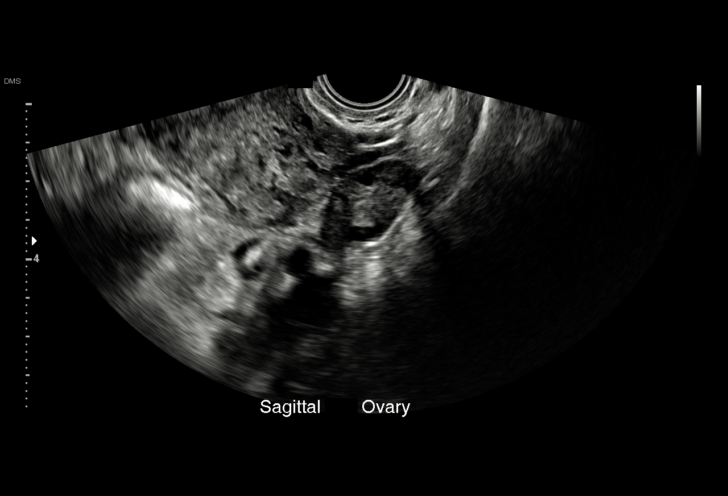
[im 15/26]
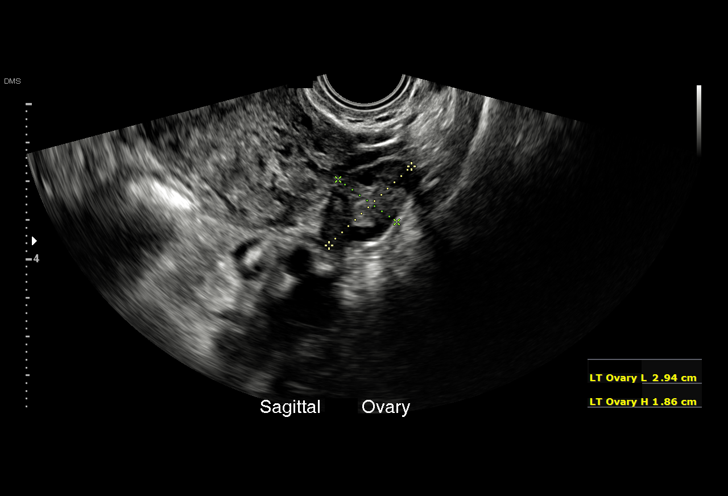
[im 17/26]
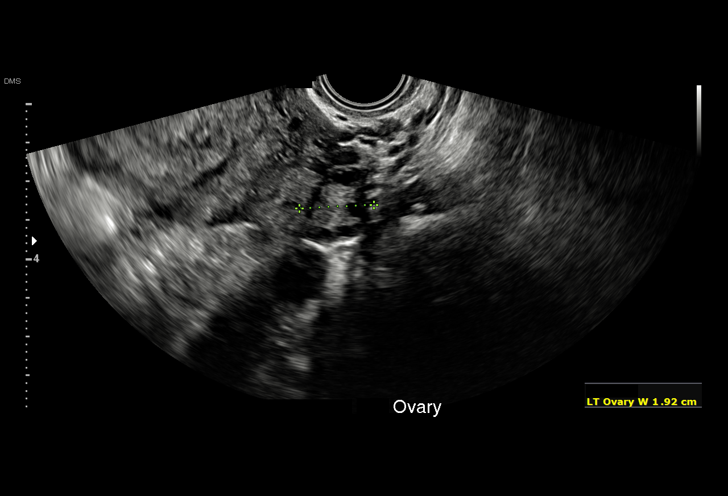
[im 19/26]
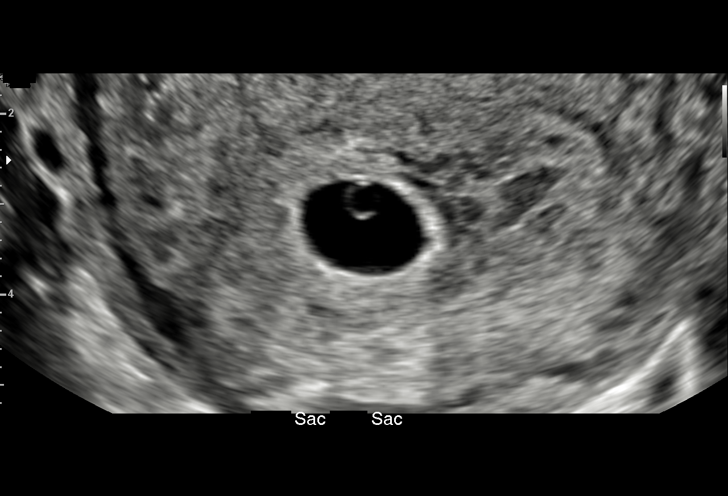
[im 20/26]
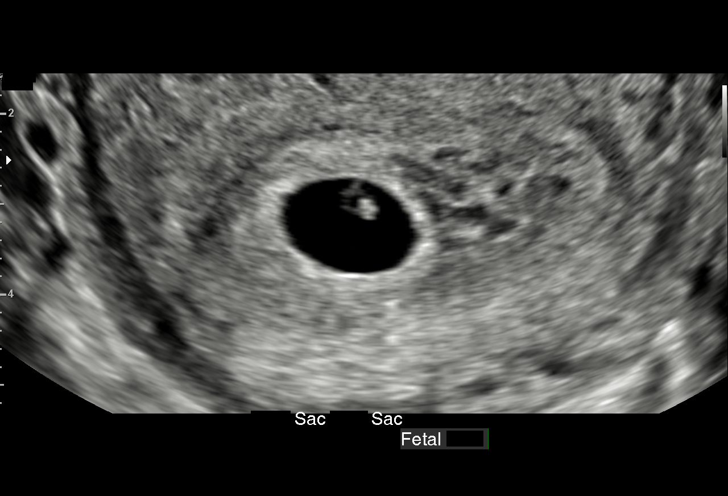
[im 22/26]
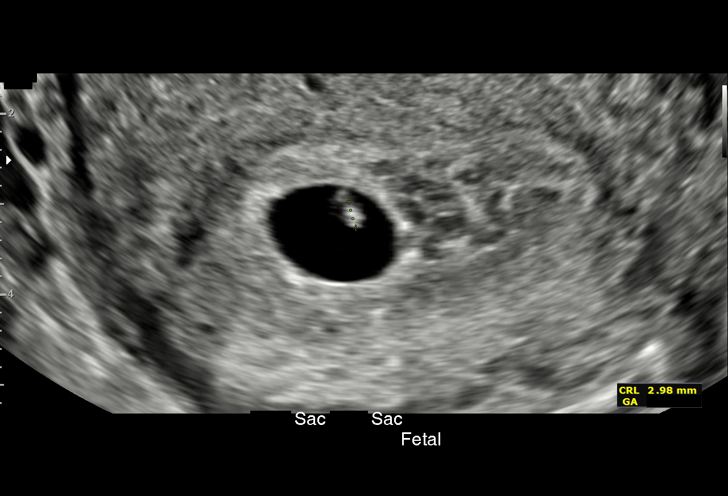
[im 24/26]
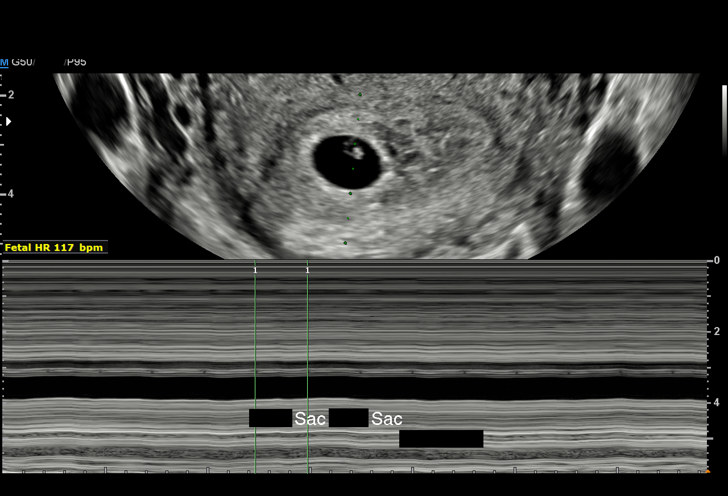
[im 26/26]
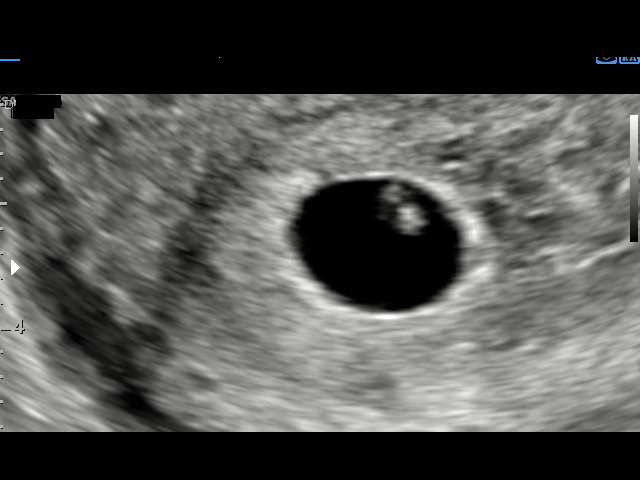

[15 of 26 positions shown; findings below may reference images not displayed]

FINDINGS: Intrauterine gestational sac: Present, single

Yolk sac:  Present

Embryo:  Present

Cardiac Activity: Present

Heart Rate: 116 bpm

CRL:   2.8 mm   5 w 5 d                  US EDC: 01/26/2019

Subchorionic hemorrhage:  None visualized.

Maternal uterus/adnexae:

LEFT ovary normal size and morphology, 2.9 x 1.9 x 1.9 cm.

RIGHT ovary normal size and morphology, 4.1 x 2.1 x 2.4 cm.

No adnexal masses or free pelvic fluid.
IMPRESSION: Single live intrauterine gestation at 5 weeks 5 days EGA by
crown-rump length as above.

No acute abnormalities.

## 2020-07-17 ENCOUNTER — Other Ambulatory Visit: Payer: Self-pay

## 2020-07-17 ENCOUNTER — Encounter: Payer: Self-pay | Admitting: Family Medicine

## 2020-07-17 ENCOUNTER — Ambulatory Visit: Payer: Medicaid Other | Attending: Family Medicine | Admitting: Family Medicine

## 2020-07-17 DIAGNOSIS — F419 Anxiety disorder, unspecified: Secondary | ICD-10-CM

## 2020-07-17 DIAGNOSIS — R5383 Other fatigue: Secondary | ICD-10-CM | POA: Diagnosis not present

## 2020-07-17 DIAGNOSIS — F32A Depression, unspecified: Secondary | ICD-10-CM | POA: Diagnosis not present

## 2020-07-17 DIAGNOSIS — Z8632 Personal history of gestational diabetes: Secondary | ICD-10-CM

## 2020-07-17 NOTE — Progress Notes (Signed)
Virtual Visit via Telephone Note  I connected with Jeoffrey Massed, on 07/17/2020 at 8:31 AM by telephone due to the COVID-19 pandemic and verified that I am speaking with the correct person using two identifiers.   Consent: I discussed the limitations, risks, security and privacy concerns of performing an evaluation and management service by telephone and the availability of in person appointments. I also discussed with the patient that there may be a patient responsible charge related to this service. The patient expressed understanding and agreed to proceed.   Location of Patient: Home  Location of Provider: Clinic   Persons participating in Telemedicine visit: Gianina Olinde Farrington-CMA Dr. Alvis Lemmings     History of Present Illness: Dion Body is a 29 year old female with a history of anxiety and depression, gestational diabetes mellitus who presents today to establish care.  She complains of feeling drained. Can wake up at 6am and by 10 am she is tired and sleepy even though she goes to bed at 8pm. Also gaining weight -about 40lbs in the last year and when she walks a short distance she is tired.  Denies pedal edema, paroxysmal nocturnal dyspnea or orthopnea. She had a baby in 12/2018 and weight gain started after she had the baby but during her pregnancy she did not gain much weight.  Initially attributed weight gain to the pandemic but now she feels that this is out of the ordinary. Endorses cold intolerance; sometime has hot flashes.  She is currently on Lexapro for anxiety and depression and states she feels good hence she has been stretching her Lexapro and taking this every other day. Currently takes iron pills due to the fact that she was anemic postpartum. Past Medical History:  Diagnosis Date  . Anemia   . Anxiety   . Asthma    weather, exercise related; does not use inhaler often  . Depression   . Gestational diabetes   . Infection    UTI  . Recurrent  upper respiratory infection (URI)   . Shingles    Allergies  Allergen Reactions  . Latex Other (See Comments)    Causes burning.    Current Outpatient Medications on File Prior to Visit  Medication Sig Dispense Refill  . albuterol (PROVENTIL HFA;VENTOLIN HFA) 108 (90 Base) MCG/ACT inhaler Inhale 2 puffs into the lungs every 6 (six) hours as needed for wheezing or shortness of breath.    . escitalopram (LEXAPRO) 20 MG tablet Take 1 tablet (20 mg total) by mouth daily. 30 tablet 5  . ferrous sulfate 325 (65 FE) MG tablet TAKE 1 TABLET (325 MG TOTAL) BY MOUTH 2 (TWO) TIMES DAILY WITH A MEAL. 60 tablet 0  . ibuprofen (ADVIL) 800 MG tablet Take 1 tablet (800 mg total) by mouth 3 (three) times daily. (Patient not taking: Reported on 07/17/2020) 30 tablet 0  . metroNIDAZOLE (FLAGYL) 500 MG tablet Take 1 tablet (500 mg total) by mouth 2 (two) times daily. (Patient not taking: Reported on 07/17/2020) 14 tablet 0  . Prenatal Vit-Fe Phos-FA-Omega (VITAFOL GUMMIES) 3.33-0.333-34.8 MG CHEW Chew 3 each by mouth daily. (Patient not taking: Reported on 02/26/2019) 90 tablet 11  . promethazine (PHENERGAN) 25 MG tablet Take 1 tablet (25 mg total) by mouth every 6 (six) hours as needed for nausea. 30 tablet 3  . senna-docusate (SENOKOT-S) 8.6-50 MG tablet Take 2 tablets by mouth at bedtime as needed for mild constipation. 20 tablet 0   No current facility-administered medications on file prior to visit.  Observations/Objective: Awake, alert, oriented x3 Not in acute distress  CBC    Component Value Date/Time   WBC 11.7 (H) 01/20/2019 0719   RBC 3.29 (L) 01/20/2019 0719   HGB 7.8 (L) 01/20/2019 0719   HGB 10.0 (L) 11/01/2018 0951   HCT 25.8 (L) 01/20/2019 0719   HCT 30.1 (L) 11/01/2018 0951   PLT 211 01/20/2019 0719   PLT 282 11/01/2018 0951   MCV 78.4 (L) 01/20/2019 0719   MCV 81 11/01/2018 0951   MCH 23.7 (L) 01/20/2019 0719   MCHC 30.2 01/20/2019 0719   RDW 15.8 (H) 01/20/2019 0719   RDW  13.8 11/01/2018 0951   LYMPHSABS 1.8 07/05/2018 1232   MONOABS 0.4 05/31/2018 1620   EOSABS 0.0 07/05/2018 1232   BASOSABS 0.0 07/05/2018 1232    Assessment and Plan: 1. Anxiety and depression Controlled She is probably in remission and has been advised she can discontinue Lexapro If symptoms resume, she is to notify me and I will reinstate her Lexapro  2. Other fatigue History of anemia currently on iron pills We will check CBC We will also need to rule out thyroid disorder and vitamin D deficiency If this labs are negative I will consider ordering a sleep study to exclude obstructive sleep apnea - CBC with Differential/Platelet; Future - T4, free; Future - TSH; Future - VITAMIN D 25 Hydroxy (Vit-D Deficiency, Fractures); Future  3. History of gestational diabetes - Hemoglobin A1c; Future - Lipid panel; Future   Follow Up Instructions: Return if symptoms worsen or fail to improve.    I discussed the assessment and treatment plan with the patient. The patient was provided an opportunity to ask questions and all were answered. The patient agreed with the plan and demonstrated an understanding of the instructions.   The patient was advised to call back or seek an in-person evaluation if the symptoms worsen or if the condition fails to improve as anticipated.     I provided 15 minutes total of non-face-to-face time during this encounter including median intraservice time, reviewing previous notes, investigations, ordering medications, medical decision making, coordinating care and patient verbalized understanding at the end of the visit.     Hoy Register, MD, FAAFP. [facility] [place], [place] [phone]   07/17/2020, 8:31 AM

## 2020-07-17 NOTE — Progress Notes (Signed)
Patient will be establishing care today.  States that she may have thyroid problems.  States that she is tired most of the day.

## 2020-07-18 ENCOUNTER — Other Ambulatory Visit: Payer: Self-pay

## 2020-07-18 ENCOUNTER — Ambulatory Visit: Payer: Medicaid Other | Attending: Family Medicine

## 2020-07-18 DIAGNOSIS — Z8632 Personal history of gestational diabetes: Secondary | ICD-10-CM

## 2020-07-18 DIAGNOSIS — R5383 Other fatigue: Secondary | ICD-10-CM

## 2020-07-19 LAB — CBC WITH DIFFERENTIAL/PLATELET
Basophils Absolute: 0 10*3/uL (ref 0.0–0.2)
Basos: 1 %
EOS (ABSOLUTE): 0.1 10*3/uL (ref 0.0–0.4)
Eos: 2 %
Hematocrit: 37.9 % (ref 34.0–46.6)
Hemoglobin: 12.2 g/dL (ref 11.1–15.9)
Immature Grans (Abs): 0 10*3/uL (ref 0.0–0.1)
Immature Granulocytes: 0 %
Lymphocytes Absolute: 2.9 10*3/uL (ref 0.7–3.1)
Lymphs: 44 %
MCH: 26.9 pg (ref 26.6–33.0)
MCHC: 32.2 g/dL (ref 31.5–35.7)
MCV: 84 fL (ref 79–97)
Monocytes Absolute: 0.4 10*3/uL (ref 0.1–0.9)
Monocytes: 6 %
Neutrophils Absolute: 3 10*3/uL (ref 1.4–7.0)
Neutrophils: 47 %
Platelets: 267 10*3/uL (ref 150–450)
RBC: 4.53 x10E6/uL (ref 3.77–5.28)
RDW: 13.4 % (ref 11.7–15.4)
WBC: 6.5 10*3/uL (ref 3.4–10.8)

## 2020-07-19 LAB — TSH: TSH: 1.46 u[IU]/mL (ref 0.450–4.500)

## 2020-07-19 LAB — LIPID PANEL
Chol/HDL Ratio: 4.6 ratio — ABNORMAL HIGH (ref 0.0–4.4)
Cholesterol, Total: 174 mg/dL (ref 100–199)
HDL: 38 mg/dL — ABNORMAL LOW (ref >39)
LDL Chol Calc (NIH): 105 mg/dL — ABNORMAL HIGH (ref 0–99)
Triglycerides: 177 mg/dL — ABNORMAL HIGH (ref 0–149)
VLDL Cholesterol Cal: 31 mg/dL (ref 5–40)

## 2020-07-19 LAB — T4, FREE: Free T4: 1 ng/dL (ref 0.82–1.77)

## 2020-07-19 LAB — VITAMIN D 25 HYDROXY (VIT D DEFICIENCY, FRACTURES): Vit D, 25-Hydroxy: 23 ng/mL — ABNORMAL LOW (ref 30.0–100.0)

## 2020-07-19 LAB — HEMOGLOBIN A1C
Est. average glucose Bld gHb Est-mCnc: 126 mg/dL
Hgb A1c MFr Bld: 6 % — ABNORMAL HIGH (ref 4.8–5.6)

## 2020-10-16 ENCOUNTER — Other Ambulatory Visit: Payer: Self-pay

## 2020-10-16 ENCOUNTER — Other Ambulatory Visit: Payer: Medicaid Other

## 2020-10-16 DIAGNOSIS — Z20822 Contact with and (suspected) exposure to covid-19: Secondary | ICD-10-CM

## 2020-10-18 LAB — SARS-COV-2, NAA 2 DAY TAT

## 2020-10-18 LAB — NOVEL CORONAVIRUS, NAA: SARS-CoV-2, NAA: NOT DETECTED

## 2020-10-30 ENCOUNTER — Emergency Department (HOSPITAL_COMMUNITY)
Admission: EM | Admit: 2020-10-30 | Discharge: 2020-10-30 | Disposition: A | Discharge status: Home / Self Care | Payer: Medicaid Other | Attending: Emergency Medicine | Admitting: Emergency Medicine

## 2020-10-30 ENCOUNTER — Other Ambulatory Visit: Payer: Self-pay

## 2020-10-30 ENCOUNTER — Encounter (HOSPITAL_COMMUNITY): Payer: Self-pay | Admitting: *Deleted

## 2020-10-30 DIAGNOSIS — Z91040 Latex allergy status: Secondary | ICD-10-CM | POA: Diagnosis not present

## 2020-10-30 DIAGNOSIS — K047 Periapical abscess without sinus: Secondary | ICD-10-CM | POA: Diagnosis not present

## 2020-10-30 DIAGNOSIS — J45909 Unspecified asthma, uncomplicated: Secondary | ICD-10-CM | POA: Insufficient documentation

## 2020-10-30 DIAGNOSIS — K0889 Other specified disorders of teeth and supporting structures: Secondary | ICD-10-CM | POA: Diagnosis present

## 2020-10-30 DIAGNOSIS — F17200 Nicotine dependence, unspecified, uncomplicated: Secondary | ICD-10-CM | POA: Diagnosis not present

## 2020-10-30 MED ORDER — LIDOCAINE VISCOUS HCL 2 % MT SOLN: 15.0000 mL | OROMUCOSAL | 0 refills | Status: DC | PRN

## 2020-10-30 MED ORDER — NAPROXEN 500 MG PO TABS: 500.0000 mg | ORAL_TABLET | Freq: Two times a day (BID) | ORAL | 0 refills | Status: DC

## 2020-10-30 MED ORDER — AMOXICILLIN-POT CLAVULANATE 875-125 MG PO TABS: 1.0000 | ORAL_TABLET | Freq: Two times a day (BID) | ORAL | 0 refills | Status: DC

## 2020-10-30 MED ORDER — LIDOCAINE VISCOUS HCL 2 % MT SOLN
15.0000 mL | Freq: Once | OROMUCOSAL | Status: AC
  Administered 2020-10-30: 15 mL via OROMUCOSAL
  Filled 2020-10-30: qty 15

## 2020-10-30 NOTE — ED Notes (Signed)
Patient verbalizes understanding of discharge instructions. Opportunity for questioning and answers were provided. Armband removed by staff, pt discharged from ED.  

## 2020-10-30 NOTE — ED Triage Notes (Signed)
The pt has a broken tooth  She has had severe pain for 2-3 days    lmp last week

## 2020-10-30 NOTE — Discharge Instructions (Addendum)
Take naproxen 2 times a day with meals.  Do not take other anti-inflammatories at the same time (Advil, Motrin, ibuprofen, Aleve). You may supplement with Tylenol if you need further pain control. Take antibiotics as prescribed.  Take the entire course, even if symptoms improve. Use the viscous lidocaine to help with pain control. It is extremely important that you follow-up with a dentist, as this may problem may recur.  There is a clinic listed below that she may contact, and there is information about other clinics in the paperwork.  If you are unable to make an appointment, you may contact your PCP who should be able to help direct you to appropriate resources. Return to the emergency room with any new, worsening, concerning symptoms.

## 2020-10-30 NOTE — ED Provider Notes (Signed)
[facility] EMERGENCY DEPARTMENT Provider Note   CSN: [id number] Arrival date & time: 10/30/20  0327     History Chief Complaint  Patient presents with  . Dental Pain    Phyllis Phillips is a 30 y.o. female presenting for evaluation of dental pain.  Patient states the past 2 to 3 days, she has had pain of her left upper tooth.  About a month ago, she was eating something when she felt the tooth cracked.  She had 2 weeks of pain, but it resolved without intervention.  She has been pain-free until recently.  The past 2 days, she reports gradually worsening pain, radiates to her head.  She has been taking BC powders without improvement symptoms.  Is not taking anything else.  No fevers, chills, nausea, vomiting.  She does not currently have a dentist.  No previous history of problems with this tooth.  She denies difficulty opening her mouth or swallowing.  She has no significant medical problems.  Pain is constant, worse when she eats.  Nothing makes it better  HPI     Past Medical History:  Diagnosis Date  . Anemia   . Anxiety   . Asthma    weather, exercise related; does not use inhaler often  . Depression   . Gestational diabetes   . Infection    UTI  . Recurrent upper respiratory infection (URI)   . Shingles     Patient Active Problem List   Diagnosis Date Noted  . GDM (gestational diabetes mellitus) 01/19/2019  . Status post primary low transverse cesarean section 01/19/2019  . Encounter for tubal ligation 01/19/2019  . LGA (large for gestational age) fetus affecting management of mother 01/17/2019  . Gestational diabetes mellitus (GDM) affecting pregnancy, antepartum 11/15/2018  . Anemia 11/02/2018  . Nausea/vomiting in pregnancy 10/21/2018  . Poor weight gain of pregnancy 10/21/2018  . Supervision of other normal pregnancy, antepartum 07/05/2018  . GBS bacteriuria 06/03/2018    Past Surgical History:  Procedure Laterality Date  . CESAREAN SECTION  WITH BILATERAL TUBAL LIGATION N/A 01/19/2019   Procedure: CESAREAN SECTION WITH BILATERAL TUBAL LIGATION;  Surgeon: Lazaro Arms, MD;  Location: [facility];  Service: Obstetrics;  Laterality: N/A;  . DILATION AND CURETTAGE OF UTERUS    . HAND TENDON SURGERY    . WISDOM TOOTH EXTRACTION       OB History    Gravida  6   Para  3   Term  3   Preterm      AB  3   Living  3     SAB  1   IAB  2   Ectopic      Multiple  0   Live Births  3           Family History  Problem Relation Age of Onset  . Cancer Mother   . Early death Mother   . Arthritis Father   . Diabetes Father   . Hypertension Father   . Varicose Veins Father     Social History   Tobacco Use  . Smoking status: Current Some Day Smoker  . Smokeless tobacco: Never Used  . Tobacco comment: states quit 36yrs ago  Vaping Use  . Vaping Use: Never used  Substance Use Topics  . Alcohol use: Yes    Comment: rarely  . Drug use: Not Currently    Types: Marijuana    Comment: "long time ago"    Home  Medications Prior to Admission medications   Medication Sig Start Date End Date Taking? Authorizing Provider  amoxicillin-clavulanate (AUGMENTIN) 875-125 MG tablet Take 1 tablet by mouth every 12 (twelve) hours. 10/30/20  Yes Angelyse Heslin, PA-C  lidocaine (XYLOCAINE) 2 % solution Use as directed 15 mLs in the mouth or throat as needed for mouth pain. 10/30/20  Yes Terea Neubauer, PA-C  naproxen (NAPROSYN) 500 MG tablet Take 1 tablet (500 mg total) by mouth 2 (two) times daily with a meal. 10/30/20  Yes Rollen Selders, PA-C  albuterol (PROVENTIL HFA;VENTOLIN HFA) 108 (90 Base) MCG/ACT inhaler Inhale 2 puffs into the lungs every 6 (six) hours as needed for wheezing or shortness of breath.    [provider]  escitalopram (LEXAPRO) 20 MG tablet Take 1 tablet (20 mg total) by mouth daily. 08/20/19   Venora Maples, MD  ferrous sulfate 325 (65 FE) MG tablet TAKE 1 TABLET (325 MG TOTAL) BY MOUTH 2  (TWO) TIMES DAILY WITH A MEAL. 04/16/19   Rhett Bannister S, DO  ibuprofen (ADVIL) 800 MG tablet Take 1 tablet (800 mg total) by mouth 3 (three) times daily. Patient not taking: Reported on 07/17/2020 01/21/19   Tamera Stands, DO  metroNIDAZOLE (FLAGYL) 500 MG tablet Take 1 tablet (500 mg total) by mouth 2 (two) times daily. Patient not taking: Reported on 07/17/2020 09/17/19   Constant, Peggy, MD  Prenatal Vit-Fe Phos-FA-Omega (VITAFOL GUMMIES) 3.33-0.333-34.8 MG CHEW Chew 3 each by mouth daily. Patient not taking: Reported on 02/26/2019 08/02/18   Currie Paris, NP  promethazine (PHENERGAN) 25 MG tablet Take 1 tablet (25 mg total) by mouth every 6 (six) hours as needed for nausea. 10/22/18   Leftwich-Kirby, Wilmer Floor, CNM  senna-docusate (SENOKOT-S) 8.6-50 MG tablet Take 2 tablets by mouth at bedtime as needed for mild constipation. 01/21/19   Tamera Stands, DO    Allergies    Latex  Review of Systems   Review of Systems  Constitutional: Negative for fever.  HENT: Positive for dental problem.     Physical Exam Updated Vital Signs Ht 5\' 6"  (1.676 m)   Wt 102.5 kg   LMP 10/23/2020   BMI 36.47 kg/m   Physical Exam Vitals and nursing note reviewed.  Constitutional:      General: She is not in acute distress.    Appearance: She is well-developed and well-nourished.  HENT:     Head: Normocephalic and atraumatic.     Mouth/Throat:      Comments: Tooth is cracked. Ttp. Surrounding gum edema. No trismus. No difficulty handling secretions.  Eyes:     Extraocular Movements: EOM normal.  Pulmonary:     Effort: Pulmonary effort is normal.  Abdominal:     General: There is no distension.  Musculoskeletal:        General: Normal range of motion.     Cervical back: Normal range of motion.  Skin:    General: Skin is warm.     Findings: No rash.  Neurological:     Mental Status: She is alert and oriented to person, place, and time.  Psychiatric:        Mood and Affect: Mood and  affect normal.     ED Results / Procedures / Treatments   Labs (all labs ordered are listed, but only abnormal results are displayed) Labs Reviewed - No data to display  EKG None  Radiology No results found.  Procedures Procedures   Medications Ordered in ED Medications  lidocaine (XYLOCAINE) 2 % viscous mouth solution 15 mL (has no administration in time range)    ED Course  I have reviewed the triage vital signs and the nursing notes.  Pertinent labs & imaging results that were available during my care of the patient were reviewed by me and considered in my medical decision making (see chart for details).    MDM Rules/Calculators/A&P                          Patient presenting for evaluation of dental pain.  On exam, patient is nontoxic.  No systemic signs of infection including fever or tachycardia.  No trismus.  No signs of concerning deep space infection or Ludwig's angina.  Pain is reproducible with palpation of the tooth, swelling, edema.  As she first developed pain while eating a month ago that then resolved, patient likely cracked her tooth at that time and currently has a diabetic infection.  I discussed with patient.  Discussed treatment antibiotics and anti-inflammatories.  Encourage follow-up with dentistry.  At this time, patient appears safe for discharge.  Return precautions given.  Patient states she understands and agrees to plan.   Final Clinical Impression(s) / ED Diagnoses Final diagnoses:  Pain, dental  Dental infection    Rx / DC Orders ED Discharge Orders         Ordered    amoxicillin-clavulanate (AUGMENTIN) 875-125 MG tablet  Every 12 hours        10/30/20 0744    naproxen (NAPROSYN) 500 MG tablet  2 times daily with meals        10/30/20 0744    lidocaine (XYLOCAINE) 2 % solution  As needed        10/30/20 0744           Alveria Apley, PA-C 10/30/20 8546    Pricilla Loveless, MD 11/01/20 1002

## 2020-12-04 IMAGING — US US ABDOMEN LIMITED
1 series · 15 of 25 positions shown · non-contrast
Comparison: 10/11/2016

CLINICAL DATA: Upper mid abdominal pain.  Elevated lipase.

EXAM:
ULTRASOUND ABDOMEN LIMITED RIGHT UPPER QUADRANT

[Series 1: us abdomen limited · 15 of 54 slices shown]
[im 1/54]
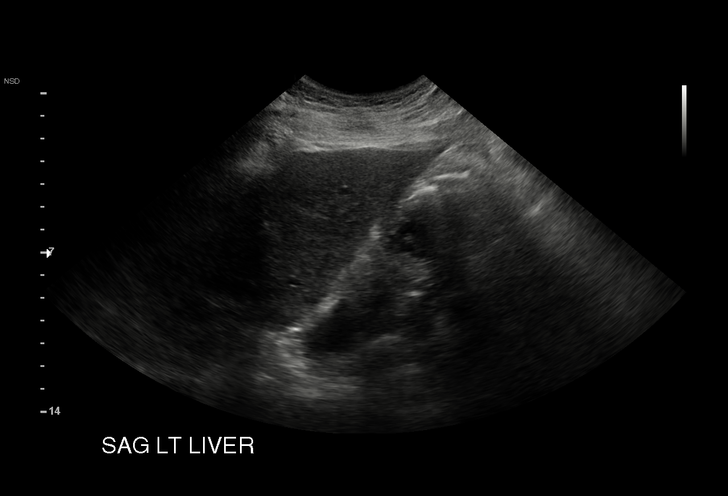
[im 5/54]
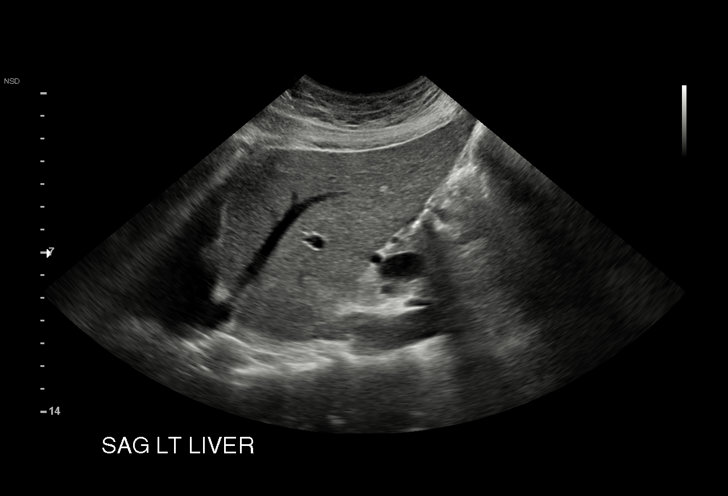
[im 9/54]
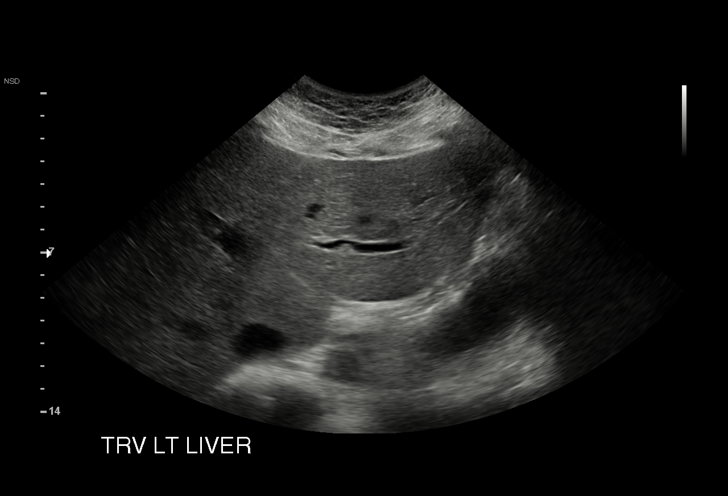
[im 12/54]
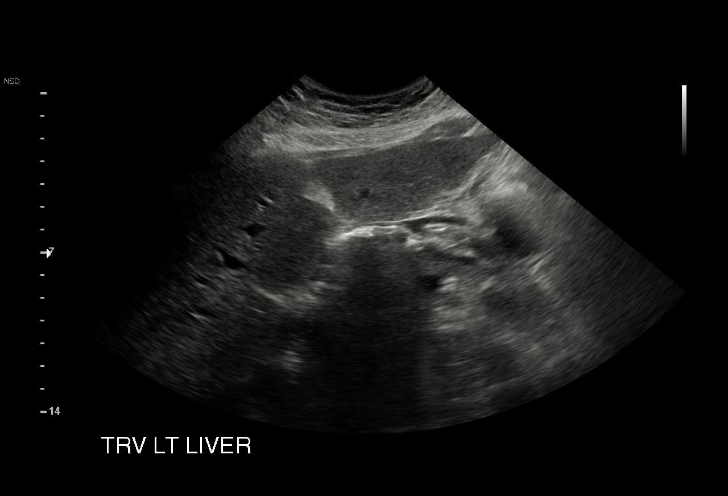
[im 16/54]
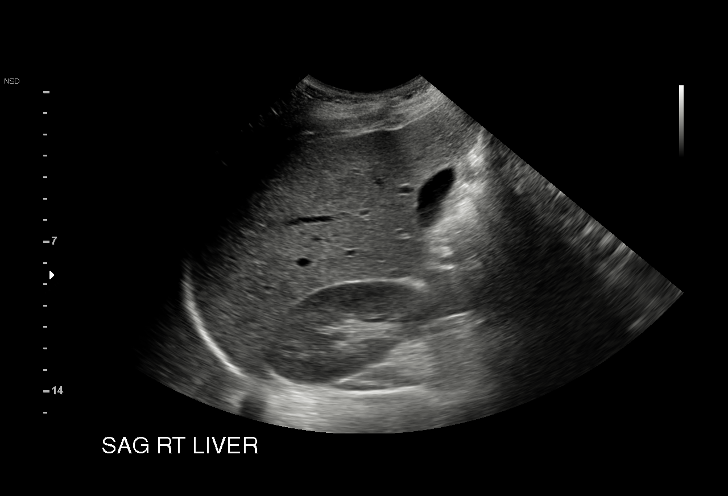
[im 20/54]
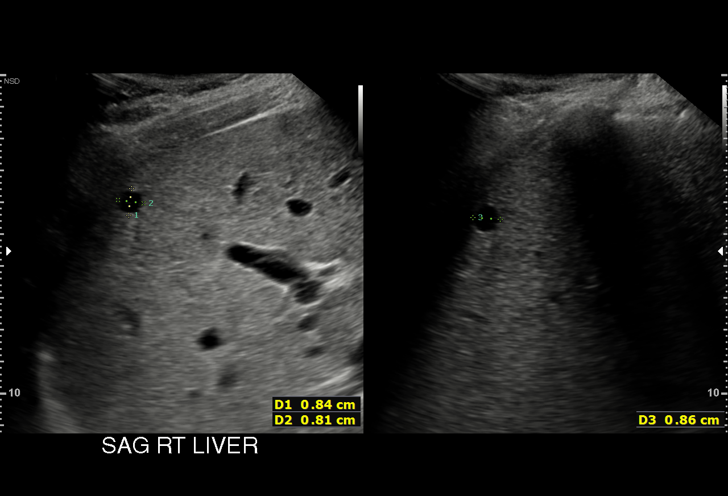
[im 23/54]
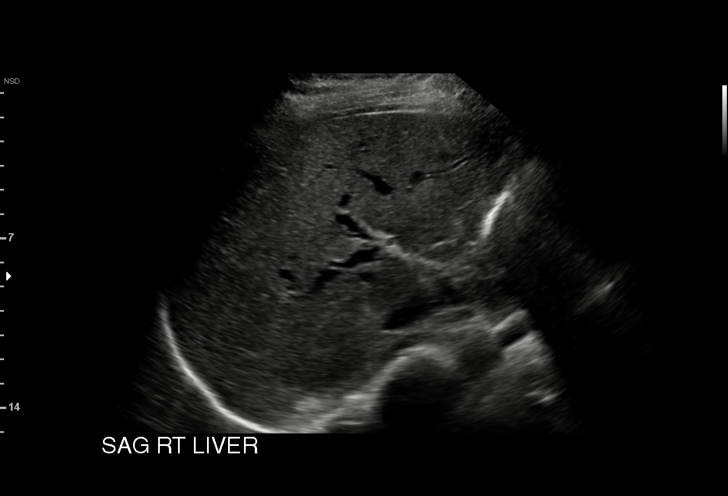
[im 27/54]
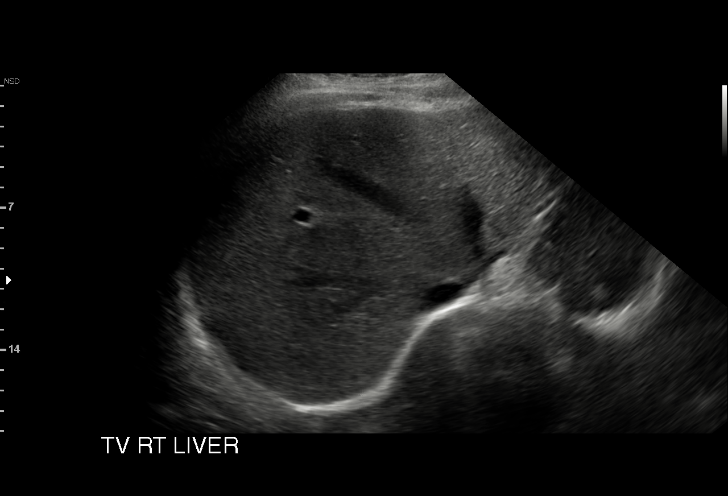
[im 31/54]
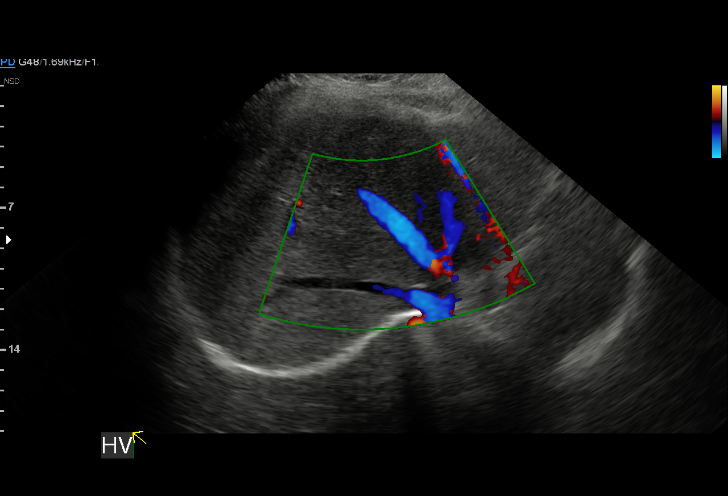
[im 34/54]
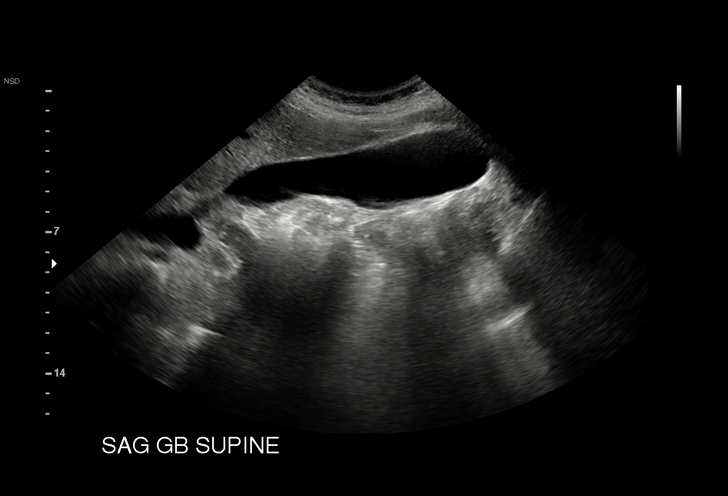
[im 38/54]
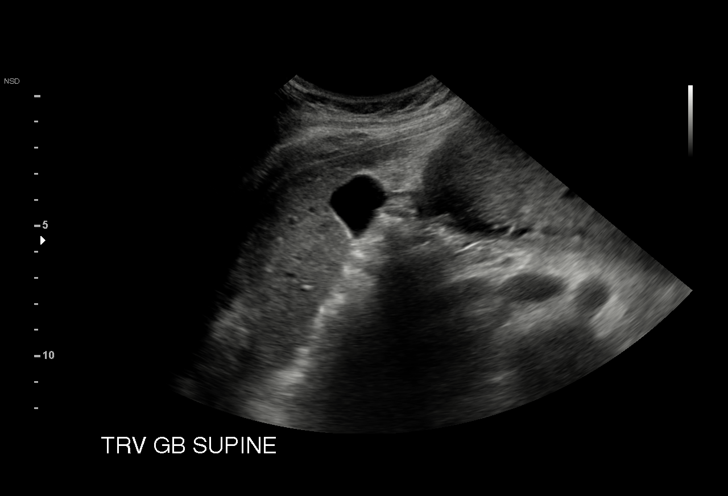
[im 42/54]
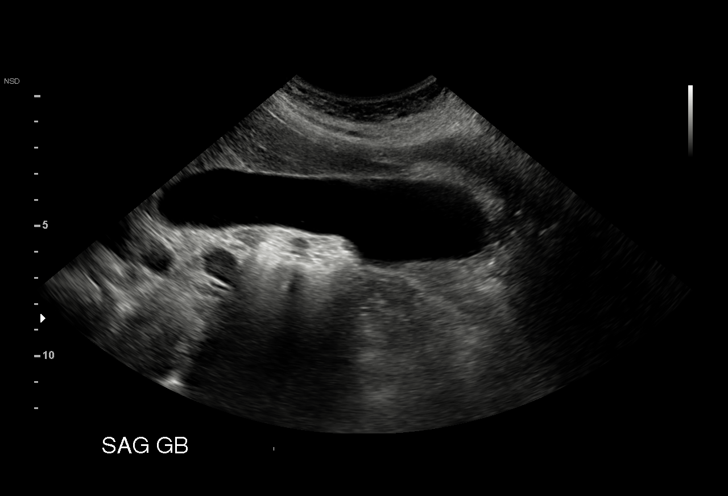
[im 45/54]
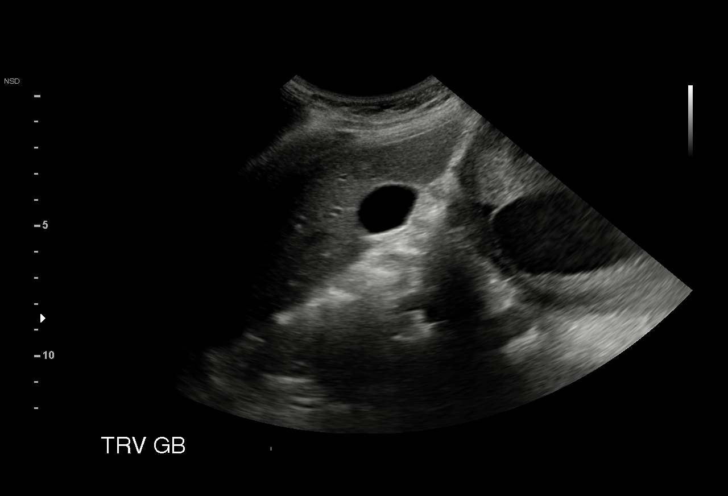
[im 49/54]
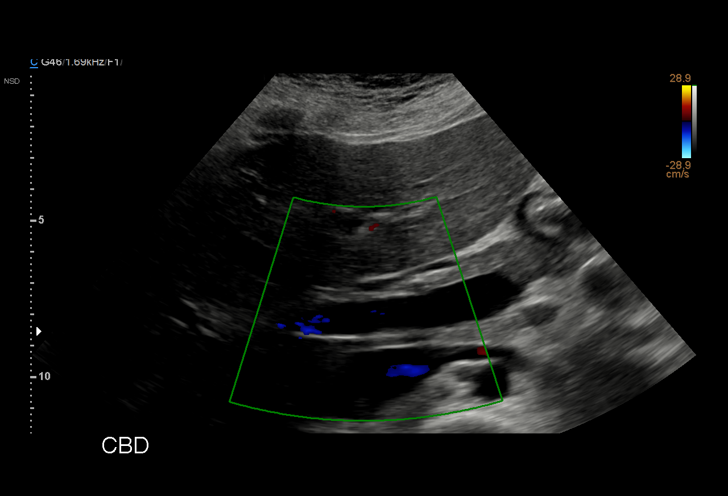
[im 54/54]
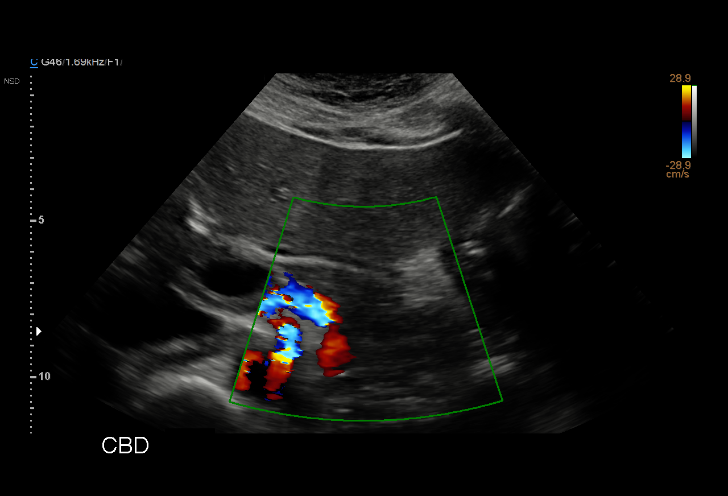

[15 of 25 positions shown; findings below may reference images not displayed]

FINDINGS: Gallbladder:

No gallstones or wall thickening visualized. No sonographic Murphy
sign noted by sonographer.

Common bile duct:

Diameter: 2.2 mm

Liver:

8 mm benign-appearing cyst in the right lobe of the liver. Within
normal limits in parenchymal echogenicity. Portal vein is patent on
color Doppler imaging with normal direction of blood flow towards
the liver.

The pancreas is obscured by bowel gas.
IMPRESSION: Normal appearance of the gallbladder.

Normal appearance of the liver apart from 8 mm benign-appearing
cyst.

## 2021-03-02 IMAGING — US US MFM OB COMP + 14 WK
1 series · 13 of 28 positions shown · non-contrast
Comparison: none

[Series 1: us mfm ob comp + 14 wk · 13 of 65 slices shown]
[im 3/65]
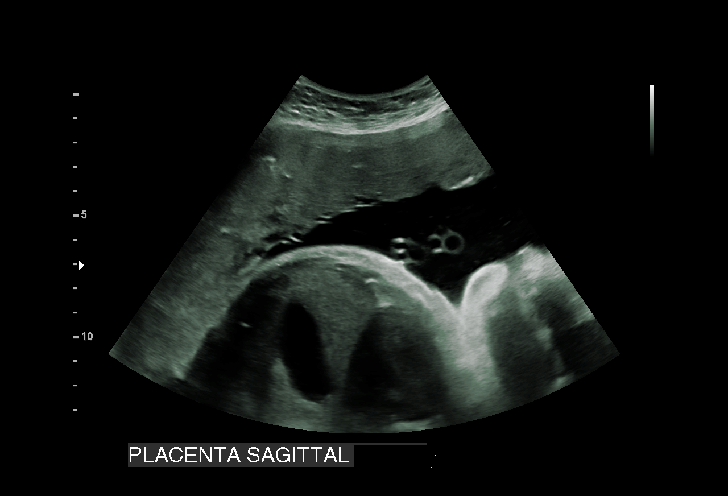
[im 8/65]
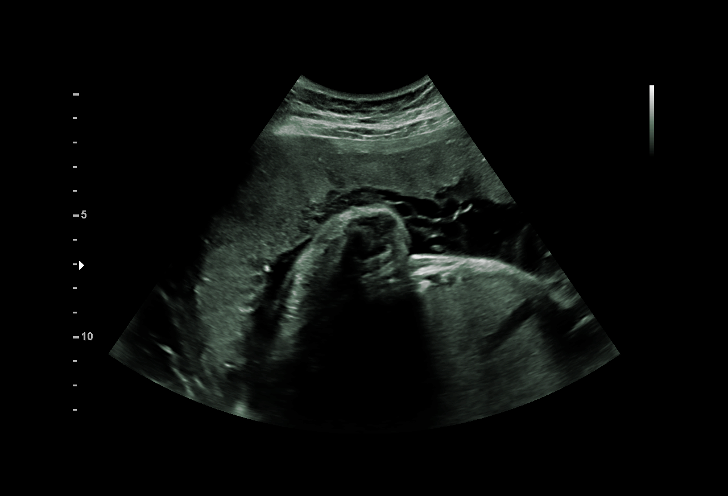
[im 12/65]
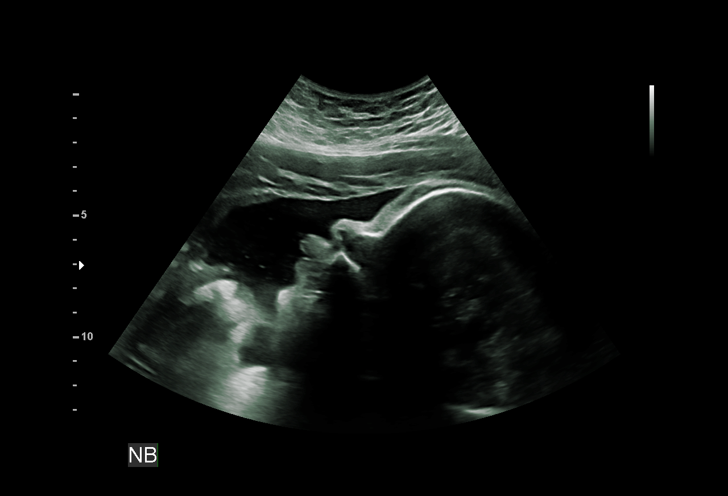
[im 17/65]
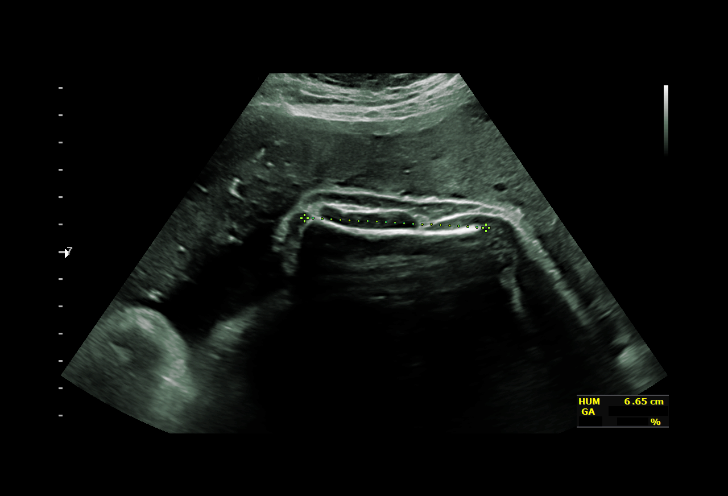
[im 22/65]
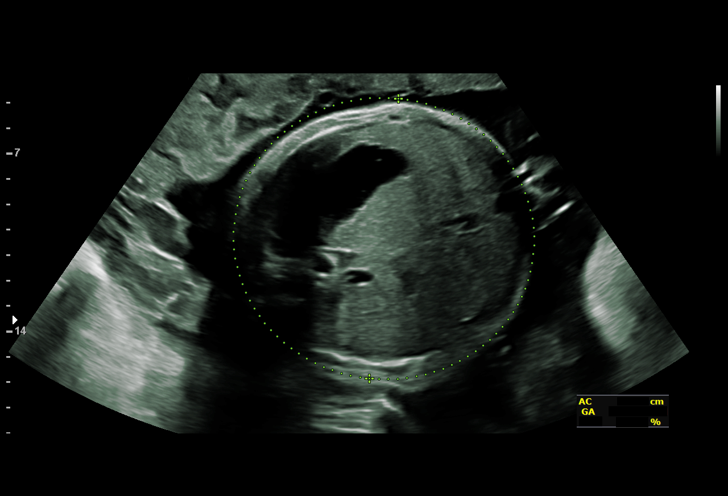
[im 27/65]
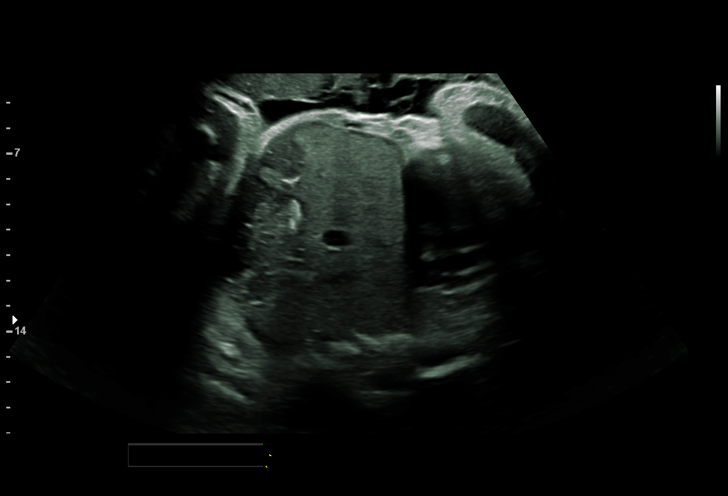
[im 34/65]
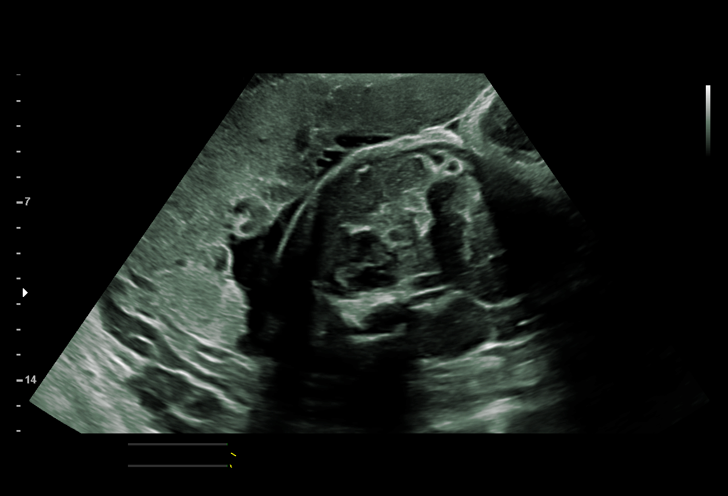
[im 38/65]
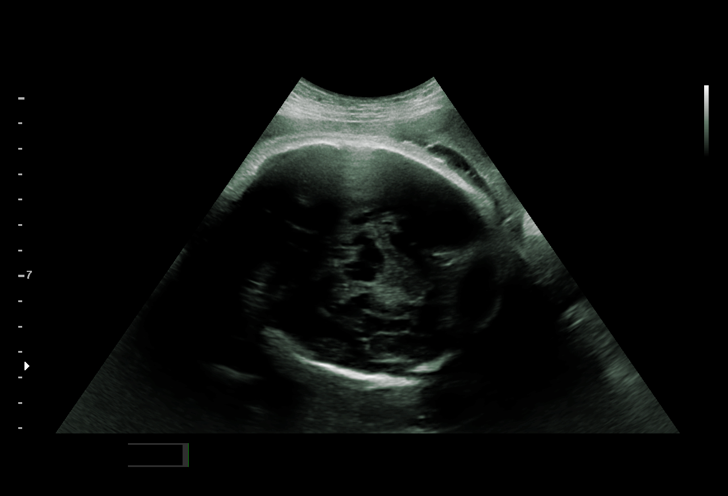
[im 43/65]
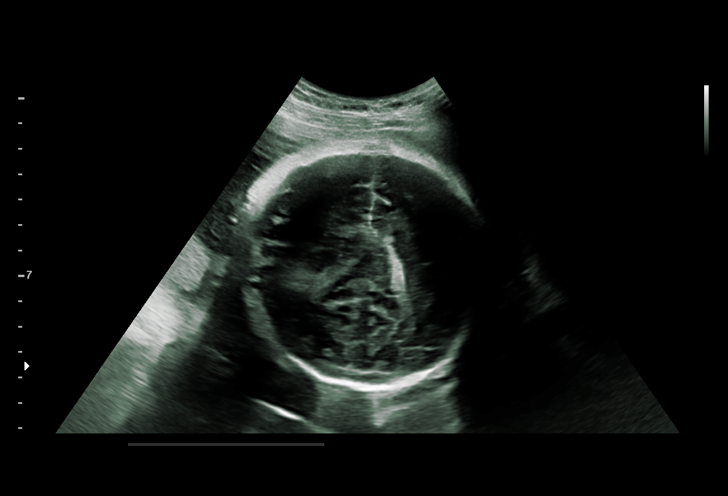
[im 48/65]
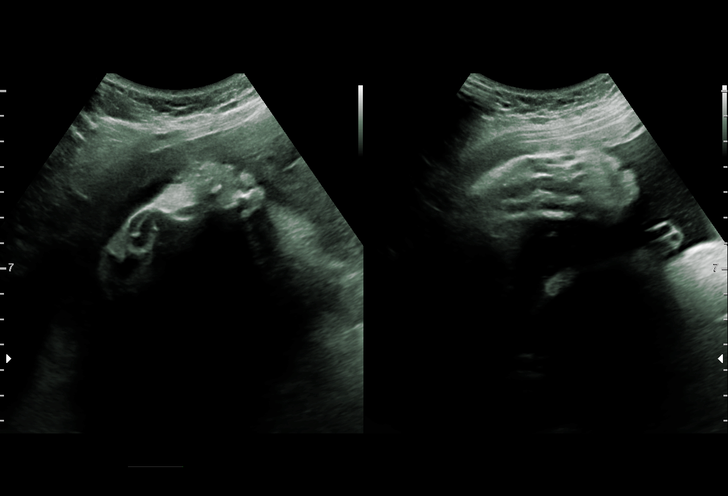
[im 53/65]
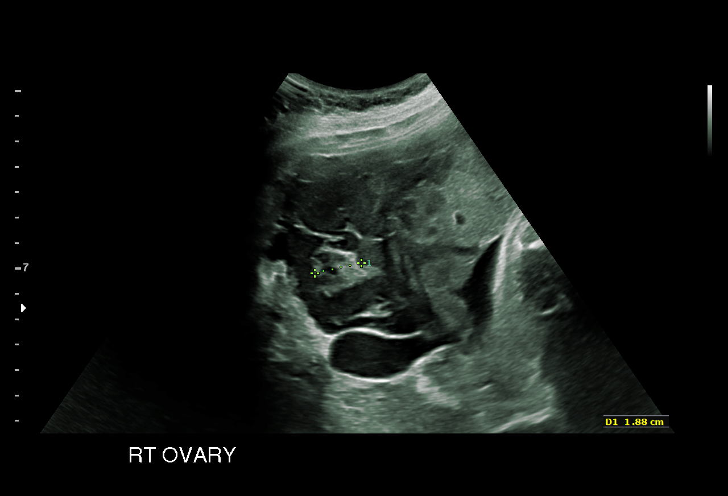
[im 57/65]
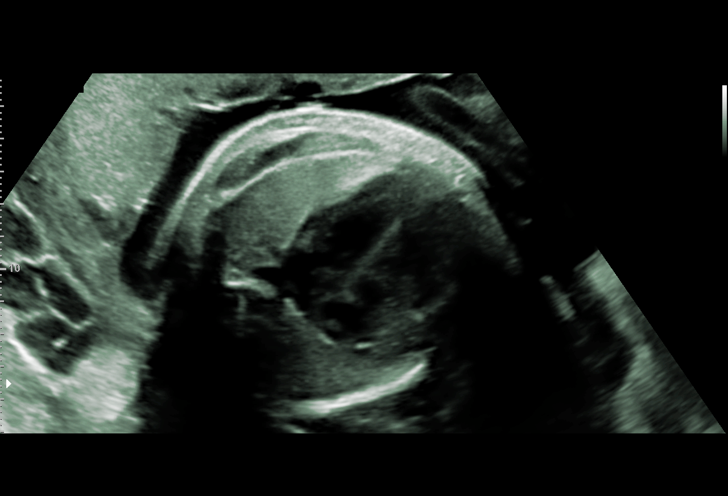
[im 62/65]
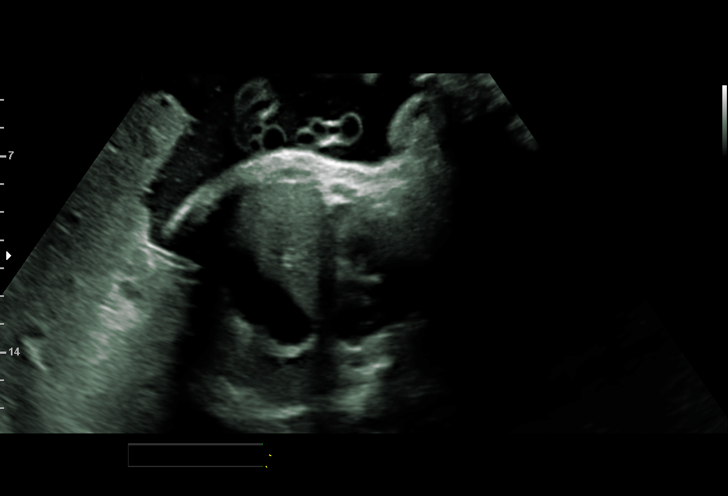

[13 of 28 positions shown; findings below may reference images not displayed]

[facility] [[facility]OSP[facility]]
                   BAMYAN CNM

  2  US MFM OB COMP + 14 WK               76805.01     GURBANOW YAGALOW
 ----------------------------------------------------------------------

 ----------------------------------------------------------------------
Indications

  Gestational diabetes in pregnancy,
  unspecified control (not checking sugars)
  38 weeks gestation of pregnancy
  Encounter for antenatal screening for
  malformations
  Poor weight gain of pregnancy, third trimester

 ----------------------------------------------------------------------
Fetal Evaluation

 Num Of Fetuses:          1
 Fetal Heart Rate(bpm):   148
 Cardiac Activity:        Observed
 Presentation:            Cephalic
 Placenta:                Anterior  RT
 P. Cord Insertion:       Not well visualized

 Amniotic Fluid
 AFI FV:      Within normal limits

 AFI Sum(cm)     %Tile       Largest Pocket(cm)
 19.5            78

 RUQ(cm)       RLQ(cm)       LUQ(cm)        LLQ(cm)

Biophysical Evaluation

 Amniotic F.V:   Within normal limits       F. Tone:         Observed
 F. Movement:    Observed                   Score:           [DATE]
 F. Breathing:   Observed
Biometry

 BPD:      92.6  mm     G. Age:  37w 4d         56  %    CI:        76.63   %    70 - 86
                                                         FL/HC:       22.0  %    20.9 -
 HC:      335.1  mm     G. Age:  38w 2d         32  %    HC/AC:       0.93       0.92 -
 AC:      361.8  mm     G. Age:  40w 1d         96  %    FL/BPD:      79.5  %    71 - 87
 FL:       73.6  mm     G. Age:  37w 5d         36  %    FL/AC:       20.3  %    20 - 24
 HUM:      66.5  mm     G. Age:  38w 4d         80  %

 Est. FW:    9759   gm     8 lb 2 oz   > 90  %
OB History

 Gravidity:    6         Term:   2         SAB:   1
 TOP:          2        Living:  2
Gestational Age

 Clinical EDD:  38w 5d                                        EDD:   01/26/19
 U/S Today:     38w 3d                                        EDD:   01/28/19
 Best:          38w 3d     Det. By:  38 weeks gestation of    EDD:   01/28/19
                                     pregnancy
Anatomy

 Cranium:               Appears normal         Aortic Arch:            Not well visualized
 Cavum:                 Appears normal         Ductal Arch:            Not well visualized
 Ventricles:            Appears normal         Diaphragm:              Appears normal
 Choroid Plexus:        Appears normal         Stomach:                Appears normal, left
                                                                       sided
 Cerebellum:            Appears normal         Abdomen:                Appears normal
 Posterior Fossa:       Appears normal         Abdominal Wall:         Appears nml (cord
                                                                       insert, abd wall)
 Nuchal Fold:           Not applicable (>20    Cord Vessels:           Appears normal (3
                        wks GA)                                        vessel cord)
 Face:                  Appears normal         Kidneys:                Appear normal
                        (orbits and profile)
 Lips:                  Appears normal         Bladder:                Appears normal
 Thoracic:              Appears normal         Spine:                  Not well visualized
 Heart:                 Appears normal         Upper Extremities:      Appears normal
                        (4CH, axis, and
                        situs)
 RVOT:                  Appears normal         Lower Extremities:      Appears normal
 LVOT:                  Appears normal

 Other:  Technically difficult due to advanced GA and fetal position.
Cervix Uterus Adnexa

 Cervix
 Not visualized (advanced GA >06wks)

 Left Ovary
 Not visualized.

 Right Ovary
 Within normal limits.
Impression

 Gestational diabetes. On diet control.
 The estimated fetal weight is at greater than the 90th
 percentile. Abdominal circumference measures at greater
 than the 95th percentile. Amniotic fluid is normal and good
 fetal activity is seen. Antenatal testing is reassuring. BPP [DATE].

 Patient has an appointment at your office today. If diabetes is
 not well-controlled, delivery may be considered at 39 weeks.
                      Ibanez, Cem

## 2021-03-18 ENCOUNTER — Encounter (HOSPITAL_COMMUNITY): Payer: Self-pay

## 2021-03-18 ENCOUNTER — Other Ambulatory Visit: Payer: Self-pay

## 2021-03-18 ENCOUNTER — Ambulatory Visit (HOSPITAL_COMMUNITY)
Admission: EM | Admit: 2021-03-18 | Discharge: 2021-03-18 | Disposition: A | Discharge status: Home / Self Care | Payer: Medicaid Other | Attending: Medical Oncology | Admitting: Medical Oncology

## 2021-03-18 DIAGNOSIS — J4521 Mild intermittent asthma with (acute) exacerbation: Secondary | ICD-10-CM

## 2021-03-18 DIAGNOSIS — R6889 Other general symptoms and signs: Secondary | ICD-10-CM | POA: Insufficient documentation

## 2021-03-18 MED ORDER — BENZONATATE 100 MG PO CAPS: 100.0000 mg | ORAL_CAPSULE | Freq: Three times a day (TID) | ORAL | 0 refills | Status: DC

## 2021-03-18 MED ORDER — PREDNISONE 10 MG (21) PO TBPK: ORAL_TABLET | Freq: Every day | ORAL | 0 refills | Status: DC

## 2021-03-18 MED ORDER — FLUTICASONE PROPIONATE 50 MCG/ACT NA SUSP: 2.0000 | Freq: Every day | NASAL | 0 refills | Status: DC

## 2021-03-18 MED ORDER — ALBUTEROL SULFATE HFA 108 (90 BASE) MCG/ACT IN AERS: 1.0000 | INHALATION_SPRAY | Freq: Four times a day (QID) | RESPIRATORY_TRACT | 0 refills | Status: DC | PRN

## 2021-03-18 NOTE — ED Provider Notes (Signed)
[facility]    CSN: [id number] Arrival date & time: 03/18/21  1147      History   Chief Complaint Chief Complaint  Patient presents with   Shortness of Breath    [phone] in car   Vaginal Discharge    HPI Phyllis Phillips is a 30 y.o. female.   HPI  Cough: Pt reports cough with some SOB with coughing fits, body aches for the past 4 days. SOB started 1 day ago and she reports that it only occurs with coughing fits or when she takes a large breath in. She does have asthma which is mild and intermittent. Daughter sick with influenza. She has tried nyquil for symptoms with mild relief. She denies chest pain, wheezing, hemoptysis, abdominal pain. She does report vaginal irritation for the past 4 days as well as white discharge. She believes this is due to soap.    Past Medical History:  Diagnosis Date   Anemia    Anxiety    Asthma    weather, exercise related; does not use inhaler often   Depression    Gestational diabetes    Infection    UTI   Recurrent upper respiratory infection (URI)    Shingles     Patient Active Problem List   Diagnosis Date Noted   GDM (gestational diabetes mellitus) 01/19/2019   Status post primary low transverse cesarean section 01/19/2019   Encounter for tubal ligation 01/19/2019   LGA (large for gestational age) fetus affecting management of mother 01/17/2019   Gestational diabetes mellitus (GDM) affecting pregnancy, antepartum 11/15/2018   Anemia 11/02/2018   Nausea/vomiting in pregnancy 10/21/2018   Poor weight gain of pregnancy 10/21/2018   Supervision of other normal pregnancy, antepartum 07/05/2018   GBS bacteriuria 06/03/2018    Past Surgical History:  Procedure Laterality Date   CESAREAN SECTION WITH BILATERAL TUBAL LIGATION N/A 01/19/2019   Procedure: CESAREAN SECTION WITH BILATERAL TUBAL LIGATION;  Surgeon: Lazaro Arms, MD;  Location: [facility];  Service: Obstetrics;  Laterality: N/A;   DILATION AND CURETTAGE OF  UTERUS     HAND TENDON SURGERY     WISDOM TOOTH EXTRACTION      OB History     Gravida  6   Para  3   Term  3   Preterm      AB  3   Living  3      SAB  1   IAB  2   Ectopic      Multiple  0   Live Births  3            Home Medications    Prior to Admission medications   Medication Sig Start Date End Date Taking? Authorizing Provider  albuterol (PROVENTIL HFA;VENTOLIN HFA) 108 (90 Base) MCG/ACT inhaler Inhale 2 puffs into the lungs every 6 (six) hours as needed for wheezing or shortness of breath.    [provider]  amoxicillin-clavulanate (AUGMENTIN) 875-125 MG tablet Take 1 tablet by mouth every 12 (twelve) hours. 10/30/20   Caccavale, Sophia, PA-C  escitalopram (LEXAPRO) 20 MG tablet Take 1 tablet (20 mg total) by mouth daily. 08/20/19   Venora Maples, MD  ferrous sulfate 325 (65 FE) MG tablet TAKE 1 TABLET (325 MG TOTAL) BY MOUTH 2 (TWO) TIMES DAILY WITH A MEAL. 04/16/19   Rhett Bannister S, DO  ibuprofen (ADVIL) 800 MG tablet Take 1 tablet (800 mg total) by mouth 3 (three) times daily. Patient not taking:  No sig reported 01/21/19   Tamera Stands, DO  lidocaine (XYLOCAINE) 2 % solution Use as directed 15 mLs in the mouth or throat as needed for mouth pain. 10/30/20   Caccavale, Sophia, PA-C  metroNIDAZOLE (FLAGYL) 500 MG tablet Take 1 tablet (500 mg total) by mouth 2 (two) times daily. Patient not taking: No sig reported 09/17/19   Constant, Peggy, MD  naproxen (NAPROSYN) 500 MG tablet Take 1 tablet (500 mg total) by mouth 2 (two) times daily with a meal. 10/30/20   Caccavale, Sophia, PA-C  Prenatal Vit-Fe Phos-FA-Omega (VITAFOL GUMMIES) 3.33-0.333-34.8 MG CHEW Chew 3 each by mouth daily. Patient not taking: No sig reported 08/02/18   Currie Paris, NP  promethazine (PHENERGAN) 25 MG tablet Take 1 tablet (25 mg total) by mouth every 6 (six) hours as needed for nausea. 10/22/18   Leftwich-Kirby, Wilmer Floor, CNM  senna-docusate (SENOKOT-S) 8.6-50 MG  tablet Take 2 tablets by mouth at bedtime as needed for mild constipation. 01/21/19   Tamera Stands, DO    Family History Family History  Problem Relation Age of Onset   Cancer Mother    Early death Mother    Arthritis Father    Diabetes Father    Hypertension Father    Varicose Veins Father     Social History Social History   Tobacco Use   Smoking status: Some Days    Pack years: 0.00   Smokeless tobacco: Never   Tobacco comments:    states quit 24yrs ago  Vaping Use   Vaping Use: Never used  Substance Use Topics   Alcohol use: Yes    Comment: rarely   Drug use: Not Currently    Types: Marijuana    Comment: "long time ago"     Allergies   Latex   Review of Systems Review of Systems  As stated above in HPI Physical Exam Triage Vital Signs ED Triage Vitals  Enc Vitals Group     BP 03/18/21 1219 102/74     Pulse Rate 03/18/21 1219 97     Resp 03/18/21 1219 20     Temp 03/18/21 1219 98.1 F (36.7 C)     Temp Source 03/18/21 1219 Oral     SpO2 03/18/21 1219 97 %     Weight --      Height --      Head Circumference --      Peak Flow --      Pain Score 03/18/21 1216 5     Pain Loc --      Pain Edu? --      Excl. in GC? --    No data found.  Updated Vital Signs BP 102/74 (BP Location: Right Arm)   Pulse 97   Temp 98.1 F (36.7 C) (Oral)   Resp 20   LMP  (Within Weeks) Comment: 2 weeks  SpO2 97%    Physical Exam Vitals and nursing note reviewed.  Constitutional:      General: She is not in acute distress.    Appearance: She is well-developed. She is ill-appearing. She is not toxic-appearing or diaphoretic.  HENT:     Head: Normocephalic and atraumatic.     Mouth/Throat:     Mouth: Mucous membranes are moist.     Pharynx: Oropharynx is clear. No pharyngeal swelling or oropharyngeal exudate.  Eyes:     Extraocular Movements: Extraocular movements intact.     Pupils: Pupils are equal, round, and reactive to light.  Cardiovascular:      Rate and Rhythm: Normal rate and regular rhythm.  Pulmonary:     Effort: Pulmonary effort is normal.     Breath sounds: Normal breath sounds.  Chest:     Chest wall: Tenderness present.    Musculoskeletal:     Cervical back: Normal range of motion and neck supple.  Skin:    General: Skin is warm.     Coloration: Skin is not cyanotic.  Neurological:     Mental Status: She is alert and oriented to person, place, and time.     UC Treatments / Results  Labs (all labs ordered are listed, but only abnormal results are displayed) Labs Reviewed - No data to display  EKG   Radiology No results found.  Procedures Procedures (including critical care time)  Medications Ordered in UC Medications - No data to display  Initial Impression / Assessment and Plan / UC Course  I have reviewed the triage vital signs and the nursing notes.  Pertinent labs & imaging results that were available during my care of the patient were reviewed by me and considered in my medical decision making (see chart for details).     New.  Likely influenza given her close exposure and symptoms.  Her vital signs are reassuring and her reproducible pain and history sounds like costochondritis.  As she does have a history of asthma and does also have some signs of an asthma flare we are going to start her on albuterol and prednisone along with Tessalon and Flonase.  We discussed and reviewed how to use these medications along with common potential side effects and precautions.  We discussed O2 monitoring and red flag signs and symptoms.  Rest, fluids.  Final Clinical Impressions(s) / UC Diagnoses   Final diagnoses:  None   Discharge Instructions   None    ED Prescriptions   None    PDMP not reviewed this encounter.   Rushie Chestnut, [place] 03/18/21 1332

## 2021-03-18 NOTE — ED Triage Notes (Signed)
Pt reports shortness of breath x 1 day; bilateral flank pain when lay down and coughing x 1 day; body ache sx 2 days; cough x 4 days. Reports her daughter was diagnosed with influenza 1 week ago.   Vaginal pain and white vaginal discharge x 4 days.

## 2021-03-19 LAB — CERVICOVAGINAL ANCILLARY ONLY
Bacterial Vaginitis (gardnerella): POSITIVE — AB
Candida Glabrata: NEGATIVE
Candida Vaginitis: NEGATIVE
Chlamydia: NEGATIVE
Comment: NEGATIVE
Comment: NEGATIVE
Comment: NEGATIVE
Comment: NEGATIVE
Comment: NEGATIVE
Comment: NORMAL
Neisseria Gonorrhea: NEGATIVE
Trichomonas: POSITIVE — AB

## 2021-03-20 ENCOUNTER — Telehealth (HOSPITAL_COMMUNITY): Payer: Self-pay | Admitting: Emergency Medicine

## 2021-03-20 MED ORDER — METRONIDAZOLE 500 MG PO TABS: 500.0000 mg | ORAL_TABLET | Freq: Two times a day (BID) | ORAL | 0 refills | Status: DC

## 2021-03-26 ENCOUNTER — Encounter: Payer: Self-pay | Admitting: Family Medicine

## 2021-06-16 ENCOUNTER — Other Ambulatory Visit: Payer: Self-pay

## 2021-06-16 ENCOUNTER — Ambulatory Visit (HOSPITAL_COMMUNITY)
Admission: EM | Admit: 2021-06-16 | Discharge: 2021-06-16 | Disposition: A | Discharge status: Home / Self Care | Payer: Medicaid Other | Attending: Student | Admitting: Student

## 2021-06-16 ENCOUNTER — Encounter (HOSPITAL_COMMUNITY): Payer: Self-pay | Admitting: Emergency Medicine

## 2021-06-16 DIAGNOSIS — J069 Acute upper respiratory infection, unspecified: Secondary | ICD-10-CM | POA: Insufficient documentation

## 2021-06-16 DIAGNOSIS — Z1152 Encounter for screening for COVID-19: Secondary | ICD-10-CM | POA: Diagnosis not present

## 2021-06-16 DIAGNOSIS — R112 Nausea with vomiting, unspecified: Secondary | ICD-10-CM | POA: Diagnosis present

## 2021-06-16 DIAGNOSIS — Z9851 Tubal ligation status: Secondary | ICD-10-CM | POA: Diagnosis not present

## 2021-06-16 MED ORDER — ONDANSETRON 8 MG PO TBDP: 8.0000 mg | ORAL_TABLET | Freq: Three times a day (TID) | ORAL | 0 refills | Status: DC | PRN

## 2021-06-16 NOTE — ED Triage Notes (Signed)
Pt c/o diarrhea, nausea, chills since Sunday. Needs covid test for work.

## 2021-06-16 NOTE — ED Provider Notes (Signed)
[facility]    CSN: [id number] Arrival date & time: 06/16/21  1830      History   Chief Complaint Chief Complaint  Patient presents with   Diarrhea   Chills    HPI Phyllis Phillips is a 30 y.o. female presenting with 3 days of body aches, subjective chills, nausea with vomiting, generalized crampy abdominal pain.  Medical history noncontributory.  She has not checked her temperature at home.  Requires COVID test for work.  Nausea and vomiting yesterday, but no vomiting today.  Few episodes of watery diarrhea today.  Denies shortness of breath, dizziness, chest pain, weakness.  HPI  Past Medical History:  Diagnosis Date   Anemia    Anxiety    Asthma    weather, exercise related; does not use inhaler often   Depression    Gestational diabetes    Infection    UTI   Recurrent upper respiratory infection (URI)    Shingles     Patient Active Problem List   Diagnosis Date Noted   GDM (gestational diabetes mellitus) 01/19/2019   Status post primary low transverse cesarean section 01/19/2019   Encounter for tubal ligation 01/19/2019   LGA (large for gestational age) fetus affecting management of mother 01/17/2019   Gestational diabetes mellitus (GDM) affecting pregnancy, antepartum 11/15/2018   Anemia 11/02/2018   Nausea/vomiting in pregnancy 10/21/2018   Poor weight gain of pregnancy 10/21/2018   Supervision of other normal pregnancy, antepartum 07/05/2018   GBS bacteriuria 06/03/2018    Past Surgical History:  Procedure Laterality Date   CESAREAN SECTION WITH BILATERAL TUBAL LIGATION N/A 01/19/2019   Procedure: CESAREAN SECTION WITH BILATERAL TUBAL LIGATION;  Surgeon: Lazaro Arms, MD;  Location: [facility];  Service: Obstetrics;  Laterality: N/A;   DILATION AND CURETTAGE OF UTERUS     HAND TENDON SURGERY     WISDOM TOOTH EXTRACTION      OB History     Gravida  6   Para  3   Term  3   Preterm      AB  3   Living  3      SAB  1   IAB  2    Ectopic      Multiple  0   Live Births  3            Home Medications    Prior to Admission medications   Medication Sig Start Date End Date Taking? Authorizing Provider  ondansetron (ZOFRAN ODT) 8 MG disintegrating tablet Take 1 tablet (8 mg total) by mouth every 8 (eight) hours as needed for nausea or vomiting. 06/16/21  Yes Rhys Martini, PA-C  albuterol (PROVENTIL HFA;VENTOLIN HFA) 108 (90 Base) MCG/ACT inhaler Inhale 2 puffs into the lungs every 6 (six) hours as needed for wheezing or shortness of breath.    [provider]  albuterol (VENTOLIN HFA) 108 (90 Base) MCG/ACT inhaler Inhale 1-2 puffs into the lungs every 6 (six) hours as needed for wheezing or shortness of breath. 03/18/21   Rushie Chestnut, PA-C  amoxicillin-clavulanate (AUGMENTIN) 875-125 MG tablet Take 1 tablet by mouth every 12 (twelve) hours. 10/30/20   Caccavale, Sophia, PA-C  benzonatate (TESSALON) 100 MG capsule Take 1 capsule (100 mg total) by mouth every 8 (eight) hours. 03/18/21   Rushie Chestnut, PA-C  escitalopram (LEXAPRO) 20 MG tablet Take 1 tablet (20 mg total) by mouth daily. 08/20/19   Venora Maples, MD  ferrous sulfate 325 (  65 FE) MG tablet TAKE 1 TABLET (325 MG TOTAL) BY MOUTH 2 (TWO) TIMES DAILY WITH A MEAL. 04/16/19   Rhett Bannister S, DO  fluticasone (FLONASE) 50 MCG/ACT nasal spray Place 2 sprays into both nostrils daily. 03/18/21   Rushie Chestnut, PA-C  ibuprofen (ADVIL) 800 MG tablet Take 1 tablet (800 mg total) by mouth 3 (three) times daily. Patient not taking: No sig reported 01/21/19   Tamera Stands, DO  lidocaine (XYLOCAINE) 2 % solution Use as directed 15 mLs in the mouth or throat as needed for mouth pain. 10/30/20   Caccavale, Sophia, PA-C  metroNIDAZOLE (FLAGYL) 500 MG tablet Take 1 tablet (500 mg total) by mouth 2 (two) times daily. 03/20/21   Lamptey, Britta Mccreedy, MD  naproxen (NAPROSYN) 500 MG tablet Take 1 tablet (500 mg total) by mouth 2 (two) times daily with a  meal. 10/30/20   Caccavale, Sophia, PA-C  predniSONE (STERAPRED UNI-PAK 21 TAB) 10 MG (21) TBPK tablet Take by mouth daily. Take 6 tabs by mouth daily  for 2 days, then 5 tabs for 2 days, then 4 tabs for 2 days, then 3 tabs for 2 days, 2 tabs for 2 days, then 1 tab by mouth daily for 2 days 03/18/21   Rushie Chestnut, PA-C  Prenatal Vit-Fe Phos-FA-Omega (VITAFOL GUMMIES) 3.33-0.333-34.8 MG CHEW Chew 3 each by mouth daily. Patient not taking: No sig reported 08/02/18   Currie Paris, NP  promethazine (PHENERGAN) 25 MG tablet Take 1 tablet (25 mg total) by mouth every 6 (six) hours as needed for nausea. 10/22/18   Leftwich-Kirby, Wilmer Floor, CNM  senna-docusate (SENOKOT-S) 8.6-50 MG tablet Take 2 tablets by mouth at bedtime as needed for mild constipation. 01/21/19   Tamera Stands, DO    Family History Family History  Problem Relation Age of Onset   Cancer Mother    Early death Mother    Arthritis Father    Diabetes Father    Hypertension Father    Varicose Veins Father     Social History Social History   Tobacco Use   Smoking status: Some Days   Smokeless tobacco: Never   Tobacco comments:    states quit 75yrs ago  Vaping Use   Vaping Use: Never used  Substance Use Topics   Alcohol use: Yes    Comment: rarely   Drug use: Not Currently    Types: Marijuana    Comment: "long time ago"     Allergies   Latex   Review of Systems Review of Systems  Constitutional:  Negative for appetite change, chills and fever.  HENT:  Positive for congestion. Negative for ear pain, rhinorrhea, sinus pressure, sinus pain and sore throat.   Eyes:  Negative for redness and visual disturbance.  Respiratory:  Negative for cough, chest tightness, shortness of breath and wheezing.   Cardiovascular:  Negative for chest pain and palpitations.  Gastrointestinal:  Positive for nausea and vomiting. Negative for abdominal pain, constipation and diarrhea.  Genitourinary:  Negative for dysuria, frequency  and urgency.  Musculoskeletal:  Negative for myalgias.  Neurological:  Negative for dizziness, weakness and headaches.  Psychiatric/Behavioral:  Negative for confusion.   All other systems reviewed and are negative.   Physical Exam Triage Vital Signs ED Triage Vitals [06/16/21 2036]  Enc Vitals Group     BP 119/80     Pulse Rate 81     Resp 18     Temp 98.4 F (36.9 C)  Temp Source Oral     SpO2 98 %     Weight      Height      Head Circumference      Peak Flow      Pain Score      Pain Loc      Pain Edu?      Excl. in GC?    No data found.  Updated Vital Signs BP 119/80 (BP Location: Right Arm)   Pulse 81   Temp 98.4 F (36.9 C) (Oral)   Resp 18   LMP 06/15/2021   SpO2 98%   Visual Acuity Right Eye Distance:   Left Eye Distance:   Bilateral Distance:    Right Eye Near:   Left Eye Near:    Bilateral Near:     Physical Exam Vitals reviewed.  Constitutional:      General: She is not in acute distress.    Appearance: Normal appearance. She is not ill-appearing.  HENT:     Head: Normocephalic and atraumatic.     Right Ear: Tympanic membrane, ear canal and external ear normal. No tenderness. No middle ear effusion. There is no impacted cerumen. Tympanic membrane is not perforated, erythematous, retracted or bulging.     Left Ear: Tympanic membrane, ear canal and external ear normal. No tenderness.  No middle ear effusion. There is no impacted cerumen. Tympanic membrane is not perforated, erythematous, retracted or bulging.     Nose: Nose normal. No congestion.     Mouth/Throat:     Mouth: Mucous membranes are moist.     Pharynx: Uvula midline. No oropharyngeal exudate or posterior oropharyngeal erythema.  Eyes:     Extraocular Movements: Extraocular movements intact.     Pupils: Pupils are equal, round, and reactive to light.  Cardiovascular:     Rate and Rhythm: Normal rate and regular rhythm.     Heart sounds: Normal heart sounds.  Pulmonary:      Effort: Pulmonary effort is normal.     Breath sounds: Normal breath sounds. No decreased breath sounds, wheezing, rhonchi or rales.  Abdominal:     Palpations: Abdomen is soft.     Tenderness: There is no abdominal tenderness. There is no guarding or rebound.     Comments: No tenderness   Neurological:     General: No focal deficit present.     Mental Status: She is alert and oriented to person, place, and time.  Psychiatric:        Mood and Affect: Mood normal.        Behavior: Behavior normal.        Thought Content: Thought content normal.        Judgment: Judgment normal.     UC Treatments / Results  Labs (all labs ordered are listed, but only abnormal results are displayed) Labs Reviewed  SARS CORONAVIRUS 2 (TAT 6-24 HRS)    EKG   Radiology No results found.  Procedures Procedures (including critical care time)  Medications Ordered in UC Medications - No data to display  Initial Impression / Assessment and Plan / UC Course  I have reviewed the triage vital signs and the nursing notes.  Pertinent labs & imaging results that were available during my care of the patient were reviewed by me and considered in my medical decision making (see chart for details).     This patient is a very pleasant 30 y.o. year old female presenting with viral URI and nausea. Today this  pt is afebrile nontachycardic nontachypneic, oxygenating well on room air, no wheezes rhonchi or rales. Tubal ligation for contraception and she is on her period.   Covid PCR sent  Zofran ODT  ED return precautions discussed. Patient verbalizes understanding and agreement.  .   Final Clinical Impressions(s) / UC Diagnoses   Final diagnoses:  Viral URI  Encounter for screening for COVID-19  History of tubal ligation  Non-intractable vomiting with nausea, unspecified vomiting type     Discharge Instructions      -Take the Zofran (ondansetron) up to 3 times daily for nausea and vomiting.  Dissolve one pill under your tongue or between your teeth and your cheek. -Good hydration   ED Prescriptions     Medication Sig Dispense Auth. Provider   ondansetron (ZOFRAN ODT) 8 MG disintegrating tablet Take 1 tablet (8 mg total) by mouth every 8 (eight) hours as needed for nausea or vomiting. 20 tablet Rhys Martini, PA-C      PDMP not reviewed this encounter.   Rhys Martini, PA-C 06/16/21 2054

## 2021-06-16 NOTE — Discharge Instructions (Addendum)
-  Take the Zofran (ondansetron) up to 3 times daily for nausea and vomiting. Dissolve one pill under your tongue or between your teeth and your cheek. -Good hydration

## 2021-06-17 LAB — SARS CORONAVIRUS 2 (TAT 6-24 HRS): SARS Coronavirus 2: NEGATIVE

## 2021-06-21 ENCOUNTER — Emergency Department (HOSPITAL_COMMUNITY)
Admission: EM | Admit: 2021-06-21 | Discharge: 2021-06-21 | Disposition: A | Discharge status: Home / Self Care | Payer: Medicaid Other | Attending: Emergency Medicine | Admitting: Emergency Medicine

## 2021-06-21 ENCOUNTER — Other Ambulatory Visit: Payer: Self-pay

## 2021-06-21 ENCOUNTER — Encounter (HOSPITAL_COMMUNITY): Payer: Self-pay

## 2021-06-21 DIAGNOSIS — N898 Other specified noninflammatory disorders of vagina: Secondary | ICD-10-CM | POA: Diagnosis not present

## 2021-06-21 DIAGNOSIS — F17200 Nicotine dependence, unspecified, uncomplicated: Secondary | ICD-10-CM | POA: Insufficient documentation

## 2021-06-21 DIAGNOSIS — J069 Acute upper respiratory infection, unspecified: Secondary | ICD-10-CM | POA: Diagnosis not present

## 2021-06-21 DIAGNOSIS — Z7951 Long term (current) use of inhaled steroids: Secondary | ICD-10-CM | POA: Insufficient documentation

## 2021-06-21 DIAGNOSIS — R0981 Nasal congestion: Secondary | ICD-10-CM | POA: Diagnosis present

## 2021-06-21 DIAGNOSIS — R1084 Generalized abdominal pain: Secondary | ICD-10-CM | POA: Insufficient documentation

## 2021-06-21 DIAGNOSIS — Z91040 Latex allergy status: Secondary | ICD-10-CM | POA: Insufficient documentation

## 2021-06-21 DIAGNOSIS — J45909 Unspecified asthma, uncomplicated: Secondary | ICD-10-CM | POA: Diagnosis not present

## 2021-06-21 LAB — CBC
HCT: 36.9 % (ref 36.0–46.0)
Hemoglobin: 11.3 g/dL — ABNORMAL LOW (ref 12.0–15.0)
MCH: 25.9 pg — ABNORMAL LOW (ref 26.0–34.0)
MCHC: 30.6 g/dL (ref 30.0–36.0)
MCV: 84.4 fL (ref 80.0–100.0)
Platelets: 277 10*3/uL (ref 150–400)
RBC: 4.37 MIL/uL (ref 3.87–5.11)
RDW: 13.7 % (ref 11.5–15.5)
WBC: 5.7 10*3/uL (ref 4.0–10.5)
nRBC: 0 % (ref 0.0–0.2)

## 2021-06-21 LAB — URINALYSIS, ROUTINE W REFLEX MICROSCOPIC
Bilirubin Urine: NEGATIVE
Glucose, UA: NEGATIVE mg/dL
Hgb urine dipstick: NEGATIVE
Ketones, ur: NEGATIVE mg/dL
Leukocytes,Ua: NEGATIVE
Nitrite: NEGATIVE
Protein, ur: NEGATIVE mg/dL
Specific Gravity, Urine: 1.025 (ref 1.005–1.030)
pH: 6 (ref 5.0–8.0)

## 2021-06-21 LAB — COMPREHENSIVE METABOLIC PANEL
ALT: 16 U/L (ref 0–44)
AST: 18 U/L (ref 15–41)
Albumin: 3 g/dL — ABNORMAL LOW (ref 3.5–5.0)
Alkaline Phosphatase: 50 U/L (ref 38–126)
Anion gap: 6 (ref 5–15)
BUN: 12 mg/dL (ref 6–20)
CO2: 26 mmol/L (ref 22–32)
Calcium: 8.6 mg/dL — ABNORMAL LOW (ref 8.9–10.3)
Chloride: 105 mmol/L (ref 98–111)
Creatinine, Ser: 0.87 mg/dL (ref 0.44–1.00)
GFR, Estimated: >60 mL/min (ref >60)
Glucose, Bld: 125 mg/dL — ABNORMAL HIGH (ref 70–99)
Potassium: 4 mmol/L (ref 3.5–5.1)
Sodium: 137 mmol/L (ref 135–145)
Total Bilirubin: 0.4 mg/dL (ref 0.3–1.2)
Total Protein: 5.5 g/dL — ABNORMAL LOW (ref 6.5–8.1)

## 2021-06-21 LAB — WET PREP, GENITAL
Clue Cells Wet Prep HPF POC: NONE SEEN
Sperm: NONE SEEN
Trich, Wet Prep: NONE SEEN
Yeast Wet Prep HPF POC: NONE SEEN

## 2021-06-21 LAB — LIPASE, BLOOD: Lipase: 29 U/L (ref 11–51)

## 2021-06-21 LAB — I-STAT BETA HCG BLOOD, ED (MC, WL, AP ONLY): I-stat hCG, quantitative: <5 m[IU]/mL (ref <5)

## 2021-06-21 LAB — CBG MONITORING, ED: Glucose-Capillary: 100 mg/dL — ABNORMAL HIGH (ref 70–99)

## 2021-06-21 NOTE — ED Provider Notes (Signed)
[facility] EMERGENCY DEPARTMENT Provider Note   CSN: [id number] Arrival date & time: 06/21/21  8841     History Chief Complaint  Patient presents with   Abdominal Pain   Nasal Congestion    Phyllis Phillips is a 30 y.o. female.  30 yo female with complaint of feeling unwell for the past week.  Reports sneezing with copious amounts of mucus production with body aches, abdominal cramping and nausea.  Patient went to urgent care on day 3 or 4 of symptoms, had a negative COVID test at that time.  Patient is taking Zofran for her nausea with minimal relief.  Denies vomiting, fevers, chills, changes in bowel or bladder habits.  Unsure if she is having abnormal vaginal discharge, reports pelvic pain.  Patient is taking Mucinex without improvement in her congestion or runny nose.      Past Medical History:  Diagnosis Date   Anemia    Anxiety    Asthma    weather, exercise related; does not use inhaler often   Depression    Gestational diabetes    Infection    UTI   Recurrent upper respiratory infection (URI)    Shingles     Patient Active Problem List   Diagnosis Date Noted   GDM (gestational diabetes mellitus) 01/19/2019   Status post primary low transverse cesarean section 01/19/2019   Encounter for tubal ligation 01/19/2019   LGA (large for gestational age) fetus affecting management of mother 01/17/2019   Gestational diabetes mellitus (GDM) affecting pregnancy, antepartum 11/15/2018   Anemia 11/02/2018   Nausea/vomiting in pregnancy 10/21/2018   Poor weight gain of pregnancy 10/21/2018   Supervision of other normal pregnancy, antepartum 07/05/2018   GBS bacteriuria 06/03/2018    Past Surgical History:  Procedure Laterality Date   CESAREAN SECTION WITH BILATERAL TUBAL LIGATION N/A 01/19/2019   Procedure: CESAREAN SECTION WITH BILATERAL TUBAL LIGATION;  Surgeon: Lazaro Arms, MD;  Location: [facility] LD ORS;  Service: Obstetrics;  Laterality: N/A;   DILATION  AND CURETTAGE OF UTERUS     HAND TENDON SURGERY     WISDOM TOOTH EXTRACTION       OB History     Gravida  6   Para  3   Term  3   Preterm      AB  3   Living  3      SAB  1   IAB  2   Ectopic      Multiple  0   Live Births  3           Family History  Problem Relation Age of Onset   Cancer Mother    Early death Mother    Arthritis Father    Diabetes Father    Hypertension Father    Varicose Veins Father     Social History   Tobacco Use   Smoking status: Some Days   Smokeless tobacco: Never   Tobacco comments:    states quit 22yrs ago  Vaping Use   Vaping Use: Never used  Substance Use Topics   Alcohol use: Yes    Comment: rarely   Drug use: Not Currently    Types: Marijuana    Comment: "long time ago"    Home Medications Prior to Admission medications   Medication Sig Start Date End Date Taking? Authorizing Provider  albuterol (PROVENTIL HFA;VENTOLIN HFA) 108 (90 Base) MCG/ACT inhaler Inhale 2 puffs into the lungs every 6 (six) hours as needed  for wheezing or shortness of breath.    [provider]  albuterol (VENTOLIN HFA) 108 (90 Base) MCG/ACT inhaler Inhale 1-2 puffs into the lungs every 6 (six) hours as needed for wheezing or shortness of breath. 03/18/21   Rushie Chestnut, PA-C  amoxicillin-clavulanate (AUGMENTIN) 875-125 MG tablet Take 1 tablet by mouth every 12 (twelve) hours. 10/30/20   Caccavale, Sophia, PA-C  benzonatate (TESSALON) 100 MG capsule Take 1 capsule (100 mg total) by mouth every 8 (eight) hours. 03/18/21   Rushie Chestnut, PA-C  escitalopram (LEXAPRO) 20 MG tablet Take 1 tablet (20 mg total) by mouth daily. 08/20/19   Venora Maples, MD  ferrous sulfate 325 (65 FE) MG tablet TAKE 1 TABLET (325 MG TOTAL) BY MOUTH 2 (TWO) TIMES DAILY WITH A MEAL. 04/16/19   Rhett Bannister S, DO  fluticasone (FLONASE) 50 MCG/ACT nasal spray Place 2 sprays into both nostrils daily. 03/18/21   Rushie Chestnut, PA-C  ibuprofen  (ADVIL) 800 MG tablet Take 1 tablet (800 mg total) by mouth 3 (three) times daily. Patient not taking: No sig reported 01/21/19   Tamera Stands, DO  lidocaine (XYLOCAINE) 2 % solution Use as directed 15 mLs in the mouth or throat as needed for mouth pain. 10/30/20   Caccavale, Sophia, PA-C  metroNIDAZOLE (FLAGYL) 500 MG tablet Take 1 tablet (500 mg total) by mouth 2 (two) times daily. 03/20/21   Lamptey, Britta Mccreedy, MD  naproxen (NAPROSYN) 500 MG tablet Take 1 tablet (500 mg total) by mouth 2 (two) times daily with a meal. 10/30/20   Caccavale, Sophia, PA-C  ondansetron (ZOFRAN ODT) 8 MG disintegrating tablet Take 1 tablet (8 mg total) by mouth every 8 (eight) hours as needed for nausea or vomiting. 06/16/21   Rhys Martini, PA-C  predniSONE (STERAPRED UNI-PAK 21 TAB) 10 MG (21) TBPK tablet Take by mouth daily. Take 6 tabs by mouth daily  for 2 days, then 5 tabs for 2 days, then 4 tabs for 2 days, then 3 tabs for 2 days, 2 tabs for 2 days, then 1 tab by mouth daily for 2 days 03/18/21   Rushie Chestnut, PA-C  Prenatal Vit-Fe Phos-FA-Omega (VITAFOL GUMMIES) 3.33-0.333-34.8 MG CHEW Chew 3 each by mouth daily. Patient not taking: No sig reported 08/02/18   Currie Paris, NP  promethazine (PHENERGAN) 25 MG tablet Take 1 tablet (25 mg total) by mouth every 6 (six) hours as needed for nausea. 10/22/18   Leftwich-Kirby, Wilmer Floor, CNM  senna-docusate (SENOKOT-S) 8.6-50 MG tablet Take 2 tablets by mouth at bedtime as needed for mild constipation. 01/21/19   Tamera Stands, DO    Allergies    Latex  Review of Systems   Review of Systems  Constitutional:  Positive for chills. Negative for diaphoresis and fever.  HENT:  Positive for congestion, rhinorrhea and sneezing.   Respiratory:  Negative for cough.   Gastrointestinal:  Positive for abdominal pain, diarrhea and nausea. Negative for constipation and vomiting.  Genitourinary:  Positive for pelvic pain and vaginal discharge. Negative for dysuria and  frequency.  Musculoskeletal:  Positive for arthralgias and myalgias.  Skin:  Negative for rash and wound.  Allergic/Immunologic: Negative for immunocompromised state.  Neurological:  Negative for weakness.  Psychiatric/Behavioral:  Negative for confusion.   All other systems reviewed and are negative.  Physical Exam Updated Vital Signs BP (!) 99/56   Pulse 74   Temp 98.3 F (36.8 C) (Oral)   Resp 18  Ht 5\' 6"  (1.676 m)   Wt 104.8 kg   LMP 06/15/2021   SpO2 98%   BMI 37.28 kg/m   Physical Exam Vitals and nursing note reviewed. Exam conducted with a chaperone present.  Constitutional:      General: She is not in acute distress.    Appearance: She is well-developed. She is not diaphoretic.  HENT:     Head: Normocephalic and atraumatic.  Cardiovascular:     Rate and Rhythm: Normal rate and regular rhythm.     Heart sounds: Normal heart sounds.  Pulmonary:     Effort: Pulmonary effort is normal.     Breath sounds: Normal breath sounds.  Abdominal:     Palpations: Abdomen is soft.     Tenderness: There is no abdominal tenderness. There is no right CVA tenderness or left CVA tenderness.  Genitourinary:    Vagina: Vaginal discharge present.     Cervix: No cervical motion tenderness or friability.     Uterus: Normal. Not enlarged and not tender.      Adnexa: Right adnexa normal and left adnexa normal.       Right: No mass, tenderness or fullness.         Left: No mass, tenderness or fullness.       Comments: Small amount of white dc present Skin:    General: Skin is warm and dry.     Findings: No erythema or rash.  Neurological:     Mental Status: She is alert and oriented to person, place, and time.  Psychiatric:        Behavior: Behavior normal.    ED Results / Procedures / Treatments   Labs (all labs ordered are listed, but only abnormal results are displayed) Labs Reviewed  WET PREP, GENITAL - Abnormal; Notable for the following components:      Result Value    WBC, Wet Prep HPF POC FEW (*)    All other components within normal limits  COMPREHENSIVE METABOLIC PANEL - Abnormal; Notable for the following components:   Glucose, Bld 125 (*)    Calcium 8.6 (*)    Total Protein 5.5 (*)    Albumin 3.0 (*)    All other components within normal limits  CBC - Abnormal; Notable for the following components:   Hemoglobin 11.3 (*)    MCH 25.9 (*)    All other components within normal limits  URINALYSIS, ROUTINE W REFLEX MICROSCOPIC - Abnormal; Notable for the following components:   APPearance HAZY (*)    All other components within normal limits  CBG MONITORING, ED - Abnormal; Notable for the following components:   Glucose-Capillary 100 (*)    All other components within normal limits  LIPASE, BLOOD  I-STAT BETA HCG BLOOD, ED ([facility], [facility], [facility] ONLY)  GC/CHLAMYDIA PROBE AMP ([place]) NOT AT [facility]    EKG EKG Interpretation  Date/Time:  Sunday June 21 2021 03:50:00 EDT Ventricular Rate:  81 PR Interval:  158 QRS Duration: 76 QT Interval:  386 QTC Calculation: 448 R Axis:   1 Text Interpretation: Normal sinus rhythm Normal ECG Confirmed by [id number] [phone]) on 06/21/2021 8:16:58 AM  Radiology No results found.  Procedures Procedures   Medications Ordered in ED Medications - No data to display  ED Course  I have reviewed the triage vital signs and the nursing notes.  Pertinent labs & imaging results that were available during my care of the patient were reviewed by me and considered in my  medical decision making (see chart for details).  Clinical Course as of 06/21/21 1009  Sun Jun 21, 2021  0845 GC/Chlamydia probe amp [PM]  126 30 year old female with multiple complaints as above.  On exam, is well-appearing.  Lungs are clear to auscultation, abdomen is soft and nontender.  On chaperoned pelvic exam, is found to have a trace amount of white discharge without significant pelvic tenderness or cervical motion tenderness.  Vitals  reviewed and reassuring including O2 sat 98% on room air. Labs obtained in triage reviewed, CBC with normal white blood cell count, hemoglobin 11.3, not significantly changed from prior, CMP without significant electrolyte derangement, normal LFTs.  Urinalysis unremarkable, lipase within normal limits, hCG negative.  Repeat COVID test canceled as patient had a negative COVID test on day 3 or 4 of symptoms already. Discussed discontinuing Mucinex as this may be causing her excess mucus production, no history of hypertension, not hypertensive here today recommend Zyrtec, Flonase, saline sinus rinse and Sudafed.  Will give prescription for Phenergan as Zofran is not helping with her nausea although suspect that her nausea may be coming from her excess nasal secretions.  Recommend recheck with primary care provider.  Follow-up with gonorrhea and Chlamydia testing in MyChart in 3 to 5 days. [LM]    Clinical Course User Index [LM] Jeannie Fend, PA-C [PM] Wynetta Fines, MD   MDM Rules/Calculators/A&P                            Final Clinical Impression(s) / ED Diagnoses Final diagnoses:  Viral upper respiratory tract infection  Generalized abdominal pain    Rx / DC Orders ED Discharge Orders     None        Alden Hipp 06/21/21 1009    Wynetta Fines, MD 06/22/21 2350

## 2021-06-21 NOTE — Discharge Instructions (Addendum)
Discontinue Mucinex.  Recommend saline sinus rinse twice daily.  Use Flonase, Zyrtec, Sudafed as directed.  Obtain the Sudafed from the pharmacist, do not buy the over-the-counter product.  Labs are overall reassuring including pelvic swab.  Follow-up in your MyChart account for your gonorrhea chlamydia test results.  Call your doctors office tomorrow for follow-up visit if you are not improving.

## 2021-06-21 NOTE — ED Triage Notes (Addendum)
Pt states that she's been feeling sick for a week. Pt states that she's been sneezing, having runny nose and chills with no fever. Pt states she's also been nauseated for a week, but with no vomiting. Has not had an appetite. Pt did endorse taking zofran twice yesterday and stated the last time she took it was around 1800. Pt also endorsing copious amounts of clear mucus and post nasal dripping. Pt also endorsing mid abdomen pain and mild left sided chest pain.

## 2021-06-22 LAB — GC/CHLAMYDIA PROBE AMP (~~LOC~~) NOT AT ARMC
Chlamydia: NEGATIVE
Comment: NEGATIVE
Comment: NORMAL
Neisseria Gonorrhea: NEGATIVE

## 2021-07-16 ENCOUNTER — Encounter (HOSPITAL_COMMUNITY): Payer: Self-pay

## 2021-07-16 ENCOUNTER — Ambulatory Visit (HOSPITAL_COMMUNITY)
Admission: EM | Admit: 2021-07-16 | Discharge: 2021-07-16 | Disposition: A | Discharge status: Home / Self Care | Payer: Medicaid Other | Attending: Emergency Medicine | Admitting: Emergency Medicine

## 2021-07-16 ENCOUNTER — Other Ambulatory Visit: Payer: Self-pay

## 2021-07-16 DIAGNOSIS — R053 Chronic cough: Secondary | ICD-10-CM | POA: Diagnosis not present

## 2021-07-16 DIAGNOSIS — Z79899 Other long term (current) drug therapy: Secondary | ICD-10-CM | POA: Insufficient documentation

## 2021-07-16 DIAGNOSIS — R6889 Other general symptoms and signs: Secondary | ICD-10-CM | POA: Diagnosis not present

## 2021-07-16 DIAGNOSIS — R509 Fever, unspecified: Secondary | ICD-10-CM

## 2021-07-16 DIAGNOSIS — U071 COVID-19: Secondary | ICD-10-CM | POA: Insufficient documentation

## 2021-07-16 DIAGNOSIS — R059 Cough, unspecified: Secondary | ICD-10-CM | POA: Diagnosis present

## 2021-07-16 LAB — POC INFLUENZA A AND B ANTIGEN (URGENT CARE ONLY)
INFLUENZA A ANTIGEN, POC: NEGATIVE
INFLUENZA B ANTIGEN, POC: NEGATIVE

## 2021-07-16 LAB — SARS CORONAVIRUS 2 (TAT 6-24 HRS): SARS Coronavirus 2: POSITIVE — AB

## 2021-07-16 NOTE — Discharge Instructions (Addendum)
Cont to use albuterol inhaler  If symptoms become worse go to er  Take tylenol or motrin as needed for fever Will send off covid test which may take 3 days to return

## 2021-07-16 NOTE — ED Triage Notes (Signed)
Pt presents with congestion, fatigue, and chills since yesterday.

## 2021-07-16 NOTE — ED Notes (Signed)
Pt did not answer phone.

## 2021-07-16 NOTE — ED Notes (Signed)
Tried calling patient on phone x2 and from waiting room no answer

## 2021-07-16 NOTE — ED Provider Notes (Signed)
[facility]    CSN: [id number] Arrival date & time: 07/16/21  1158      History   Chief Complaint Chief Complaint  Patient presents with   URI    HPI Ashlyn Cabler is a 30 y.o. female.   Pt here for cough, fever, congestion for 2 days. Was sent home from work needing a covid test. Pt stats that she does have chronic lung issues. Has not taken anything for symptoms.    Past Medical History:  Diagnosis Date   Anemia    Anxiety    Asthma    weather, exercise related; does not use inhaler often   Depression    Gestational diabetes    Infection    UTI   Recurrent upper respiratory infection (URI)    Shingles     Patient Active Problem List   Diagnosis Date Noted   GDM (gestational diabetes mellitus) 01/19/2019   Status post primary low transverse cesarean section 01/19/2019   Encounter for tubal ligation 01/19/2019   LGA (large for gestational age) fetus affecting management of mother 01/17/2019   Gestational diabetes mellitus (GDM) affecting pregnancy, antepartum 11/15/2018   Anemia 11/02/2018   Nausea/vomiting in pregnancy 10/21/2018   Poor weight gain of pregnancy 10/21/2018   Supervision of other normal pregnancy, antepartum 07/05/2018   GBS bacteriuria 06/03/2018    Past Surgical History:  Procedure Laterality Date   CESAREAN SECTION WITH BILATERAL TUBAL LIGATION N/A 01/19/2019   Procedure: CESAREAN SECTION WITH BILATERAL TUBAL LIGATION;  Surgeon: Lazaro Arms, MD;  Location: [facility] LD ORS;  Service: Obstetrics;  Laterality: N/A;   DILATION AND CURETTAGE OF UTERUS     HAND TENDON SURGERY     WISDOM TOOTH EXTRACTION      OB History     Gravida  6   Para  3   Term  3   Preterm      AB  3   Living  3      SAB  1   IAB  2   Ectopic      Multiple  0   Live Births  3            Home Medications    Prior to Admission medications   Medication Sig Start Date End Date Taking? Authorizing Provider  albuterol (PROVENTIL  HFA;VENTOLIN HFA) 108 (90 Base) MCG/ACT inhaler Inhale 2 puffs into the lungs every 6 (six) hours as needed for wheezing or shortness of breath.    [provider]  albuterol (VENTOLIN HFA) 108 (90 Base) MCG/ACT inhaler Inhale 1-2 puffs into the lungs every 6 (six) hours as needed for wheezing or shortness of breath. 03/18/21   Rushie Chestnut, PA-C  amoxicillin-clavulanate (AUGMENTIN) 875-125 MG tablet Take 1 tablet by mouth every 12 (twelve) hours. 10/30/20   Caccavale, Sophia, PA-C  benzonatate (TESSALON) 100 MG capsule Take 1 capsule (100 mg total) by mouth every 8 (eight) hours. 03/18/21   Rushie Chestnut, PA-C  escitalopram (LEXAPRO) 20 MG tablet Take 1 tablet (20 mg total) by mouth daily. 08/20/19   Venora Maples, MD  ferrous sulfate 325 (65 FE) MG tablet TAKE 1 TABLET (325 MG TOTAL) BY MOUTH 2 (TWO) TIMES DAILY WITH A MEAL. 04/16/19   Rhett Bannister S, DO  fluticasone (FLONASE) 50 MCG/ACT nasal spray Place 2 sprays into both nostrils daily. 03/18/21   Rushie Chestnut, PA-C  ibuprofen (ADVIL) 800 MG tablet Take 1 tablet (800 mg total) by mouth  3 (three) times daily. Patient not taking: No sig reported 01/21/19   Tamera Stands, DO  lidocaine (XYLOCAINE) 2 % solution Use as directed 15 mLs in the mouth or throat as needed for mouth pain. 10/30/20   Caccavale, Sophia, PA-C  metroNIDAZOLE (FLAGYL) 500 MG tablet Take 1 tablet (500 mg total) by mouth 2 (two) times daily. 03/20/21   Lamptey, Britta Mccreedy, MD  naproxen (NAPROSYN) 500 MG tablet Take 1 tablet (500 mg total) by mouth 2 (two) times daily with a meal. 10/30/20   Caccavale, Sophia, PA-C  ondansetron (ZOFRAN ODT) 8 MG disintegrating tablet Take 1 tablet (8 mg total) by mouth every 8 (eight) hours as needed for nausea or vomiting. 06/16/21   Rhys Martini, PA-C  predniSONE (STERAPRED UNI-PAK 21 TAB) 10 MG (21) TBPK tablet Take by mouth daily. Take 6 tabs by mouth daily  for 2 days, then 5 tabs for 2 days, then 4 tabs for 2 days,  then 3 tabs for 2 days, 2 tabs for 2 days, then 1 tab by mouth daily for 2 days 03/18/21   Rushie Chestnut, PA-C  Prenatal Vit-Fe Phos-FA-Omega (VITAFOL GUMMIES) 3.33-0.333-34.8 MG CHEW Chew 3 each by mouth daily. Patient not taking: No sig reported 08/02/18   Currie Paris, NP  promethazine (PHENERGAN) 25 MG tablet Take 1 tablet (25 mg total) by mouth every 6 (six) hours as needed for nausea. 10/22/18   Leftwich-Kirby, Wilmer Floor, CNM  senna-docusate (SENOKOT-S) 8.6-50 MG tablet Take 2 tablets by mouth at bedtime as needed for mild constipation. 01/21/19   Tamera Stands, DO    Family History Family History  Problem Relation Age of Onset   Cancer Mother    Early death Mother    Arthritis Father    Diabetes Father    Hypertension Father    Varicose Veins Father     Social History Social History   Tobacco Use   Smoking status: Some Days   Smokeless tobacco: Never   Tobacco comments:    states quit 89yrs ago  Vaping Use   Vaping Use: Never used  Substance Use Topics   Alcohol use: Yes    Comment: rarely   Drug use: Not Currently    Types: Marijuana    Comment: "long time ago"     Allergies   Latex   Review of Systems Review of Systems  Constitutional:  Positive for activity change, chills, fatigue and fever.  HENT:  Positive for congestion, postnasal drip, rhinorrhea, sinus pain and sore throat.   Eyes: Negative.   Respiratory:  Positive for cough, shortness of breath and wheezing.   Cardiovascular: Negative.   Gastrointestinal: Negative.   Genitourinary: Negative.   Neurological: Negative.     Physical Exam Triage Vital Signs ED Triage Vitals [07/16/21 1326]  Enc Vitals Group     BP      Pulse      Resp      Temp      Temp src      SpO2      Weight      Height      Head Circumference      Peak Flow      Pain Score 2     Pain Loc      Pain Edu?      Excl. in GC?    No data found.  Updated Vital Signs BP 108/60 (BP Location: Left Arm)   Pulse  88  Temp 98 F (36.7 C) (Oral)   Resp 17   LMP 07/09/2021   SpO2 99%   Visual Acuity Right Eye Distance:   Left Eye Distance:   Bilateral Distance:    Right Eye Near:   Left Eye Near:    Bilateral Near:     Physical Exam Constitutional:      Appearance: She is ill-appearing.  HENT:     Right Ear: Tympanic membrane normal.     Left Ear: Tympanic membrane normal.     Nose: Congestion present.     Mouth/Throat:     Mouth: Mucous membranes are moist.  Eyes:     Pupils: Pupils are equal, round, and reactive to light.  Cardiovascular:     Rate and Rhythm: Normal rate.  Pulmonary:     Breath sounds: Wheezing and rhonchi present.  Abdominal:     General: Abdomen is flat.  Skin:    General: Skin is warm.     Capillary Refill: Capillary refill takes less than 2 seconds.  Neurological:     General: No focal deficit present.     Mental Status: She is alert.     UC Treatments / Results  Labs (all labs ordered are listed, but only abnormal results are displayed) Labs Reviewed  SARS CORONAVIRUS 2 (TAT 6-24 HRS)  POC INFLUENZA A AND B ANTIGEN (URGENT CARE ONLY)    EKG   Radiology No results found.  Procedures Procedures (including critical care time)  Medications Ordered in UC Medications - No data to display  Initial Impression / Assessment and Plan / UC Course  I have reviewed the triage vital signs and the nursing notes.  Pertinent labs & imaging results that were available during my care of the patient were reviewed by me and considered in my medical decision making (see chart for details).     Cont to use albuterol inhaler  If symptoms become worse go to er  Take tylenol or motrin as needed for fever Will send off covid test which may take 3 days to return    Final Clinical Impressions(s) / UC Diagnoses   Final diagnoses:  Chronic cough  Flu-like symptoms  Fever, unspecified     Discharge Instructions      Cont to use albuterol inhaler  If  symptoms become worse go to er  Take tylenol or motrin as needed for fever Will send off covid test which may take 3 days to return      ED Prescriptions   None    PDMP not reviewed this encounter.   Coralyn Mark, NP 07/16/21 [phone]

## 2021-07-17 ENCOUNTER — Ambulatory Visit (HOSPITAL_COMMUNITY)
Admission: EM | Admit: 2021-07-17 | Discharge: 2021-07-17 | Disposition: A | Discharge status: Home / Self Care | Payer: Medicaid Other | Attending: Physician Assistant | Admitting: Physician Assistant

## 2021-07-17 ENCOUNTER — Encounter (HOSPITAL_COMMUNITY): Payer: Self-pay

## 2021-07-17 ENCOUNTER — Other Ambulatory Visit: Payer: Self-pay

## 2021-07-17 DIAGNOSIS — U071 COVID-19: Secondary | ICD-10-CM

## 2021-07-17 DIAGNOSIS — G44209 Tension-type headache, unspecified, not intractable: Secondary | ICD-10-CM

## 2021-07-17 DIAGNOSIS — R051 Acute cough: Secondary | ICD-10-CM

## 2021-07-17 DIAGNOSIS — R0602 Shortness of breath: Secondary | ICD-10-CM

## 2021-07-17 MED ORDER — PROMETHAZINE-DM 6.25-15 MG/5ML PO SYRP: 5.0000 mL | ORAL_SOLUTION | Freq: Three times a day (TID) | ORAL | 0 refills | Status: DC | PRN

## 2021-07-17 MED ORDER — ALBUTEROL SULFATE HFA 108 (90 BASE) MCG/ACT IN AERS: 1.0000 | INHALATION_SPRAY | Freq: Four times a day (QID) | RESPIRATORY_TRACT | 0 refills | Status: AC | PRN

## 2021-07-17 MED ORDER — PREDNISONE 10 MG (21) PO TBPK: ORAL_TABLET | ORAL | 0 refills | Status: DC

## 2021-07-17 NOTE — Discharge Instructions (Signed)
Start prednisone taper as prescribed.  Do not take NSAIDs with this medication due to risk of GI bleeding which includes aspirin, ibuprofen/Advil, naproxen/Aleve.  You can use Tylenol for breakthrough pain.  Use albuterol inhaler every 4-6 hours as needed for shortness of breath.  Use Promethazine DM for cough.  You can use Mucinex and Flonase for additional symptom relief.  Make sure you rest and drink plenty of fluid.  If you have any worsening symptoms including high fever not responding to medication, shortness of breath, chest discomfort, nausea/vomiting interfering with oral intake you need to go to the emergency room.

## 2021-07-17 NOTE — ED Provider Notes (Signed)
[facility]    CSN: [id number] Arrival date & time: 07/17/21  1200      History   Chief Complaint Chief Complaint  Patient presents with   Covid Positive   Fever    HPI Phyllis Phillips is a 30 y.o. female.   Patient presents today with a 2 to 3-day history of URI symptoms.  She was seen by our clinic yesterday (07/16/2021) at which point she tested positive for COVID-19.  She presents today for further evaluation and treatment.  Reports ongoing chills, fever, cough, shortness of breath, headache.  Denies any nausea, vomiting, chest pain, dizziness, syncope.  She has been using NyQuil, Tylenol, Motrin, BC powder without improvement of symptoms.  She did try to use TheraFlu but found this made her shortness of breath worse she stopped using it.  She does have a history of allergies taking albuterol as needed.  Denies history of diabetes but did have gestational diabetes.  She denies any recent antibiotic use.  She is up-to-date on COVID-19 vaccination.  She has not had COVID in the past.  She is requesting updated work excuse note indicating she is positive for COVID-19.   Past Medical History:  Diagnosis Date   Anemia    Anxiety    Asthma    weather, exercise related; does not use inhaler often   Depression    Gestational diabetes    Infection    UTI   Recurrent upper respiratory infection (URI)    Shingles     Patient Active Problem List   Diagnosis Date Noted   GDM (gestational diabetes mellitus) 01/19/2019   Status post primary low transverse cesarean section 01/19/2019   Encounter for tubal ligation 01/19/2019   LGA (large for gestational age) fetus affecting management of mother 01/17/2019   Gestational diabetes mellitus (GDM) affecting pregnancy, antepartum 11/15/2018   Anemia 11/02/2018   Nausea/vomiting in pregnancy 10/21/2018   Poor weight gain of pregnancy 10/21/2018   Supervision of other normal pregnancy, antepartum 07/05/2018   GBS bacteriuria  06/03/2018    Past Surgical History:  Procedure Laterality Date   CESAREAN SECTION WITH BILATERAL TUBAL LIGATION N/A 01/19/2019   Procedure: CESAREAN SECTION WITH BILATERAL TUBAL LIGATION;  Surgeon: Lazaro Arms, MD;  Location: [facility];  Service: Obstetrics;  Laterality: N/A;   DILATION AND CURETTAGE OF UTERUS     HAND TENDON SURGERY     WISDOM TOOTH EXTRACTION      OB History     Gravida  6   Para  3   Term  3   Preterm      AB  3   Living  3      SAB  1   IAB  2   Ectopic      Multiple  0   Live Births  3            Home Medications    Prior to Admission medications   Medication Sig Start Date End Date Taking? Authorizing Provider  predniSONE (STERAPRED UNI-PAK 21 TAB) 10 MG (21) TBPK tablet As directed 07/17/21  Yes Suvi Archuletta K, PA-C  promethazine-dextromethorphan (PROMETHAZINE-DM) 6.25-15 MG/5ML syrup Take 5 mLs by mouth 3 (three) times daily as needed for cough. 07/17/21  Yes Johnney Scarlata K, PA-C  albuterol (VENTOLIN HFA) 108 (90 Base) MCG/ACT inhaler Inhale 1-2 puffs into the lungs every 6 (six) hours as needed for wheezing or shortness of breath. 07/17/21   Chakara Bognar, Noberto Retort, PA-C  escitalopram (LEXAPRO) 20 MG tablet Take 1 tablet (20 mg total) by mouth daily. 08/20/19   Venora Maples, MD  ferrous sulfate 325 (65 FE) MG tablet TAKE 1 TABLET (325 MG TOTAL) BY MOUTH 2 (TWO) TIMES DAILY WITH A MEAL. 04/16/19   Rhett Bannister S, DO  ibuprofen (ADVIL) 800 MG tablet Take 1 tablet (800 mg total) by mouth 3 (three) times daily. Patient not taking: No sig reported 01/21/19   Tamera Stands, DO  senna-docusate (SENOKOT-S) 8.6-50 MG tablet Take 2 tablets by mouth at bedtime as needed for mild constipation. 01/21/19   Tamera Stands, DO    Family History Family History  Problem Relation Age of Onset   Cancer Mother    Early death Mother    Arthritis Father    Diabetes Father    Hypertension Father    Varicose Veins Father     Social  History Social History   Tobacco Use   Smoking status: Some Days   Smokeless tobacco: Never   Tobacco comments:    states quit 24yrs ago  Vaping Use   Vaping Use: Never used  Substance Use Topics   Alcohol use: Yes    Comment: rarely   Drug use: Not Currently    Types: Marijuana    Comment: "long time ago"     Allergies   Latex   Review of Systems Review of Systems  Constitutional:  Positive for activity change, fatigue and fever. Negative for appetite change.  HENT:  Positive for congestion, postnasal drip and sinus pressure. Negative for sneezing and sore throat.   Respiratory:  Positive for cough and shortness of breath. Negative for chest tightness.   Cardiovascular:  Negative for chest pain.  Gastrointestinal:  Negative for abdominal pain, diarrhea, nausea and vomiting.  Neurological:  Positive for headaches. Negative for dizziness and light-headedness.    Physical Exam Triage Vital Signs ED Triage Vitals  Enc Vitals Group     BP 07/17/21 1344 117/76     Pulse Rate 07/17/21 1344 91     Resp 07/17/21 1344 18     Temp 07/17/21 1344 99.4 F (37.4 C)     Temp Source 07/17/21 1344 Oral     SpO2 07/17/21 1344 96 %     Weight --      Height --      Head Circumference --      Peak Flow --      Pain Score 07/17/21 1345 6     Pain Loc --      Pain Edu? --      Excl. in GC? --    No data found.  Updated Vital Signs BP 117/76 (BP Location: Right Arm)   Pulse 91   Temp 99.4 F (37.4 C) (Oral)   Resp 18   LMP 07/09/2021   SpO2 96%   Breastfeeding No   Visual Acuity Right Eye Distance:   Left Eye Distance:   Bilateral Distance:    Right Eye Near:   Left Eye Near:    Bilateral Near:     Physical Exam Vitals reviewed.  Constitutional:      General: She is awake. She is not in acute distress.    Appearance: Normal appearance. She is well-developed. She is not ill-appearing.     Comments: Very pleasant female appears stated age in no acute distress   HENT:     Head: Normocephalic and atraumatic.     Right Ear: Tympanic membrane, ear  canal and external ear normal. Tympanic membrane is not erythematous or bulging.     Left Ear: Tympanic membrane, ear canal and external ear normal. Tympanic membrane is not erythematous or bulging.     Nose:     Right Sinus: Maxillary sinus tenderness present. No frontal sinus tenderness.     Left Sinus: Maxillary sinus tenderness present. No frontal sinus tenderness.     Mouth/Throat:     Pharynx: Uvula midline. No oropharyngeal exudate or posterior oropharyngeal erythema.  Cardiovascular:     Rate and Rhythm: Normal rate and regular rhythm.     Heart sounds: Normal heart sounds, S1 normal and S2 normal. No murmur heard. Pulmonary:     Effort: Pulmonary effort is normal.     Breath sounds: Normal breath sounds. No wheezing, rhonchi or rales.     Comments: Clear to auscultation bilaterally.  Reactive cough with deep breathing. Psychiatric:        Behavior: Behavior is cooperative.     UC Treatments / Results  Labs (all labs ordered are listed, but only abnormal results are displayed) Labs Reviewed - No data to display  EKG   Radiology No results found.  Procedures Procedures (including critical care time)  Medications Ordered in UC Medications - No data to display  Initial Impression / Assessment and Plan / UC Course  I have reviewed the triage vital signs and the nursing notes.  Pertinent labs & imaging results that were available during my care of the patient were reviewed by me and considered in my medical decision making (see chart for details).     Patient tested positive for COVID-19 yesterday.  We discussed potential utility of antiviral medication such as Paxlovid given history of asthma.  Discussed the utility of prednisone given shortness of breath and reactive cough that this cannot be taken with Paxlovid.  Patient referred prednisone as discussed given history of asthma as  directed not to take NSAIDs with prednisone due to risk of GI bleeding.  She was given Promethazine DM for cough with instruction not to drive or drink alcohol while taking this.  Can use over-the-counter medications including Tylenol, Mucinex, Flonase for additional symptom relief.  Discussed alarm symptoms that warrant emergent evaluation.  Updated work note with COVID return to work guidelines given to patient.  Strict return precautions given to which she expressed understanding.  Final Clinical Impressions(s) / UC Diagnoses   Final diagnoses:  COVID-19  Tension-type headache, not intractable, unspecified chronicity pattern  Acute cough  SOB (shortness of breath)     Discharge Instructions      Start prednisone taper as prescribed.  Do not take NSAIDs with this medication due to risk of GI bleeding which includes aspirin, ibuprofen/Advil, naproxen/Aleve.  You can use Tylenol for breakthrough pain.  Use albuterol inhaler every 4-6 hours as needed for shortness of breath.  Use Promethazine DM for cough.  You can use Mucinex and Flonase for additional symptom relief.  Make sure you rest and drink plenty of fluid.  If you have any worsening symptoms including high fever not responding to medication, shortness of breath, chest discomfort, nausea/vomiting interfering with oral intake you need to go to the emergency room.     ED Prescriptions     Medication Sig Dispense Auth. Provider   albuterol (VENTOLIN HFA) 108 (90 Base) MCG/ACT inhaler Inhale 1-2 puffs into the lungs every 6 (six) hours as needed for wheezing or shortness of breath. 8 g Kamerin Axford K, PA-C  predniSONE (STERAPRED UNI-PAK 21 TAB) 10 MG (21) TBPK tablet As directed 21 tablet Kamsiyochukwu Buist K, PA-C   promethazine-dextromethorphan (PROMETHAZINE-DM) 6.25-15 MG/5ML syrup Take 5 mLs by mouth 3 (three) times daily as needed for cough. 118 mL Shalayah Beagley K, PA-C      PDMP not reviewed this encounter.   Jeani Hawking,  PA-C 07/17/21 1422

## 2021-07-17 NOTE — ED Triage Notes (Signed)
Pt c/o fever, cough, headache x2 days. Pt states received a call today states she is covid positive and needs to return for treatment.

## 2021-08-13 ENCOUNTER — Encounter (HOSPITAL_COMMUNITY): Payer: Self-pay

## 2021-08-13 ENCOUNTER — Ambulatory Visit (HOSPITAL_COMMUNITY)
Admission: EM | Admit: 2021-08-13 | Discharge: 2021-08-13 | Disposition: A | Discharge status: Home / Self Care | Payer: Medicaid Other | Attending: Family Medicine | Admitting: Family Medicine

## 2021-08-13 ENCOUNTER — Other Ambulatory Visit: Payer: Self-pay

## 2021-08-13 DIAGNOSIS — J45901 Unspecified asthma with (acute) exacerbation: Secondary | ICD-10-CM

## 2021-08-13 DIAGNOSIS — J209 Acute bronchitis, unspecified: Secondary | ICD-10-CM

## 2021-08-13 LAB — POC INFLUENZA A AND B ANTIGEN (URGENT CARE ONLY)
INFLUENZA A ANTIGEN, POC: NEGATIVE
INFLUENZA B ANTIGEN, POC: NEGATIVE

## 2021-08-13 MED ORDER — PREDNISONE 20 MG PO TABS: 40.0000 mg | ORAL_TABLET | Freq: Every day | ORAL | 0 refills | Status: AC

## 2021-08-13 MED ORDER — ALBUTEROL SULFATE HFA 108 (90 BASE) MCG/ACT IN AERS
INHALATION_SPRAY | RESPIRATORY_TRACT | Status: AC
  Filled 2021-08-13: qty 6.7

## 2021-08-13 MED ORDER — ALBUTEROL SULFATE HFA 108 (90 BASE) MCG/ACT IN AERS
2.0000 | INHALATION_SPRAY | Freq: Once | RESPIRATORY_TRACT | Status: AC
  Administered 2021-08-13: 11:00:00 2 via RESPIRATORY_TRACT

## 2021-08-13 NOTE — ED Provider Notes (Signed)
[facility]    CSN: [id number] Arrival date & time: 08/13/21  8546      History   Chief Complaint Chief Complaint  Patient presents with   Cough    HPI Phyllis Phillips is a 30 y.o. female.   HPI Patient status post recent COVID-19 infection presents today with coughing, shortness of breath congestion.  Both of her children are infected with influenza virus.  She endorses history of asthma which is exacerbated by seasonal changes. Denies wheezing or shortness of breath. She has not had fever.  Past Medical History:  Diagnosis Date   Anemia    Anxiety    Asthma    weather, exercise related; does not use inhaler often   Depression    Gestational diabetes    Infection    UTI   Recurrent upper respiratory infection (URI)    Shingles     Patient Active Problem List   Diagnosis Date Noted   GDM (gestational diabetes mellitus) 01/19/2019   Status post primary low transverse cesarean section 01/19/2019   Encounter for tubal ligation 01/19/2019   LGA (large for gestational age) fetus affecting management of mother 01/17/2019   Gestational diabetes mellitus (GDM) affecting pregnancy, antepartum 11/15/2018   Anemia 11/02/2018   Nausea/vomiting in pregnancy 10/21/2018   Poor weight gain of pregnancy 10/21/2018   Supervision of other normal pregnancy, antepartum 07/05/2018   GBS bacteriuria 06/03/2018    Past Surgical History:  Procedure Laterality Date   CESAREAN SECTION WITH BILATERAL TUBAL LIGATION N/A 01/19/2019   Procedure: CESAREAN SECTION WITH BILATERAL TUBAL LIGATION;  Surgeon: Lazaro Arms, MD;  Location: [facility];  Service: Obstetrics;  Laterality: N/A;   DILATION AND CURETTAGE OF UTERUS     HAND TENDON SURGERY     WISDOM TOOTH EXTRACTION      OB History     Gravida  6   Para  3   Term  3   Preterm      AB  3   Living  3      SAB  1   IAB  2   Ectopic      Multiple  0   Live Births  3            Home Medications     Prior to Admission medications   Medication Sig Start Date End Date Taking? Authorizing Provider  predniSONE (DELTASONE) 20 MG tablet Take 2 tablets (40 mg total) by mouth daily with breakfast. 08/13/21  Yes Bing Neighbors, FNP  albuterol (VENTOLIN HFA) 108 (90 Base) MCG/ACT inhaler Inhale 1-2 puffs into the lungs every 6 (six) hours as needed for wheezing or shortness of breath. 07/17/21   Raspet, Erin K, PA-C  escitalopram (LEXAPRO) 20 MG tablet Take 1 tablet (20 mg total) by mouth daily. 08/20/19   Venora Maples, MD  ferrous sulfate 325 (65 FE) MG tablet TAKE 1 TABLET (325 MG TOTAL) BY MOUTH 2 (TWO) TIMES DAILY WITH A MEAL. 04/16/19   Rhett Bannister S, DO  ibuprofen (ADVIL) 800 MG tablet Take 1 tablet (800 mg total) by mouth 3 (three) times daily. Patient not taking: No sig reported 01/21/19   Tamera Stands, DO  senna-docusate (SENOKOT-S) 8.6-50 MG tablet Take 2 tablets by mouth at bedtime as needed for mild constipation. 01/21/19   Tamera Stands, DO    Family History Family History  Problem Relation Age of Onset   Cancer Mother    Early death  Mother    Arthritis Father    Diabetes Father    Hypertension Father    Varicose Veins Father     Social History Social History   Tobacco Use   Smoking status: Some Days   Smokeless tobacco: Never   Tobacco comments:    states quit 16yrs ago  Vaping Use   Vaping Use: Never used  Substance Use Topics   Alcohol use: Yes    Comment: rarely   Drug use: Not Currently    Types: Marijuana    Comment: "long time ago"     Allergies   Latex   Review of Systems Review of Systems Pertinent negatives listed in HPI  Physical Exam Triage Vital Signs ED Triage Vitals  Enc Vitals Group     BP 08/13/21 1026 105/65     Pulse Rate 08/13/21 1026 74     Resp 08/13/21 1026 18     Temp 08/13/21 1026 98 F (36.7 C)     Temp Source 08/13/21 1026 Oral     SpO2 08/13/21 1026 98 %     Weight --      Height --      Head  Circumference --      Peak Flow --      Pain Score 08/13/21 1027 6     Pain Loc --      Pain Edu? --      Excl. in GC? --    No data found.  Updated Vital Signs BP 105/65 (BP Location: Right Arm)   Pulse 74   Temp 98 F (36.7 C) (Oral)   Resp 18   LMP 08/04/2021   SpO2 98%   Visual Acuity Right Eye Distance:   Left Eye Distance:   Bilateral Distance:    Right Eye Near:   Left Eye Near:    Bilateral Near:     Physical Exam General appearance: alert, Ill-appearing, no distress Head: Normocephalic, without obvious abnormality, atraumatic ENT: mucosal edema, congestion, erythematous oropharynx w/o exudate Respiratory: Respirations even , unlabored, coarse lung sound, expiratory wheeze Heart: rate and rhythm normal. No gallop or murmurs noted on exam  Abdomen: BS +, no distention, no rebound tenderness, or no mass Extremities: No gross deformities Skin: Skin color, texture, turgor normal. No rashes seen  Neurologic: No neurological focal abnormalities  Psych: Appropriate mood and affect. UC Treatments / Results  Labs (all labs ordered are listed, but only abnormal results are displayed) Labs Reviewed  POC INFLUENZA A AND B ANTIGEN (URGENT CARE ONLY)    EKG   Radiology No results found.  Procedures Procedures (including critical care time)  Medications Ordered in UC Medications  albuterol (VENTOLIN HFA) 108 (90 Base) MCG/ACT inhaler 2 puff (2 puffs Inhalation Given 08/13/21 1112)    Initial Impression / Assessment and Plan / UC Course  I have reviewed the triage vital signs and the nursing notes.  Pertinent labs & imaging results that were available during my care of the patient were reviewed by me and considered in my medical decision making (see chart for details).    Acute bronchitis with asthma exacerbation Treatment today with prednisone 40 mg once daily with breakfast  Continue albuterol inhaler 2 puffs every 4-6 hours as needed Discussed red flag  symptoms that warrant immediate evaluation in setting of the ER Influenza is negative. Return precautions given Final Clinical Impressions(s) / UC Diagnoses   Final diagnoses:  Exacerbation of asthma, unspecified asthma severity, unspecified whether persistent  Acute  bronchitis, unspecified organism     Discharge Instructions      Your flu test is negative.   Start prednisone and use inhaler 2 puffs every 4-6 hours as needed for shortness of breath. You may return to work on Monday. Drink plenty of fluids.     ED Prescriptions     Medication Sig Dispense Auth. Provider   predniSONE (DELTASONE) 20 MG tablet Take 2 tablets (40 mg total) by mouth daily with breakfast. 10 tablet Bing Neighbors, FNP      PDMP not reviewed this encounter.   Bing Neighbors, FNP 08/13/21 1227

## 2021-08-13 NOTE — Discharge Instructions (Addendum)
Your flu test is negative.   Start prednisone and use inhaler 2 puffs every 4-6 hours as needed for shortness of breath. You may return to work on Monday. Drink plenty of fluids.

## 2021-08-13 NOTE — ED Triage Notes (Signed)
Pt c/o cough, chills, headache, and body aches since yesterday. States her daughter is flu positive.

## 2021-09-28 ENCOUNTER — Other Ambulatory Visit: Payer: Self-pay

## 2021-09-28 ENCOUNTER — Ambulatory Visit (HOSPITAL_COMMUNITY)
Admission: EM | Admit: 2021-09-28 | Discharge: 2021-09-28 | Disposition: A | Discharge status: Home / Self Care | Payer: PRIVATE HEALTH INSURANCE | Attending: Internal Medicine | Admitting: Internal Medicine

## 2021-09-28 DIAGNOSIS — N760 Acute vaginitis: Secondary | ICD-10-CM | POA: Diagnosis present

## 2021-09-28 LAB — HIV ANTIBODY (ROUTINE TESTING W REFLEX): HIV Screen 4th Generation wRfx: NONREACTIVE

## 2021-09-28 NOTE — Discharge Instructions (Addendum)
Our staff will call you if tests are positive.

## 2021-09-28 NOTE — ED Triage Notes (Signed)
Pt presented to the office today for STI exposure, She would like to have blood work with her cultures.

## 2021-09-29 LAB — CERVICOVAGINAL ANCILLARY ONLY
Bacterial Vaginitis (gardnerella): POSITIVE — AB
Candida Glabrata: NEGATIVE
Candida Vaginitis: NEGATIVE
Chlamydia: NEGATIVE
Comment: NEGATIVE
Comment: NEGATIVE
Comment: NEGATIVE
Comment: NEGATIVE
Comment: NEGATIVE
Comment: NORMAL
Neisseria Gonorrhea: NEGATIVE
Trichomonas: NEGATIVE

## 2021-09-29 LAB — RPR: RPR Ser Ql: NONREACTIVE

## 2021-09-30 NOTE — ED Provider Notes (Signed)
[facility]    CSN: [id number] Arrival date & time: 09/28/21  1626      History   Chief Complaint Chief Complaint  Patient presents with   SEXUALLY TRANSMITTED DISEASE    HPI Elyssa Pendelton is a 31 y.o. female comes to the urgent care for evaluation.  Patient has been engaging in unprotected sexual intercourse with her partner.  She would like to be evaluated for STIs.  Patient denies any abdominal pain.  She has mild irritation with minimal discharge.  No dysuria urgency or frequency.  No burning on micturition.Marland Kitchen   HPI  Past Medical History:  Diagnosis Date   Anemia    Anxiety    Asthma    weather, exercise related; does not use inhaler often   Depression    Gestational diabetes    Infection    UTI   Recurrent upper respiratory infection (URI)    Shingles     Patient Active Problem List   Diagnosis Date Noted   GDM (gestational diabetes mellitus) 01/19/2019   Status post primary low transverse cesarean section 01/19/2019   Encounter for tubal ligation 01/19/2019   LGA (large for gestational age) fetus affecting management of mother 01/17/2019   Gestational diabetes mellitus (GDM) affecting pregnancy, antepartum 11/15/2018   Anemia 11/02/2018   Nausea/vomiting in pregnancy 10/21/2018   Poor weight gain of pregnancy 10/21/2018   Supervision of other normal pregnancy, antepartum 07/05/2018   GBS bacteriuria 06/03/2018    Past Surgical History:  Procedure Laterality Date   CESAREAN SECTION WITH BILATERAL TUBAL LIGATION N/A 01/19/2019   Procedure: CESAREAN SECTION WITH BILATERAL TUBAL LIGATION;  Surgeon: Lazaro Arms, MD;  Location: [facility];  Service: Obstetrics;  Laterality: N/A;   DILATION AND CURETTAGE OF UTERUS     HAND TENDON SURGERY     WISDOM TOOTH EXTRACTION      OB History     Gravida  6   Para  3   Term  3   Preterm      AB  3   Living  3      SAB  1   IAB  2   Ectopic      Multiple  0   Live Births  3             Home Medications    Prior to Admission medications   Medication Sig Start Date End Date Taking? Authorizing Provider  albuterol (VENTOLIN HFA) 108 (90 Base) MCG/ACT inhaler Inhale 1-2 puffs into the lungs every 6 (six) hours as needed for wheezing or shortness of breath. 07/17/21   Raspet, Erin K, PA-C  escitalopram (LEXAPRO) 20 MG tablet Take 1 tablet (20 mg total) by mouth daily. 08/20/19   Venora Maples, MD  ferrous sulfate 325 (65 FE) MG tablet TAKE 1 TABLET (325 MG TOTAL) BY MOUTH 2 (TWO) TIMES DAILY WITH A MEAL. 04/16/19   Rhett Bannister S, DO  ibuprofen (ADVIL) 800 MG tablet Take 1 tablet (800 mg total) by mouth 3 (three) times daily. Patient not taking: No sig reported 01/21/19   Tamera Stands, DO  predniSONE (DELTASONE) 20 MG tablet Take 2 tablets (40 mg total) by mouth daily with breakfast. 08/13/21   Bing Neighbors, FNP  senna-docusate (SENOKOT-S) 8.6-50 MG tablet Take 2 tablets by mouth at bedtime as needed for mild constipation. 01/21/19   Tamera Stands, DO    Family History Family History  Problem Relation Age of Onset  Cancer Mother    Early death Mother    Arthritis Father    Diabetes Father    Hypertension Father    Varicose Veins Father     Social History Social History   Tobacco Use   Smoking status: Some Days   Smokeless tobacco: Never   Tobacco comments:    states quit 73yrs ago  Vaping Use   Vaping Use: Never used  Substance Use Topics   Alcohol use: Yes    Comment: rarely   Drug use: Not Currently    Types: Marijuana    Comment: "long time ago"     Allergies   Latex   Review of Systems Review of Systems As per HPI  Physical Exam Triage Vital Signs ED Triage Vitals  Enc Vitals Group     BP 09/28/21 1949 116/75     Pulse Rate 09/28/21 1949 65     Resp 09/28/21 1949 16     Temp 09/28/21 1949 98.1 F (36.7 C)     Temp src --      SpO2 09/28/21 1949 100 %     Weight --      Height --      Head Circumference --       Peak Flow --      Pain Score 09/28/21 2119 0     Pain Loc --      Pain Edu? --      Excl. in GC? --    No data found.  Updated Vital Signs BP 116/75 (BP Location: Left Arm)    Pulse 65    Temp 98.1 F (36.7 C)    Resp 16    SpO2 100%   Visual Acuity Right Eye Distance:   Left Eye Distance:   Bilateral Distance:    Right Eye Near:   Left Eye Near:    Bilateral Near:     Physical Exam Cardiovascular:     Rate and Rhythm: Normal rate and regular rhythm.  Pulmonary:     Effort: Pulmonary effort is normal.     Breath sounds: Normal breath sounds.  Abdominal:     General: Abdomen is flat. Bowel sounds are normal.     Palpations: There is no mass.     Tenderness: There is no abdominal tenderness. There is no guarding.     Hernia: No hernia is present.     UC Treatments / Results  Labs (all labs ordered are listed, but only abnormal results are displayed)   EKG   Radiology No results found.  Procedures Procedures (including critical care time)  Medications Ordered in UC Medications - No data to display  Initial Impression / Assessment and Plan / UC Course  I have reviewed the triage vital signs and the nursing notes.  Pertinent labs & imaging results that were available during my care of the patient were reviewed by me and considered in my medical decision making (see chart for details).     1.  Acute vaginitis: Cervical vaginal swab for GC/chlamydia/trichomonas/bacterial vaginosis/vaginal yeast HIV/RPR We will call patient with recommendations if labs are abnormal Return to urgent care if symptoms worsen Safe sex practices advised. Final Clinical Impressions(s) / UC Diagnoses   Final diagnoses:  Acute vaginitis     Discharge Instructions      Our staff will call you if tests are positive.    ED Prescriptions   None    PDMP not reviewed this encounter.   Merrilee Jansky,  MD 09/30/21 1705

## 2021-10-02 ENCOUNTER — Telehealth (HOSPITAL_COMMUNITY): Payer: Self-pay | Admitting: Emergency Medicine

## 2021-10-02 MED ORDER — METRONIDAZOLE 500 MG PO TABS: 500.0000 mg | ORAL_TABLET | Freq: Two times a day (BID) | ORAL | 0 refills | Status: DC

## 2022-05-17 ENCOUNTER — Other Ambulatory Visit: Payer: Self-pay

## 2022-05-17 ENCOUNTER — Emergency Department (HOSPITAL_COMMUNITY): Payer: Medicaid Other

## 2022-05-17 ENCOUNTER — Emergency Department (HOSPITAL_COMMUNITY)
Admission: EM | Admit: 2022-05-17 | Discharge: 2022-05-18 | Disposition: A | Discharge status: Home / Self Care | Payer: Medicaid Other | Attending: Emergency Medicine | Admitting: Emergency Medicine

## 2022-05-17 ENCOUNTER — Encounter (HOSPITAL_COMMUNITY): Payer: Self-pay | Admitting: Emergency Medicine

## 2022-05-17 DIAGNOSIS — Z79899 Other long term (current) drug therapy: Secondary | ICD-10-CM | POA: Insufficient documentation

## 2022-05-17 DIAGNOSIS — Z91040 Latex allergy status: Secondary | ICD-10-CM | POA: Insufficient documentation

## 2022-05-17 DIAGNOSIS — A5901 Trichomonal vulvovaginitis: Secondary | ICD-10-CM | POA: Insufficient documentation

## 2022-05-17 DIAGNOSIS — N739 Female pelvic inflammatory disease, unspecified: Secondary | ICD-10-CM | POA: Diagnosis not present

## 2022-05-17 DIAGNOSIS — R109 Unspecified abdominal pain: Secondary | ICD-10-CM | POA: Diagnosis present

## 2022-05-17 LAB — COMPREHENSIVE METABOLIC PANEL
ALT: 14 U/L (ref 0–44)
AST: 16 U/L (ref 15–41)
Albumin: 3.3 g/dL — ABNORMAL LOW (ref 3.5–5.0)
Alkaline Phosphatase: 58 U/L (ref 38–126)
Anion gap: 5 (ref 5–15)
BUN: 11 mg/dL (ref 6–20)
CO2: 25 mmol/L (ref 22–32)
Calcium: 8.7 mg/dL — ABNORMAL LOW (ref 8.9–10.3)
Chloride: 108 mmol/L (ref 98–111)
Creatinine, Ser: 0.93 mg/dL (ref 0.44–1.00)
GFR, Estimated: >60 mL/min (ref >60)
Glucose, Bld: 104 mg/dL — ABNORMAL HIGH (ref 70–99)
Potassium: 3.6 mmol/L (ref 3.5–5.1)
Sodium: 138 mmol/L (ref 135–145)
Total Bilirubin: 0.5 mg/dL (ref 0.3–1.2)
Total Protein: 5.9 g/dL — ABNORMAL LOW (ref 6.5–8.1)

## 2022-05-17 LAB — CBC
HCT: 35.2 % — ABNORMAL LOW (ref 36.0–46.0)
Hemoglobin: 11.4 g/dL — ABNORMAL LOW (ref 12.0–15.0)
MCH: 27 pg (ref 26.0–34.0)
MCHC: 32.4 g/dL (ref 30.0–36.0)
MCV: 83.2 fL (ref 80.0–100.0)
Platelets: 238 10*3/uL (ref 150–400)
RBC: 4.23 MIL/uL (ref 3.87–5.11)
RDW: 14.5 % (ref 11.5–15.5)
WBC: 8 10*3/uL (ref 4.0–10.5)
nRBC: 0 % (ref 0.0–0.2)

## 2022-05-17 LAB — I-STAT BETA HCG BLOOD, ED (MC, WL, AP ONLY): I-stat hCG, quantitative: <5 m[IU]/mL (ref <5)

## 2022-05-17 LAB — LIPASE, BLOOD: Lipase: 43 U/L (ref 11–51)

## 2022-05-17 MED ORDER — OXYCODONE-ACETAMINOPHEN 5-325 MG PO TABS
1.0000 | ORAL_TABLET | Freq: Once | ORAL | Status: AC
  Administered 2022-05-17: 1 via ORAL
  Filled 2022-05-17: qty 1

## 2022-05-17 MED ORDER — ONDANSETRON 4 MG PO TBDP
4.0000 mg | ORAL_TABLET | Freq: Once | ORAL | Status: AC | PRN
  Administered 2022-05-17: 4 mg via ORAL
  Filled 2022-05-17: qty 1

## 2022-05-17 NOTE — ED Provider Triage Note (Signed)
Emergency Medicine Provider Triage Evaluation Note  Phyllis Phillips , a 31 y.o. female  was evaluated in triage.  Pt complains of dry, vomiting, lower abdominal pain.  Reports the pain radiates to her back.  She reports that she has been taking pain medicine at home but it is not working, she works that she would like something stronger for pain, as well as nausea at this time.  She denies dysuria but does endorse some vaginal discharge.  She reports that she has had the same sexual partner for a long time, but it is possible that she could have an STI he is not being faithful.  She denies foul-smelling discharge, vaginal bleeding, reports that she does not think that she could be pregnant.  She denies any hematemesis, hematuria.  Review of Systems  Positive: Abdominal pain, nausea, vomiting, vaginal discharge Negative: Dysuria, hematuria, hematemesis, fever, chills  Physical Exam  BP 120/68 (BP Location: Left Arm)   Pulse 84   Temp 98.4 F (36.9 C) (Oral)   Resp 16   SpO2 99%  Gen:   Awake, no distress   Resp:  Normal effort  MSK:   Moves extremities without difficulty  Other:  Patient is most focally tender in the lower quadrants of the abdomen and suprapubically.  No rebound, rigidity, guarding throughout.  No flank pain or CVA tenderness noted.  Medical Decision Making  Medically screening exam initiated at 9:24 PM.  Appropriate orders placed.  Phyllis Phillips was informed that the remainder of the evaluation will be completed by another provider, this initial triage assessment does not replace that evaluation, and the importance of remaining in the ED until their evaluation is complete.  Work-up initiated   Olene Floss, [place] 05/17/22 2127

## 2022-05-17 NOTE — ED Triage Notes (Signed)
Patient reports pain across her abdomen with emesis and diarrhea , body aches , legs pain  and fatigue onset yesterday .

## 2022-05-18 ENCOUNTER — Emergency Department (HOSPITAL_COMMUNITY): Payer: Medicaid Other

## 2022-05-18 ENCOUNTER — Encounter (HOSPITAL_COMMUNITY): Payer: Self-pay | Admitting: Student

## 2022-05-18 LAB — GC/CHLAMYDIA PROBE AMP (~~LOC~~) NOT AT ARMC
Chlamydia: POSITIVE — AB
Comment: NEGATIVE
Comment: NORMAL
Neisseria Gonorrhea: NEGATIVE

## 2022-05-18 LAB — HIV ANTIBODY (ROUTINE TESTING W REFLEX): HIV Screen 4th Generation wRfx: NONREACTIVE

## 2022-05-18 LAB — URINALYSIS, ROUTINE W REFLEX MICROSCOPIC
Bilirubin Urine: NEGATIVE
Glucose, UA: NEGATIVE mg/dL
Hgb urine dipstick: NEGATIVE
Ketones, ur: NEGATIVE mg/dL
Nitrite: NEGATIVE
Protein, ur: NEGATIVE mg/dL
Specific Gravity, Urine: 1.029 (ref 1.005–1.030)
pH: 5 (ref 5.0–8.0)

## 2022-05-18 LAB — WET PREP, GENITAL
Clue Cells Wet Prep HPF POC: NONE SEEN
Sperm: NONE SEEN
WBC, Wet Prep HPF POC: >=10 — AB (ref <10)
Yeast Wet Prep HPF POC: NONE SEEN

## 2022-05-18 MED ORDER — HYDROMORPHONE HCL 1 MG/ML IJ SOLN
0.5000 mg | Freq: Once | INTRAMUSCULAR | Status: AC
  Administered 2022-05-18: 0.5 mg via INTRAVENOUS
  Filled 2022-05-18: qty 1

## 2022-05-18 MED ORDER — ONDANSETRON 4 MG PO TBDP: 4.0000 mg | ORAL_TABLET | Freq: Three times a day (TID) | ORAL | 0 refills | Status: AC | PRN

## 2022-05-18 MED ORDER — SODIUM CHLORIDE 0.9 % IV BOLUS
1000.0000 mL | Freq: Once | INTRAVENOUS | Status: AC
  Administered 2022-05-18: 1000 mL via INTRAVENOUS

## 2022-05-18 MED ORDER — NAPROXEN 500 MG PO TBEC: 500.0000 mg | DELAYED_RELEASE_TABLET | Freq: Two times a day (BID) | ORAL | 0 refills | Status: AC | PRN

## 2022-05-18 MED ORDER — METRONIDAZOLE 500 MG PO TABS: 500.0000 mg | ORAL_TABLET | Freq: Two times a day (BID) | ORAL | 0 refills | Status: AC

## 2022-05-18 MED ORDER — CEFTRIAXONE SODIUM 500 MG IJ SOLR
500.0000 mg | Freq: Once | INTRAMUSCULAR | Status: AC
  Administered 2022-05-18: 500 mg via INTRAMUSCULAR
  Filled 2022-05-18: qty 500

## 2022-05-18 MED ORDER — DOXYCYCLINE HYCLATE 100 MG PO CAPS: 100.0000 mg | ORAL_CAPSULE | Freq: Two times a day (BID) | ORAL | 0 refills | Status: AC

## 2022-05-18 NOTE — Discharge Instructions (Addendum)
You were seen in the emergency department today for abdominal pain.  Your blood work overall seemed similar to prior lab work you have had done.  Your pelvic ultrasound was normal.  The pelvic samples we did show findings of trichomonas which is an STD.  Given your abdominal/pelvic pain we are concerned that you have pelvic inflammatory disease, please see attached handout.   We are sending you home with antibiotics to treat for pelvic inflammatory disease, please take this as prescribed.  Do not drink alcohol with the Flagyl as it can have severe side effects.  We are also sending you home with naproxen for pain and Zofran for nausea. Naproxen- this is a nonsteroidal anti-inflammatory medication that will help with pain and swelling. Be sure to take this medication as prescribed with food, 1 pill every 12 hours,  It should be taken with food, as it can cause stomach upset, and more seriously, stomach bleeding. Do not take other nonsteroidal anti-inflammatory medications with this such as Advil, Motrin, Aleve, Mobic, Goodie Powder, or Motrin etc..    You make take Tylenol per over the counter dosing with these medications.   We have prescribed you new medication(s) today. Discuss the medications prescribed today with your pharmacist as they can have adverse effects and interactions with your other medicines including over the counter and prescribed medications. Seek medical evaluation if you start to experience new or abnormal symptoms after taking one of these medicines, seek care immediately if you start to experience difficulty breathing, feeling of your throat closing, facial swelling, or rash as these could be indications of a more serious allergic reaction   Do not have sex of any kind until least 1 week following her last dose of antibiotics as trichomonas is contagious.  We will call you if your gonorrhea, chlamydia, HIV, or syphilis test are negative.  We have treated you for possible  gonorrhea and chlamydia in the ER to cover for this.  Please follow-up with OB/GYN as soon as possible.  Return to the emergency department for new or worsening symptoms including but not limited to new or worsening pain, fever, inability to keep fluids down, passing out, or any other concerns.

## 2022-05-18 NOTE — ED Notes (Signed)
ED Provider at bedside. 

## 2022-05-18 NOTE — ED Notes (Signed)
Pelvic exam done by PA

## 2022-05-18 NOTE — ED Notes (Signed)
Pt called 3x no answer  

## 2022-05-18 NOTE — ED Provider Notes (Signed)
[facility] EMERGENCY DEPARTMENT Provider Note   CSN: [id number] Arrival date & time: 05/17/22  2013     History {Add pertinent medical, surgical, social history, OB history to HPI:1} Chief Complaint  Patient presents with   Abdominal Pain   Emesis    Body aches    Phyllis Phillips is a 31 y.o. female with a history of anxiety, anemia, depression, and gestational diabetes who presents to the ED with complaints of abdominal pain since yesterday. Patient reports waxing/waning pain to the lower abdomen/suprapubic region. Worse when up and moving, no alleviating factors. Having associated nausea, vomiting, diarrhea, vaginal bleeding, vaginal discharge, and some urinary frequency. Did have some leg cramps yesterday. LMP 08/13 however only lasted a day. Sexually active in a monogamous relationship w/o concern for STD. Denies fever, hematemesis, melena, chest pain, or dyspnea.     HPI     Home Medications Prior to Admission medications   Medication Sig Start Date End Date Taking? Authorizing Provider  albuterol (VENTOLIN HFA) 108 (90 Base) MCG/ACT inhaler Inhale 1-2 puffs into the lungs every 6 (six) hours as needed for wheezing or shortness of breath. 07/17/21   Raspet, Erin K, PA-C  escitalopram (LEXAPRO) 20 MG tablet Take 1 tablet (20 mg total) by mouth daily. 08/20/19   Venora Maples, MD  ferrous sulfate 325 (65 FE) MG tablet TAKE 1 TABLET (325 MG TOTAL) BY MOUTH 2 (TWO) TIMES DAILY WITH A MEAL. 04/16/19   Rhett Bannister S, DO  ibuprofen (ADVIL) 800 MG tablet Take 1 tablet (800 mg total) by mouth 3 (three) times daily. Patient not taking: No sig reported 01/21/19   Tamera Stands, DO  metroNIDAZOLE (FLAGYL) 500 MG tablet Take 1 tablet (500 mg total) by mouth 2 (two) times daily. 10/02/21   Merrilee Jansky, MD  predniSONE (DELTASONE) 20 MG tablet Take 2 tablets (40 mg total) by mouth daily with breakfast. 08/13/21   Bing Neighbors, FNP  senna-docusate  (SENOKOT-S) 8.6-50 MG tablet Take 2 tablets by mouth at bedtime as needed for mild constipation. 01/21/19   Tamera Stands, DO      Allergies    Latex    Review of Systems   Review of Systems  Constitutional:  Negative for chills and fever.  HENT:  Negative for congestion and ear pain.   Respiratory:  Negative for cough and shortness of breath.   Cardiovascular:  Negative for chest pain.  Gastrointestinal:  Positive for abdominal pain, diarrhea, nausea and vomiting.  Genitourinary:  Positive for vaginal discharge. Negative for dysuria and vaginal bleeding.  Musculoskeletal:  Positive for back pain and myalgias.  Neurological:  Negative for syncope.  All other systems reviewed and are negative.   Physical Exam Updated Vital Signs BP 133/78   Pulse 78   Temp 98 F (36.7 C)   Resp 17   SpO2 98%  Physical Exam Vitals and nursing note reviewed. Exam conducted with a chaperone present.  Constitutional:      General: She is not in acute distress.    Appearance: She is well-developed. She is not toxic-appearing.  HENT:     Head: Normocephalic and atraumatic.  Eyes:     General:        Right eye: No discharge.        Left eye: No discharge.     Conjunctiva/sclera: Conjunctivae normal.  Cardiovascular:     Rate and Rhythm: Normal rate and regular rhythm.  Pulmonary:  Effort: No respiratory distress.     Breath sounds: Normal breath sounds. No wheezing or rales.  Abdominal:     General: There is no distension.     Palpations: Abdomen is soft.     Tenderness: There is abdominal tenderness in the right lower quadrant, suprapubic area and left lower quadrant. There is no guarding or rebound.  Genitourinary:    Labia:        Right: No lesion.        Left: No lesion.      Cervix: Cervical motion tenderness and discharge present.     Adnexa:        Right: Tenderness present.        Left: Tenderness present.   Musculoskeletal:     Cervical back: Neck supple.  Skin:     General: Skin is warm and dry.  Neurological:     Mental Status: She is alert.     Comments: Clear speech.   Psychiatric:        Behavior: Behavior normal.     ED Results / Procedures / Treatments   Labs (all labs ordered are listed, but only abnormal results are displayed) Labs Reviewed  COMPREHENSIVE METABOLIC PANEL - Abnormal; Notable for the following components:      Result Value   Glucose, Bld 104 (*)    Calcium 8.7 (*)    Total Protein 5.9 (*)    Albumin 3.3 (*)    All other components within normal limits  CBC - Abnormal; Notable for the following components:   Hemoglobin 11.4 (*)    HCT 35.2 (*)    All other components within normal limits  WET PREP, GENITAL  LIPASE, BLOOD  URINALYSIS, ROUTINE W REFLEX MICROSCOPIC  I-STAT BETA HCG BLOOD, ED ([facility], [facility], [facility] ONLY)  GC/CHLAMYDIA PROBE AMP ([facility]) NOT AT [facility]    EKG None  Radiology US PELVIC COMPLETE W TRANSVAGINAL AND TORSION R/O  Result Date: 05/18/2022 CLINICAL DATA:  Pelvic pain EXAM: TRANSABDOMINAL AND TRANSVAGINAL ULTRASOUND OF PELVIS DOPPLER ULTRASOUND OF OVARIES TECHNIQUE: Both transabdominal and transvaginal ultrasound examinations of the pelvis were performed. Transabdominal technique was performed for global imaging of the pelvis including uterus, ovaries, adnexal regions, and pelvic cul-de-sac. It was necessary to proceed with endovaginal exam following the transabdominal exam to visualize the ovaries. Color and duplex Doppler ultrasound was utilized to evaluate blood flow to the ovaries. COMPARISON:  None similar FINDINGS: Uterus Measurements: 11 x 5 x 7 cm = volume: 200 mL. No fibroids or other mass visualized. Endometrium Thickness: 11 mm.  No focal abnormality visualized. Right ovary Measurements: 30 x 12 x 26 mm = volume: 5 mL. Normal appearance/no adnexal mass. Left ovary Measurements: 33 x 19 x 23 mm = volume: 8 mL. Normal appearance/no adnexal mass. Corpus luteum noted. Pulsed Doppler evaluation of both  ovaries demonstrates normal low-resistance arterial and venous waveforms. Other findings No abnormal free fluid. IMPRESSION: Normal pelvic ultrasound. Electronically Signed   By: Tiburcio Pea M.D.   On: 05/18/2022 06:34    Procedures Procedures  {Document cardiac monitor, telemetry assessment procedure when appropriate:1}  Medications Ordered in ED Medications  ondansetron (ZOFRAN-ODT) disintegrating tablet 4 mg (4 mg Oral Given 05/17/22 2132)  oxyCODONE-acetaminophen (PERCOCET/ROXICET) 5-325 MG per tablet 1 tablet (1 tablet Oral Given 05/17/22 2132)    ED Course/ Medical Decision Making/ A&P  Medical Decision Making Amount and/or Complexity of Data Reviewed Radiology: ordered.  Risk Prescription drug management.  Patient presents to the ED with complaints of abdominal pain, this involves an extensive number of treatment options, and is a complaint that carries with it a high risk of complications and morbidity. Nontoxic, vitals fairly unremarkable.    Ddx including but not limited to: Ovarian cyst, ovarian torsion, PID, ectopic pregnancy, IUP, appendicitis, UTI, pyelonephritis, nephrolithiasis  Additional history obtained:  Chart/nursing notes reviewed  Lab Tests:  I viewed & interpreted labs including:  CBC: mild anemia similar to prior.  CMP: mild hypoalbuminemia, no critical electrolyte derangement.  Lipase; WNL Preg test: Negative UA: not overlying consistent w/ UTI.  Wet prep: Findings consistent with trichomonas. GC/chlamydia/RPR/HIV: Pending  Imaging Studies:  I ordered and viewed the following imaging, agree with radiologist impression:   Pelvic US: Normal pelvic ultrasound.  ED Course:  I ordered medications including dilaudid for pain  On reassessment patient is feeling improved.  Clinically concern for pelvic inflammatory disease given positive for trichomonas on wet prep with diffuse bimanual exam tenderness including CMT.   Ultrasound without findings of tubo-ovarian abscess.  Pregnancy test is negative.  Labs overall fairly reassuring.  Discussed need to inform sexual partners.  Will treat with antibiotics and provide as needed medications.    Portions of this note were generated with [employer]. Dictation errors may occur despite best attempts at proofreading.   {Document critical care time when appropriate:1} {Document review of labs and clinical decision tools ie heart score, Chads2Vasc2 etc:1}  {Document your independent review of radiology images, and any outside records:1} {Document your discussion with family members, caretakers, and with consultants:1} {Document social determinants of health affecting pt's care:1} {Document your decision making why or why not admission, treatments were needed:1} Final Clinical Impression(s) / ED Diagnoses Final diagnoses:  None    Rx / DC Orders ED Discharge Orders     None

## 2022-05-18 NOTE — ED Notes (Signed)
Pt left came back in

## 2022-05-18 NOTE — ED Notes (Signed)
Patient transported to Ultrasound 

## 2022-05-19 LAB — RPR: RPR Ser Ql: NONREACTIVE

## 2022-10-07 ENCOUNTER — Ambulatory Visit (INDEPENDENT_AMBULATORY_CARE_PROVIDER_SITE_OTHER): Payer: Medicaid Other | Admitting: Nurse Practitioner

## 2022-10-07 ENCOUNTER — Encounter: Payer: Self-pay | Admitting: Nurse Practitioner

## 2022-10-07 VITALS — BP 130/80 | HR 79 | Ht 66.5 in | Wt 213.2 lb

## 2022-10-07 DIAGNOSIS — Z6833 Body mass index (BMI) 33.0-33.9, adult: Secondary | ICD-10-CM

## 2022-10-07 DIAGNOSIS — R0683 Snoring: Secondary | ICD-10-CM | POA: Diagnosis not present

## 2022-10-07 DIAGNOSIS — E6609 Other obesity due to excess calories: Secondary | ICD-10-CM | POA: Diagnosis not present

## 2022-10-07 DIAGNOSIS — G4719 Other hypersomnia: Secondary | ICD-10-CM | POA: Insufficient documentation

## 2022-10-07 DIAGNOSIS — G4710 Hypersomnia, unspecified: Secondary | ICD-10-CM | POA: Diagnosis not present

## 2022-10-07 DIAGNOSIS — E669 Obesity, unspecified: Secondary | ICD-10-CM | POA: Insufficient documentation

## 2022-10-07 NOTE — Assessment & Plan Note (Signed)
See above

## 2022-10-07 NOTE — Progress Notes (Signed)
Reviewed and agree with assessment/plan.   Dyamon Sosinski, MD [facility] 10/07/2022, 12:13 PM Pager:  [phone]  

## 2022-10-07 NOTE — Patient Instructions (Addendum)
Given your symptoms, I am concerned that you may have sleep disordered breathing with sleep apnea. You will need a sleep study for further evaluation. Someone will contact you for scheduling   We discussed how untreated sleep apnea puts an individual at risk for cardiac arrhthymias, pulm HTN, DM, stroke and increases their risk for daytime accidents. We also briefly reviewed treatment options including weight loss, side sleeping position, oral appliance, CPAP therapy or referral to ENT for possible surgical options  Use caution when driving and pull over if you become sleepy  Follow up after home sleep study with Phyllis Avril Busser,NP or sooner if needed

## 2022-10-07 NOTE — Progress Notes (Signed)
@Patient  ID: Phyllis Phillips, female    DOB: 03-31-1991, 32 y.o.   MRN: [id number]  Chief Complaint  Patient presents with   Consult    Sleep     Referring provider: Joycelyn Man, FNP  HPI: 32 year old female, former smoker referred for sleep consult. Past medical history significant for DM, asthma, obesity.   TEST/EVENTS:   10/07/2022: Today - sleep consult  Patient presents today for sleep consult, referred by Earnestine Leys, FNP.  She has had trouble with her sleep for many years now.  Feels very tired during the day.  Takes frequent naps; sometimes falls asleep without trying.  Wakes feeling poorly rested.  She also has trouble staying asleep at night.  No difficulty falling asleep.  She has been told that she snores.  Wakes up every morning with a very dry mouth.  She does okay driving around the city but if she is in the car for longer than an hour, tends to doze.  She has fallen asleep when driving longer distances in the past.  Denies any witnessed apneas, morning headaches, sleep parasomnia/paralysis.  No known history of narcolepsy.  No symptoms of cataplexy. Goes to bed around 8 PM.  Falls asleep quickly.  Wakes 3 or more times a night.  Officially wakes around 445 to 6 AM, depending on if she can sleep or not.  She will try to does until around 7 and then officially get out of bed.  Not working currently.  She has never had a sleep study before.  Weight has been stable over the last 2 years. She has a history of asthma and allergies.  No history of stroke.  Former smoker, quit in 2017.  Occasionally drinks a glass of wine.  No illicit drug use.  Lives at home with her children, 3, 5 and 12.  Family history of asthma and cancer.  Epworth 18   Allergies  Allergen Reactions   Latex Other (See Comments)    Causes burning.    Immunization History  Administered Date(s) Administered   Influenza,inj,Quad PF,6+ Mos 07/01/2018   Tdap 11/01/2018    Past Medical History:  Diagnosis  Date   Anemia    Anxiety    Asthma    weather, exercise related; does not use inhaler often   Depression    Gestational diabetes    Infection    UTI   Recurrent upper respiratory infection (URI)    Shingles     Tobacco History: Social History   Tobacco Use  Smoking Status Former   Years: 10.00   Types: Cigarettes   Quit date: 10/28/2015   Years since quitting: 6.9   Passive exposure: Never  Smokeless Tobacco Never   Counseling given: Not Answered   Outpatient Medications Prior to Visit  Medication Sig Dispense Refill   albuterol (VENTOLIN HFA) 108 (90 Base) MCG/ACT inhaler Inhale 1-2 puffs into the lungs every 6 (six) hours as needed for wheezing or shortness of breath. 8 g 0   doxycycline (VIBRAMYCIN) 100 MG capsule Take 1 capsule (100 mg total) by mouth 2 (two) times daily. 28 capsule 0   escitalopram (LEXAPRO) 20 MG tablet Take 1 tablet (20 mg total) by mouth daily. 30 tablet 5   metroNIDAZOLE (FLAGYL) 500 MG tablet Take 1 tablet (500 mg total) by mouth 2 (two) times daily. 28 tablet 0   naproxen (EC-NAPROSYN) 500 MG EC tablet Take 1 tablet (500 mg total) by mouth 2 (two) times daily as  needed. 15 tablet 0   ondansetron (ZOFRAN-ODT) 4 MG disintegrating tablet Take 1 tablet (4 mg total) by mouth every 8 (eight) hours as needed for nausea or vomiting. 10 tablet 0   predniSONE (DELTASONE) 20 MG tablet Take 2 tablets (40 mg total) by mouth daily with breakfast. (Patient not taking: Reported on 10/07/2022) 10 tablet 0   ferrous sulfate 325 (65 FE) MG tablet TAKE 1 TABLET (325 MG TOTAL) BY MOUTH 2 (TWO) TIMES DAILY WITH A MEAL. 60 tablet 0   senna-docusate (SENOKOT-S) 8.6-50 MG tablet Take 2 tablets by mouth at bedtime as needed for mild constipation. 20 tablet 0   No facility-administered medications prior to visit.     Review of Systems:   Constitutional: No weight loss or gain, night sweats, fevers, chills, or lassitude. +excessive daytime sleepiness HEENT: No headaches,  difficulty swallowing, tooth/dental problems, or sore throat. No sneezing, itching, ear ache, nasal congestion, or post nasal drip. +AM dry mouth CV:  No chest pain, orthopnea, PND, swelling in lower extremities, anasarca, dizziness, palpitations, syncope Resp: +snoring. No shortness of breath with exertion or at rest. No excess mucus or change in color of mucus. No productive or non-productive. No hemoptysis. No wheezing.  No chest wall deformity GI:  No heartburn, indigestion, abdominal pain, nausea, vomiting, diarrhea, change in bowel habits, loss of appetite GU: No dysuria, change in color of urine, urgency or frequency.   Skin: No rash, lesions, ulcerations MSK:  No joint pain or swelling.  Neuro: No dizziness or lightheadedness.  Psych: No depression or anxiety. Mood stable. +sleep disturbance    Physical Exam:  BP 130/80 (BP Location: Left Arm)   Pulse 79   Ht 5' 6.5" (1.689 m)   Wt 213 lb 3.2 oz (96.7 kg)   SpO2 98%   BMI 33.90 kg/m   GEN: Pleasant, interactive, well-appearing; obese; in no acute distress. HEENT:  Normocephalic and atraumatic. PERRLA. Sclera white. Nasal turbinates pink, moist and patent bilaterally. No rhinorrhea present. Oropharynx pink and moist, without exudate or edema. No lesions, ulcerations, or postnasal drip. Mallampati II NECK:  Supple w/ fair ROM. No JVD present. Normal carotid impulses w/o bruits. Thyroid symmetrical with no goiter or nodules palpated. No lymphadenopathy.   CV: RRR, no m/r/g, no peripheral edema. Pulses intact, +2 bilaterally. No cyanosis, pallor or clubbing. PULMONARY:  Unlabored, regular breathing. Clear bilaterally A&P w/o wheezes/rales/rhonchi. No accessory muscle use.  GI: BS present and normoactive. Soft, non-tender to palpation. No organomegaly or masses detected.  MSK: No erythema, warmth or tenderness. Cap refil <2 sec all extrem. No deformities or joint swelling noted.  Neuro: A/Ox3. No focal deficits noted.   Skin: Warm,  no lesions or rashe Psych: Normal affect and behavior. Judgement and thought content appropriate.     Lab Results:  CBC    Component Value Date/Time   WBC 8.0 05/17/2022 2130   RBC 4.23 05/17/2022 2130   HGB 11.4 (L) 05/17/2022 2130   HGB 12.2 07/18/2020 1619   HCT 35.2 (L) 05/17/2022 2130   HCT 37.9 07/18/2020 1619   PLT 238 05/17/2022 2130   PLT 267 07/18/2020 1619   MCV 83.2 05/17/2022 2130   MCV 84 07/18/2020 1619   MCH 27.0 05/17/2022 2130   MCHC 32.4 05/17/2022 2130   RDW 14.5 05/17/2022 2130   RDW 13.4 07/18/2020 1619   LYMPHSABS 2.9 07/18/2020 1619   MONOABS 0.4 05/31/2018 1620   EOSABS 0.1 07/18/2020 1619   BASOSABS 0.0 07/18/2020 1619  BMET    Component Value Date/Time   NA 138 05/17/2022 2130   K 3.6 05/17/2022 2130   CL 108 05/17/2022 2130   CO2 25 05/17/2022 2130   GLUCOSE 104 (H) 05/17/2022 2130   BUN 11 05/17/2022 2130   CREATININE 0.93 05/17/2022 2130   CALCIUM 8.7 (L) 05/17/2022 2130   GFRNONAA >60 05/17/2022 2130   GFRAA >60 01/20/2019 0719    BNP No results found for: "BNP"   Imaging:  No results found.        No data to display          No results found for: "NITRICOXIDE"      Assessment & Plan:   Excessive daytime sleepiness She has snoring, excessive daytime sleepiness, morning headaches, drowsy driving. BMI 33. Epworth 18. Given this,  I am concerned she could have sleep disordered breathing with obstructive sleep apnea. She will need sleep study for further evaluation. I am also concerned that with her excessive hypersomnolence, there may be a component of narcolepsy. She is higher probability for OSA so we will start with workup for this and if no evidence of OSA or she continues to have significant daytime symptoms with correction of OSA, will order MSLT.    - discussed how weight can impact sleep and risk for sleep disordered breathing - discussed options to assist with weight loss: combination of diet  modification, cardiovascular and strength training exercises   - had an extensive discussion regarding the adverse health consequences related to untreated sleep disordered breathing - specifically discussed the risks for hypertension, coronary artery disease, cardiac dysrhythmias, cerebrovascular disease, and diabetes - lifestyle modification discussed   - discussed how sleep disruption can increase risk of accidents, particularly when driving - safe driving practices were discussed  Patient Instructions  Given your symptoms, I am concerned that you may have sleep disordered breathing with sleep apnea. You will need a sleep study for further evaluation. Someone will contact you for scheduling   We discussed how untreated sleep apnea puts an individual at risk for cardiac arrhthymias, pulm HTN, DM, stroke and increases their risk for daytime accidents. We also briefly reviewed treatment options including weight loss, side sleeping position, oral appliance, CPAP therapy or referral to ENT for possible surgical options  Use caution when driving and pull over if you become sleepy  Follow up after home sleep study with Katie Chalyn Amescua,NP or sooner if needed   Loud snoring See above  Hypersomnia See above.  Obesity (BMI 30-39.9) BMI 33.9. healthy weight loss encouraged.    I spent 35 minutes of dedicated to the care of this patient on the date of this encounter to include pre-visit review of records, face-to-face time with the patient discussing conditions above, post visit ordering of testing, clinical documentation with the electronic health record, making appropriate referrals as documented, and communicating necessary findings to members of the patients care team.  Noemi Chapel, NP 10/07/2022  Pt aware and understands NP's role.

## 2022-10-07 NOTE — Assessment & Plan Note (Signed)
BMI 33.9. healthy weight loss encouraged.

## 2022-10-07 NOTE — Assessment & Plan Note (Signed)
She has snoring, excessive daytime sleepiness, morning headaches, drowsy driving. BMI 33. Epworth 18. Given this,  I am concerned she could have sleep disordered breathing with obstructive sleep apnea. She will need sleep study for further evaluation. I am also concerned that with her excessive hypersomnolence, there may be a component of narcolepsy. She is higher probability for OSA so we will start with workup for this and if no evidence of OSA or she continues to have significant daytime symptoms with correction of OSA, will order MSLT.    - discussed how weight can impact sleep and risk for sleep disordered breathing - discussed options to assist with weight loss: combination of diet modification, cardiovascular and strength training exercises   - had an extensive discussion regarding the adverse health consequences related to untreated sleep disordered breathing - specifically discussed the risks for hypertension, coronary artery disease, cardiac dysrhythmias, cerebrovascular disease, and diabetes - lifestyle modification discussed   - discussed how sleep disruption can increase risk of accidents, particularly when driving - safe driving practices were discussed  Patient Instructions  Given your symptoms, I am concerned that you may have sleep disordered breathing with sleep apnea. You will need a sleep study for further evaluation. Someone will contact you for scheduling   We discussed how untreated sleep apnea puts an individual at risk for cardiac arrhthymias, pulm HTN, DM, stroke and increases their risk for daytime accidents. We also briefly reviewed treatment options including weight loss, side sleeping position, oral appliance, CPAP therapy or referral to ENT for possible surgical options  Use caution when driving and pull over if you become sleepy  Follow up after home sleep study with Katie Lisbet Busker,NP or sooner if needed

## 2022-10-08 ENCOUNTER — Ambulatory Visit: Payer: Medicaid Other | Admitting: Pulmonary Disease

## 2022-10-08 DIAGNOSIS — G4733 Obstructive sleep apnea (adult) (pediatric): Secondary | ICD-10-CM

## 2022-10-08 DIAGNOSIS — G4719 Other hypersomnia: Secondary | ICD-10-CM

## 2022-10-08 DIAGNOSIS — E6609 Other obesity due to excess calories: Secondary | ICD-10-CM

## 2022-10-27 DIAGNOSIS — G4733 Obstructive sleep apnea (adult) (pediatric): Secondary | ICD-10-CM | POA: Diagnosis not present

## 2022-10-27 NOTE — Progress Notes (Signed)
Please notify patient that her sleep study revealed moderate OSA with AHI 13.5/h and SpO2 low 78%. Schedule follow up to discuss treatment options. Thanks.

## 2022-11-12 ENCOUNTER — Encounter: Payer: Self-pay | Admitting: Nurse Practitioner

## 2022-11-12 ENCOUNTER — Telehealth (INDEPENDENT_AMBULATORY_CARE_PROVIDER_SITE_OTHER): Payer: Medicaid Other | Admitting: Nurse Practitioner

## 2022-11-12 DIAGNOSIS — R0683 Snoring: Secondary | ICD-10-CM

## 2022-11-12 DIAGNOSIS — G4733 Obstructive sleep apnea (adult) (pediatric): Secondary | ICD-10-CM | POA: Diagnosis not present

## 2022-11-12 DIAGNOSIS — E669 Obesity, unspecified: Secondary | ICD-10-CM | POA: Diagnosis not present

## 2022-11-12 NOTE — Progress Notes (Signed)
Patient ID: Phyllis Phillips, female     DOB: 1991-07-29, 32 y.o.      MRN: [id number]  No chief complaint on file.   Virtual Visit via Video Note  I connected with [name] on 11/12/22 at  9:30 AM EST by a video enabled telemedicine application and verified that I am speaking with the correct person using two identifiers.  Location: Patient: Home Provider: Office   I discussed the limitations of evaluation and management by telemedicine and the availability of in person appointments. The patient expressed understanding and agreed to proceed.  History of Present Illness: 32 year old female, former smoker referred for sleep consult 10/07/2022.  Past medical history significant for DM, asthma, obesity.  TESTS/EVENTS: 10/09/2022 HST: AHI 13.5/h, SpO2 low 78%. Mild to moderate OSA  10/07/2022: OV with Phyllis Kettering NP for sleep consult.  Has had trouble with her sleep for many years.  Feels tired during the day.  Takes frequent naps; sometimes falls asleep without trying.  Wakes feeling poorly rested.  Also has trouble staying asleep at night.  No difficulty falling asleep.  Has been told that she snores.  Wakes up every morning with a very dry mouth.  She does okay driving around the city but if she is in the car for longer than an hour, tends to doze.  She has fallen asleep when driving longer distances in the past.  Denies any witnessed apneas, morning headaches, sleep parasomnia/paralysis.  No known history of narcolepsy.  No symptoms of cataplexy. Goes to bed around 8 PM.  Falls asleep quickly.  Wakes 3 or more times a night.  Officially gets up around 440 5 to 6 AM, depending on if she can sleep or not.  She will tried to do does until around 7 and then officially get out of bed.  Not working currently.  She has never had a sleep study before.  Weight has been stable over the last 2 years.   She has a history of asthma and allergies.  No history of stroke.  Former smoker.  Quit in 2017.   Occasionally drinks a glass of wine.  No illicit drug use.  Lives at home with her children, 3, 5 and 12.  Family history of asthma and cancer. Epworth 18  11/12/2022: Today-follow-up Patient presents today via virtual visit to review recent home sleep study which revealed mild to moderate OSA.  She continues to have trouble with sleep at night and feeling very tired during the day.  Symptoms are overall unchanged compared to when she was here last.  She continues to deny any witnessed apneas, morning headaches, sleep parasomnia/paralysis.  She would like to review treatment options for her sleep apnea.  Allergies  Allergen Reactions   Latex Other (See Comments)    Causes burning.   Immunization History  Administered Date(s) Administered   Influenza,inj,Quad PF,6+ Mos 07/01/2018   Tdap 11/01/2018   Past Medical History:  Diagnosis Date   Anemia    Anxiety    Asthma    weather, exercise related; does not use inhaler often   Depression    Gestational diabetes    Infection    UTI   Recurrent upper respiratory infection (URI)    Shingles     Tobacco History: Social History   Tobacco Use  Smoking Status Former   Years: 10.00   Types: Cigarettes   Quit date: 10/28/2015   Years since quitting: 7.0   Passive exposure: Never  Smokeless Tobacco Never  Counseling given: Not Answered   Outpatient Medications Prior to Visit  Medication Sig Dispense Refill   albuterol (VENTOLIN HFA) 108 (90 Base) MCG/ACT inhaler Inhale 1-2 puffs into the lungs every 6 (six) hours as needed for wheezing or shortness of breath. 8 g 0   doxycycline (VIBRAMYCIN) 100 MG capsule Take 1 capsule (100 mg total) by mouth 2 (two) times daily. 28 capsule 0   escitalopram (LEXAPRO) 20 MG tablet Take 1 tablet (20 mg total) by mouth daily. 30 tablet 5   metroNIDAZOLE (FLAGYL) 500 MG tablet Take 1 tablet (500 mg total) by mouth 2 (two) times daily. 28 tablet 0   naproxen (EC-NAPROSYN) 500 MG EC tablet Take 1  tablet (500 mg total) by mouth 2 (two) times daily as needed. 15 tablet 0   ondansetron (ZOFRAN-ODT) 4 MG disintegrating tablet Take 1 tablet (4 mg total) by mouth every 8 (eight) hours as needed for nausea or vomiting. 10 tablet 0   predniSONE (DELTASONE) 20 MG tablet Take 2 tablets (40 mg total) by mouth daily with breakfast. (Patient not taking: Reported on 10/07/2022) 10 tablet 0   No facility-administered medications prior to visit.     Review of Systems:   Constitutional: No weight loss or gain, night sweats, fevers, chills, or lassitude. +excessive daytime sleepiness HEENT: No headaches, difficulty swallowing, tooth/dental problems, or sore throat. No sneezing, itching, ear ache, nasal congestion, or post nasal drip. +AM dry mouth CV:  No chest pain, orthopnea, PND, swelling in lower extremities, anasarca, dizziness, palpitations, syncope Resp: +snoring. No shortness of breath with exertion or at rest. No excess mucus or change in color of mucus. No productive or non-productive. No hemoptysis. No wheezing.  No chest wall deformity GI:  No heartburn, indigestion, abdominal pain, nausea, vomiting, diarrhea, change in bowel habits, loss of appetite GU: No dysuria, change in color of urine, urgency or frequency.   Skin: No rash, lesions, ulcerations MSK:  No joint pain or swelling.  Neuro: No dizziness or lightheadedness.  Psych: No depression or anxiety. Mood stable. +sleep disturbance  Observations/Objective: Patient is well-developed, well-nourished in no acute distress. A&Ox3. Resting comfortably at home. Unlabored breathing. Speech is clear and coherent with logical content.   Assessment and Plan: Obstructive sleep apnea Mild to moderate OSA with AHI 13.5/h and SpO2 low 78% on HST.  We reviewed risks of untreated sleep apnea and potential treatment options.  Given her significant daytime symptoms, she would like to move forward with CPAP therapy.  Will start her on auto setting of  5-15 cmH2O, mask of choice and heated humidification.  Orders were placed today.  Educated on proper use and care of device.  Risk/benefits reviewed.  All questions answered.  Cautioned on safe driving practices.  We will monitor her daytime sleepiness symptoms and insomnia symptoms with treatment of CPAP and determine if further pharmacological therapy is warranted.  Patient Instructions  Start CPAP 5-15 cmH2O every night, minimum of 4-6 hours a night.  Change equipment every 30 days or as directed by DME. Wash your tubing with warm soap and water daily, hang to dry. Wash humidifier portion weekly.  Be aware of reduced alertness and do not drive or operate heavy machinery if experiencing this or drowsiness.  Exercise encouraged, as tolerated. Notify if persistent daytime sleepiness occurs even with consistent use of CPAP.  We discussed how untreated sleep apnea puts an individual at risk for cardiac arrhthymias, pulm HTN, DM, stroke and increases their risk for daytime accidents. We  also briefly reviewed treatment options including weight loss, side sleeping position, oral appliance, CPAP therapy or referral to ENT for possible surgical options  Work on healthy weight loss measures  Follow-up in 12 weeks with Dr. Halford Chessman or Roxan Diesel, NP, or sooner if needed   Obesity (BMI 30-39.9) Reviewed correlation between obesity and OSA.  Healthy weight loss measures encouraged.    I discussed the assessment and treatment plan with the patient. The patient was provided an opportunity to ask questions and all were answered. The patient agreed with the plan and demonstrated an understanding of the instructions.   The patient was advised to call back or seek an in-person evaluation if the symptoms worsen or if the condition fails to improve as anticipated.  I provided 31 minutes of non-face-to-face time during this encounter.   Clayton Bibles, NP

## 2022-11-12 NOTE — Assessment & Plan Note (Addendum)
Mild to moderate OSA with AHI 13.5/h and SpO2 low 78% on HST.  We reviewed risks of untreated sleep apnea and potential treatment options.  Given her significant daytime symptoms, she would like to move forward with CPAP therapy.  Will start her on auto setting of 5-15 cmH2O, mask of choice and heated humidification.  Orders were placed today.  Educated on proper use and care of device.  Risk/benefits reviewed.  All questions answered.  Cautioned on safe driving practices.  We will monitor her daytime sleepiness symptoms and insomnia symptoms with treatment of CPAP and determine if further pharmacological therapy is warranted.  Patient Instructions  Start CPAP 5-15 cmH2O every night, minimum of 4-6 hours a night.  Change equipment every 30 days or as directed by DME. Wash your tubing with warm soap and water daily, hang to dry. Wash humidifier portion weekly.  Be aware of reduced alertness and do not drive or operate heavy machinery if experiencing this or drowsiness.  Exercise encouraged, as tolerated. Notify if persistent daytime sleepiness occurs even with consistent use of CPAP.  We discussed how untreated sleep apnea puts an individual at risk for cardiac arrhthymias, pulm HTN, DM, stroke and increases their risk for daytime accidents. We also briefly reviewed treatment options including weight loss, side sleeping position, oral appliance, CPAP therapy or referral to ENT for possible surgical options  Work on healthy weight loss measures  Follow-up in 12 weeks with Dr. Halford Chessman or Roxan Diesel, NP, or sooner if needed

## 2022-11-12 NOTE — Assessment & Plan Note (Signed)
Reviewed correlation between obesity and OSA.  Healthy weight loss measures encouraged.

## 2022-11-12 NOTE — Patient Instructions (Signed)
Start CPAP 5-15 cmH2O every night, minimum of 4-6 hours a night.  Change equipment every 30 days or as directed by DME. Wash your tubing with warm soap and water daily, hang to dry. Wash humidifier portion weekly.  Be aware of reduced alertness and do not drive or operate heavy machinery if experiencing this or drowsiness.  Exercise encouraged, as tolerated. Notify if persistent daytime sleepiness occurs even with consistent use of CPAP.  We discussed how untreated sleep apnea puts an individual at risk for cardiac arrhthymias, pulm HTN, DM, stroke and increases their risk for daytime accidents. We also briefly reviewed treatment options including weight loss, side sleeping position, oral appliance, CPAP therapy or referral to ENT for possible surgical options  Work on healthy weight loss measures  Follow-up in 12 weeks with Dr. Halford Chessman or Roxan Diesel, NP, or sooner if needed

## 2022-11-12 NOTE — Progress Notes (Signed)
Reviewed and agree with assessment/plan.   Chesley Mires, MD [facility] Pulmonary/Critical Care 11/12/2022, 3:16 PM Pager:  [phone]
